# Patient Record
Sex: Female | Born: 1980 | Race: Black or African American | Hispanic: No | Marital: Single | Smoking: Former smoker
Health system: Southern US, Community
[De-identification: ages and names within clinical notes are randomized; demographics above are authoritative.]

## PROBLEM LIST (undated history)

## (undated) ENCOUNTER — Inpatient Hospital Stay (HOSPITAL_COMMUNITY): Payer: Self-pay

## (undated) ENCOUNTER — Ambulatory Visit (HOSPITAL_COMMUNITY): Admission: EM | Payer: Medicaid Other | Source: Home / Self Care

## (undated) DIAGNOSIS — A749 Chlamydial infection, unspecified: Secondary | ICD-10-CM

## (undated) DIAGNOSIS — Z8619 Personal history of other infectious and parasitic diseases: Secondary | ICD-10-CM

## (undated) DIAGNOSIS — N200 Calculus of kidney: Secondary | ICD-10-CM

## (undated) DIAGNOSIS — O139 Gestational [pregnancy-induced] hypertension without significant proteinuria, unspecified trimester: Secondary | ICD-10-CM

## (undated) DIAGNOSIS — N871 Moderate cervical dysplasia: Secondary | ICD-10-CM

## (undated) DIAGNOSIS — IMO0002 Reserved for concepts with insufficient information to code with codable children: Secondary | ICD-10-CM

## (undated) DIAGNOSIS — F419 Anxiety disorder, unspecified: Secondary | ICD-10-CM

## (undated) DIAGNOSIS — K219 Gastro-esophageal reflux disease without esophagitis: Secondary | ICD-10-CM

## (undated) DIAGNOSIS — A5901 Trichomonal vulvovaginitis: Secondary | ICD-10-CM

## (undated) DIAGNOSIS — D649 Anemia, unspecified: Secondary | ICD-10-CM

## (undated) DIAGNOSIS — A599 Trichomoniasis, unspecified: Secondary | ICD-10-CM

## (undated) DIAGNOSIS — N390 Urinary tract infection, site not specified: Secondary | ICD-10-CM

## (undated) HISTORY — PX: INDUCED ABORTION: SHX677

## (undated) HISTORY — PX: COLPOSCOPY: SHX161

## (undated) HISTORY — PX: WISDOM TOOTH EXTRACTION: SHX21

---
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
## 2001-02-06 ENCOUNTER — Emergency Department (HOSPITAL_COMMUNITY): Admission: EM | Admit: 2001-02-06 | Discharge: 2001-02-06 | Payer: Self-pay | Admitting: Emergency Medicine

## 2001-03-10 ENCOUNTER — Inpatient Hospital Stay (HOSPITAL_COMMUNITY): Admission: AD | Admit: 2001-03-10 | Discharge: 2001-03-10 | Payer: Self-pay | Admitting: *Deleted

## 2001-03-19 ENCOUNTER — Inpatient Hospital Stay (HOSPITAL_COMMUNITY): Admission: AD | Admit: 2001-03-19 | Discharge: 2001-03-19 | Payer: Self-pay | Admitting: *Deleted

## 2001-07-01 ENCOUNTER — Inpatient Hospital Stay (HOSPITAL_COMMUNITY): Admission: AD | Admit: 2001-07-01 | Discharge: 2001-07-01 | Payer: Self-pay | Admitting: Obstetrics and Gynecology

## 2001-07-16 ENCOUNTER — Inpatient Hospital Stay (HOSPITAL_COMMUNITY): Admission: AD | Admit: 2001-07-16 | Discharge: 2001-07-16 | Payer: Self-pay | Admitting: *Deleted

## 2001-10-14 ENCOUNTER — Inpatient Hospital Stay (HOSPITAL_COMMUNITY): Admission: AD | Admit: 2001-10-14 | Discharge: 2001-10-14 | Payer: Self-pay | Admitting: *Deleted

## 2002-01-22 ENCOUNTER — Inpatient Hospital Stay (HOSPITAL_COMMUNITY): Admission: AD | Admit: 2002-01-22 | Discharge: 2002-01-22 | Payer: Self-pay | Admitting: Obstetrics and Gynecology

## 2002-03-20 ENCOUNTER — Inpatient Hospital Stay (HOSPITAL_COMMUNITY): Admission: AD | Admit: 2002-03-20 | Discharge: 2002-03-20 | Payer: Self-pay | Admitting: Family Medicine

## 2002-10-24 ENCOUNTER — Inpatient Hospital Stay (HOSPITAL_COMMUNITY): Admission: AD | Admit: 2002-10-24 | Discharge: 2002-10-24 | Payer: Self-pay | Admitting: Family Medicine

## 2002-10-26 ENCOUNTER — Inpatient Hospital Stay (HOSPITAL_COMMUNITY): Admission: AD | Admit: 2002-10-26 | Discharge: 2002-10-26 | Payer: Self-pay | Admitting: *Deleted

## 2002-10-28 ENCOUNTER — Inpatient Hospital Stay (HOSPITAL_COMMUNITY): Admission: AD | Admit: 2002-10-28 | Discharge: 2002-10-28 | Payer: Self-pay | Admitting: *Deleted

## 2003-03-26 ENCOUNTER — Emergency Department (HOSPITAL_COMMUNITY): Admission: EM | Admit: 2003-03-26 | Discharge: 2003-03-26 | Payer: Self-pay | Admitting: Emergency Medicine

## 2003-08-03 ENCOUNTER — Inpatient Hospital Stay (HOSPITAL_COMMUNITY): Admission: AD | Admit: 2003-08-03 | Discharge: 2003-08-03 | Payer: Self-pay | Admitting: Obstetrics and Gynecology

## 2004-06-27 ENCOUNTER — Inpatient Hospital Stay (HOSPITAL_COMMUNITY): Admission: AD | Admit: 2004-06-27 | Discharge: 2004-06-28 | Payer: Self-pay | Admitting: Obstetrics and Gynecology

## 2004-06-30 ENCOUNTER — Ambulatory Visit: Payer: Self-pay | Admitting: Obstetrics & Gynecology

## 2004-07-04 ENCOUNTER — Ambulatory Visit: Payer: Self-pay | Admitting: Family Medicine

## 2004-07-04 ENCOUNTER — Inpatient Hospital Stay (HOSPITAL_COMMUNITY): Admission: AD | Admit: 2004-07-04 | Discharge: 2004-07-06 | Payer: Self-pay | Admitting: *Deleted

## 2005-10-27 ENCOUNTER — Ambulatory Visit: Payer: Self-pay | Admitting: Gynecology

## 2005-10-27 ENCOUNTER — Inpatient Hospital Stay (HOSPITAL_COMMUNITY): Admission: AD | Admit: 2005-10-27 | Discharge: 2005-10-28 | Payer: Self-pay | Admitting: Gynecology

## 2005-10-28 ENCOUNTER — Ambulatory Visit: Payer: Self-pay | Admitting: Gynecology

## 2005-10-28 ENCOUNTER — Inpatient Hospital Stay (HOSPITAL_COMMUNITY): Admission: AD | Admit: 2005-10-28 | Discharge: 2005-10-30 | Payer: Self-pay | Admitting: Gynecology

## 2010-08-22 ENCOUNTER — Inpatient Hospital Stay (HOSPITAL_COMMUNITY)
Admission: AD | Admit: 2010-08-22 | Discharge: 2010-08-22 | Disposition: A | Discharge status: Home Health | Payer: Self-pay | Source: Ambulatory Visit | Attending: Obstetrics & Gynecology | Admitting: Obstetrics & Gynecology

## 2010-08-22 ENCOUNTER — Encounter (HOSPITAL_COMMUNITY): Payer: Self-pay | Admitting: *Deleted

## 2010-08-22 ENCOUNTER — Inpatient Hospital Stay (HOSPITAL_COMMUNITY): Payer: Self-pay

## 2010-08-22 DIAGNOSIS — N200 Calculus of kidney: Secondary | ICD-10-CM

## 2010-08-22 DIAGNOSIS — R10A1 Flank pain, right side: Secondary | ICD-10-CM

## 2010-08-22 DIAGNOSIS — R1031 Right lower quadrant pain: Secondary | ICD-10-CM

## 2010-08-22 DIAGNOSIS — R109 Unspecified abdominal pain: Secondary | ICD-10-CM

## 2010-08-22 DIAGNOSIS — N923 Ovulation bleeding: Secondary | ICD-10-CM

## 2010-08-22 DIAGNOSIS — N949 Unspecified condition associated with female genital organs and menstrual cycle: Secondary | ICD-10-CM | POA: Insufficient documentation

## 2010-08-22 DIAGNOSIS — N920 Excessive and frequent menstruation with regular cycle: Secondary | ICD-10-CM

## 2010-08-22 DIAGNOSIS — N938 Other specified abnormal uterine and vaginal bleeding: Secondary | ICD-10-CM | POA: Insufficient documentation

## 2010-08-22 HISTORY — DX: Calculus of kidney: N20.0

## 2010-08-22 LAB — URINALYSIS, ROUTINE W REFLEX MICROSCOPIC
Glucose, UA: NEGATIVE mg/dL
Ketones, ur: NEGATIVE mg/dL
Leukocytes, UA: NEGATIVE
Nitrite: NEGATIVE
Protein, ur: NEGATIVE mg/dL
pH: 7 (ref 5.0–8.0)

## 2010-08-22 LAB — WET PREP, GENITAL: Trich, Wet Prep: NONE SEEN

## 2010-08-22 LAB — URINE MICROSCOPIC-ADD ON

## 2010-08-22 LAB — POCT PREGNANCY, URINE: Preg Test, Ur: NEGATIVE

## 2010-08-22 MED ORDER — OXYCODONE-ACETAMINOPHEN 5-325 MG PO TABS: 1.0000 | ORAL_TABLET | ORAL | Status: AC | PRN

## 2010-08-22 MED ORDER — IBUPROFEN 600 MG PO TABS: 600.0000 mg | ORAL_TABLET | Freq: Four times a day (QID) | ORAL | Status: AC | PRN

## 2010-08-22 MED ORDER — IBUPROFEN 800 MG PO TABS
800.0000 mg | ORAL_TABLET | Freq: Once | ORAL | Status: AC
  Administered 2010-08-22: 800 mg via ORAL
  Filled 2010-08-22: qty 1

## 2010-08-22 NOTE — Progress Notes (Deleted)
Been having really bad abd pain since around 5pm yesterday. Constant. Sharp. Thinks she is 11 wks preg.  [facility])  G1

## 2010-08-22 NOTE — ED Notes (Signed)
Neg CVA tenderness. 

## 2010-08-22 NOTE — Progress Notes (Signed)
Came off period, has been spotting.  Has low back pain and is peeing alot

## 2010-08-22 NOTE — Progress Notes (Signed)
Nauseous in the morning

## 2010-08-22 NOTE — ED Provider Notes (Signed)
History   The pt is a 30 year-old female who presents to [facility] reporting intermenstrual spotting for the second consecutive month and right flank pain, hematuria, frequency and low adb pain x a few days.  She rates the pain 6/10 at it's worst and is requesting pain medication. She has a Hx of Kidney stones and LEEP. Last Pap ~1 year ago normal per pt. She is sexually active, denies vaginal discharge other that the spotting and denies fever, chills.  CSN: [id number] Arrival date & time: 08/22/2010  3:59 PM  Chief Complaint  Patient presents with  . Vaginal Bleeding   HPI  Past Medical History  Diagnosis Date  . Kidney stone     Past Surgical History  Procedure Date  . No past surgeries     No family history on file.  History  Substance Use Topics  . Smoking status: Never Smoker   . Smokeless tobacco: Not on file  . Alcohol Use: Yes    OB History    Grav Para Term Preterm Abortions TAB SAB Ect Mult Living   5 3 0 3 2 1  1         Review of Systems  All other systems reviewed and are negative.    Physical Exam  BP 135/79  Pulse 89  Temp(Src) 97.9 F (36.6 C) (Oral)  Resp 20  Ht 5\' 7"  (1.702 m)  Wt 80.74 kg (178 lb)  BMI 27.88 kg/m2  Physical Exam  Constitutional: She is oriented to person, place, and time. She appears well-developed and well-nourished. No distress.  Cardiovascular: Normal rate.   Pulmonary/Chest: Effort normal.  Abdominal: Soft. Bowel sounds are normal. She exhibits no distension and no mass. There is tenderness (mild, generalized). There is no rebound, no guarding and no CVA tenderness.  Genitourinary: Uterus is not enlarged and not tender. Cervix exhibits no motion tenderness, no discharge and no friability. Right adnexum displays no mass, no tenderness and no fullness. Left adnexum displays no mass, no tenderness and no fullness. There is bleeding (scant pink) around the vagina. No erythema or tenderness around the vagina. No vaginal discharge  found.  Neurological: She is alert and oriented to person, place, and time.  Skin: Skin is warm and dry.  Psychiatric: She has a normal mood and affect.   Results for orders placed during the hospital encounter of 08/22/10 (from the past 48 hour(s))  URINALYSIS, ROUTINE W REFLEX MICROSCOPIC     Status: Abnormal   Collection Time   08/22/10  5:07 PM      Component Value Range Comment   Color, Urine YELLOW  YELLOW     Appearance HAZY (*) CLEAR     Specific Gravity, Urine 1.020  1.005 - 1.030     pH 7.0  5.0 - 8.0     Glucose, UA NEGATIVE  NEGATIVE (mg/dL)    Hgb urine dipstick LARGE (*) NEGATIVE     Bilirubin Urine NEGATIVE  NEGATIVE     Ketones, ur NEGATIVE  NEGATIVE (mg/dL)    Protein, ur NEGATIVE  NEGATIVE (mg/dL)    Urobilinogen, UA 1.0  0.0 - 1.0 (mg/dL)    Nitrite NEGATIVE  NEGATIVE     Leukocytes, UA NEGATIVE  NEGATIVE    URINE MICROSCOPIC-ADD ON     Status: Abnormal   Collection Time   08/22/10  5:07 PM      Component Value Range Comment   Squamous Epithelial / LPF FEW (*) RARE     WBC, UA  0-2  <3 (WBC/hpf)    Urine-Other AMORPHOUS URATES/PHOSPHATES     POCT PREGNANCY, URINE     Status: Normal   Collection Time   08/22/10  5:13 PM      Component Value Range Comment   Preg Test, Ur NEGATIVE     POCT PREGNANCY, URINE     Status: Normal   Collection Time   08/22/10  7:46 PM      Component Value Range Comment   Preg Test, Ur NEGATIVE     WET PREP, GENITAL     Status: Abnormal   Collection Time   08/22/10  8:05 PM      Component Value Range Comment   Yeast, Wet Prep NONE SEEN  NONE SEEN     Trich, Wet Prep NONE SEEN  NONE SEEN     Clue Cells, Wet Prep FEW (*) NONE SEEN     WBC, Wet Prep HPF POC RARE (*) NONE SEEN  FEW BACTERIA SEEN  Pelvic US: Normal Renal US: No stone or hydronephrosis. No right ureteral jet seen. Otherwise normal.  ED Course  Procedures Assessment: 1. Possible kidney stones vs UTI 2. Spotting of unk etiology  Plan: 1. Urine Culture 2. D/C home  per consult w/ Dr. Penne Lash 3. Strain Urine 4. Rx Ibuprofen 600 PO Q6 PRN and Percocet 5/325 1 PO Q4 PRN #20 5. F/u at [facility] for severe flank pain, fever, chills 6. Increase water intake 7. Initiate care w/ gyn provider to continue eval of spotting. List given.

## 2010-08-22 NOTE — Progress Notes (Signed)
C/o right flank pain that is constant, has a hx of kidney stones.

## 2010-08-23 LAB — URINE CULTURE: Colony Count: 50000

## 2010-08-23 LAB — GC/CHLAMYDIA PROBE AMP, GENITAL
Chlamydia, DNA Probe: NEGATIVE
GC Probe Amp, Genital: NEGATIVE

## 2010-08-23 NOTE — ED Provider Notes (Signed)
Agree with above note.  Carmen Johnston H. 08/23/2010 6:32 AM

## 2010-12-02 ENCOUNTER — Encounter (HOSPITAL_COMMUNITY): Payer: Self-pay | Admitting: *Deleted

## 2010-12-02 ENCOUNTER — Emergency Department (HOSPITAL_COMMUNITY)
Admission: EM | Admit: 2010-12-02 | Discharge: 2010-12-03 | Disposition: A | Discharge status: Home / Self Care | Payer: Self-pay | Attending: Emergency Medicine | Admitting: Emergency Medicine

## 2010-12-02 DIAGNOSIS — IMO0001 Reserved for inherently not codable concepts without codable children: Secondary | ICD-10-CM | POA: Insufficient documentation

## 2010-12-02 DIAGNOSIS — R509 Fever, unspecified: Secondary | ICD-10-CM | POA: Insufficient documentation

## 2010-12-02 DIAGNOSIS — R6889 Other general symptoms and signs: Secondary | ICD-10-CM | POA: Insufficient documentation

## 2010-12-02 DIAGNOSIS — R05 Cough: Secondary | ICD-10-CM | POA: Insufficient documentation

## 2010-12-02 DIAGNOSIS — R51 Headache: Secondary | ICD-10-CM | POA: Insufficient documentation

## 2010-12-02 DIAGNOSIS — R062 Wheezing: Secondary | ICD-10-CM | POA: Insufficient documentation

## 2010-12-02 DIAGNOSIS — R059 Cough, unspecified: Secondary | ICD-10-CM | POA: Insufficient documentation

## 2010-12-02 DIAGNOSIS — R070 Pain in throat: Secondary | ICD-10-CM | POA: Insufficient documentation

## 2010-12-02 DIAGNOSIS — R110 Nausea: Secondary | ICD-10-CM | POA: Insufficient documentation

## 2010-12-02 NOTE — ED Notes (Signed)
The pt has had chills elevated temp headache and body aches since yesterday

## 2010-12-03 MED ORDER — IBUPROFEN 800 MG PO TABS: 800.0000 mg | ORAL_TABLET | Freq: Once | ORAL | Status: DC

## 2010-12-03 NOTE — ED Provider Notes (Signed)
Medical screening examination/treatment/procedure(s) were performed by non-physician practitioner and as supervising physician I was immediately available for consultation/collaboration.  Doug Sou, MD 12/03/10 [phone]

## 2010-12-03 NOTE — ED Notes (Signed)
Pt states she is one month pregnant. She states she doesn't want to take ibuprofen because she just took nyquil in her room. ARNP notified.

## 2010-12-03 NOTE — ED Provider Notes (Signed)
History     CSN: [id number] Arrival date & time: 12/02/2010 11:41 PM   First MD Initiated Contact with Patient 12/03/10 0138      Chief Complaint  Patient presents with  . Fever    (Consider location/radiation/quality/duration/timing/severity/associated sxs/prior treatment) HPI Comments: Acute onset yesterday of myalgias, headache, sore throat, runny nose, low-grade fever.  Denies nausea or vomiting, diarrhea, dysuria  Patient is a 30 y.o. female presenting with fever. The history is provided by the patient.  Fever Primary symptoms of the febrile illness include fever, headaches, cough, wheezing, nausea and myalgias. Primary symptoms do not include shortness of breath, vomiting, diarrhea or dysuria. The current episode started yesterday. This is a new problem. The problem has not changed since onset.   Past Medical History  Diagnosis Date  . Kidney stone     Past Surgical History  Procedure Date  . No past surgeries     History reviewed. No pertinent family history.  History  Substance Use Topics  . Smoking status: Never Smoker   . Smokeless tobacco: Not on file  . Alcohol Use: Yes    OB History    Grav Para Term Preterm Abortions TAB SAB Ect Mult Living   5 3 0 3 2 1  1         Review of Systems  Constitutional: Positive for fever.  HENT: Negative for congestion and rhinorrhea.   Eyes: Negative.   Respiratory: Positive for cough and wheezing. Negative for shortness of breath.   Gastrointestinal: Positive for nausea. Negative for vomiting and diarrhea.  Genitourinary: Negative.  Negative for dysuria.  Musculoskeletal: Positive for myalgias.  Skin: Negative.   Neurological: Positive for headaches.  Hematological: Negative.   Psychiatric/Behavioral: Negative.     Allergies  Review of patient's allergies indicates no known allergies.  Home Medications   Current Outpatient Rx  Name Route Sig Dispense Refill  . PSEUDOEPH-DOXYLAMINE-DM-APAP [id number]  MG/30ML PO LIQD Oral Take 15 mLs by mouth every 6 (six) hours as needed. For cold symptoms       BP 133/85  Pulse 96  Temp(Src) 101.3 F (38.5 C) (Oral)  Resp 19  SpO2 98%  LMP 11/01/2010  Physical Exam  Constitutional: She is oriented to person, place, and time. She appears well-developed and well-nourished.  HENT:  Head: Normocephalic.  Eyes: EOM are normal.  Neck: Neck supple. No tracheal deviation present.  Cardiovascular: Tachycardia present.   Pulmonary/Chest: Effort normal.  Abdominal: Bowel sounds are normal.  Musculoskeletal: Normal range of motion.  Neurological: She is oriented to person, place, and time.  Skin: Skin is warm and dry.  Psychiatric: She has a normal mood and affect.    ED Course  Procedures (including critical care time)  Labs Reviewed - No data to display No results found.   1. Flu-like symptoms       MDM  Future acute onset of symptoms.  This is most likely a flulike illness at time of discharge.  Patient informs me she is [redacted] weeks pregnant.        Arman Filter, NP 12/03/10 [id number]  Arman Filter, NP 12/03/10 [phone]

## 2011-01-03 NOTE — L&D Delivery Note (Signed)
Delivery Note At 2:48 PM a viable and healthy female was delivered via  (Presentation: occiput anterior).  APGAR: pending ; weight  6lb 5oz.   Placenta status: intact.  Cord: 3 vessles with the following complications: loose nuchal.    Anesthesia:  epidural Episiotomy: none Lacerations: small left and right labial Suture Repair: none required Est. Blood Loss (mL): 250  Mom to postpartum.  Baby to nursery-stable.  Marikay Alar 07/20/2011, 3:11 PM   I was present for delivery and agree with note above. [facility]

## 2011-01-04 ENCOUNTER — Encounter (HOSPITAL_COMMUNITY): Payer: Self-pay | Admitting: *Deleted

## 2011-01-04 ENCOUNTER — Inpatient Hospital Stay (HOSPITAL_COMMUNITY)
Admission: AD | Admit: 2011-01-04 | Discharge: 2011-01-04 | Disposition: A | Discharge status: Home / Self Care | Payer: Medicaid Other | Source: Ambulatory Visit | Attending: Obstetrics and Gynecology | Admitting: Obstetrics and Gynecology

## 2011-01-04 DIAGNOSIS — O26899 Other specified pregnancy related conditions, unspecified trimester: Secondary | ICD-10-CM

## 2011-01-04 DIAGNOSIS — A499 Bacterial infection, unspecified: Secondary | ICD-10-CM | POA: Insufficient documentation

## 2011-01-04 DIAGNOSIS — B9689 Other specified bacterial agents as the cause of diseases classified elsewhere: Secondary | ICD-10-CM | POA: Insufficient documentation

## 2011-01-04 DIAGNOSIS — O99619 Diseases of the digestive system complicating pregnancy, unspecified trimester: Secondary | ICD-10-CM

## 2011-01-04 DIAGNOSIS — R109 Unspecified abdominal pain: Secondary | ICD-10-CM

## 2011-01-04 DIAGNOSIS — M549 Dorsalgia, unspecified: Secondary | ICD-10-CM | POA: Insufficient documentation

## 2011-01-04 DIAGNOSIS — N760 Acute vaginitis: Secondary | ICD-10-CM | POA: Insufficient documentation

## 2011-01-04 DIAGNOSIS — O2390 Unspecified genitourinary tract infection in pregnancy, unspecified trimester: Secondary | ICD-10-CM | POA: Insufficient documentation

## 2011-01-04 HISTORY — DX: Reserved for concepts with insufficient information to code with codable children: IMO0002

## 2011-01-04 HISTORY — DX: Trichomoniasis, unspecified: A59.9

## 2011-01-04 HISTORY — DX: Urinary tract infection, site not specified: N39.0

## 2011-01-04 HISTORY — DX: Gestational (pregnancy-induced) hypertension without significant proteinuria, unspecified trimester: O13.9

## 2011-01-04 HISTORY — DX: Chlamydial infection, unspecified: A74.9

## 2011-01-04 HISTORY — DX: Anemia, unspecified: D64.9

## 2011-01-04 LAB — CBC
HCT: 34.6 % — ABNORMAL LOW (ref 36.0–46.0)
Hemoglobin: 10.9 g/dL — ABNORMAL LOW (ref 12.0–15.0)
MCV: 83.4 fL (ref 78.0–100.0)
RBC: 4.15 MIL/uL (ref 3.87–5.11)
WBC: 7.4 10*3/uL (ref 4.0–10.5)

## 2011-01-04 LAB — WET PREP, GENITAL
Trich, Wet Prep: NONE SEEN
Yeast Wet Prep HPF POC: NONE SEEN

## 2011-01-04 LAB — URINALYSIS, ROUTINE W REFLEX MICROSCOPIC
Glucose, UA: NEGATIVE mg/dL
Leukocytes, UA: NEGATIVE
Protein, ur: NEGATIVE mg/dL
pH: 7 (ref 5.0–8.0)

## 2011-01-04 LAB — URINE MICROSCOPIC-ADD ON

## 2011-01-04 MED ORDER — METRONIDAZOLE 500 MG PO TABS: 500.0000 mg | ORAL_TABLET | Freq: Two times a day (BID) | ORAL | Status: AC

## 2011-01-04 MED ORDER — ACETAMINOPHEN 325 MG PO TABS
650.0000 mg | ORAL_TABLET | Freq: Once | ORAL | Status: AC
  Administered 2011-01-04: 650 mg via ORAL
  Filled 2011-01-04: qty 2

## 2011-01-04 NOTE — ED Provider Notes (Signed)
History     Chief Complaint  Patient presents with  . Abdominal Pain   HPI Carmen Johnston 31 y.o. 12w 0d gestation.  Comes to [facility]U with soreness in right side and low back pain.  [facility]  No vomiting, no vaginal bleeding.   OB History    Grav Para Term Preterm Abortions TAB SAB Ect Mult Living   7 3 3  0 3 1 1 1  2       Past Medical History  Diagnosis Date  . Kidney stone   . Pregnancy induced hypertension   . Anemia   . Urinary tract infection   . Abnormal Pap smear   . Chlamydia   . Trichomonas     Past Surgical History  Procedure Date  . Induced abortion   . Colposcopy     Family History  Problem Relation Age of Onset  . Anesthesia problems Neg Hx     History  Substance Use Topics  . Smoking status: Current Everyday Smoker -- 0.2 packs/day for 10 years    Types: Cigarettes  . Smokeless tobacco: Never Used  . Alcohol Use: Yes    Allergies: No Known Allergies  Prescriptions prior to admission  Medication Sig Dispense Refill  . acetaminophen (TYLENOL) 500 MG tablet Take 1,000 mg by mouth every 6 (six) hours as needed. Takes for pain       . Prenatal Vit-Fe Fumarate-FA (PRENATAL MULTIVITAMIN) TABS Take 1 tablet by mouth daily.          Review of Systems  Gastrointestinal: Positive for abdominal pain. Negative for vomiting and diarrhea.  Musculoskeletal: Positive for back pain.   Physical Exam   Blood pressure 117/84, pulse 88, temperature 99 F (37.2 C), temperature source Oral, resp. rate 20, height 5\' 9"  (1.753 m), weight 193 lb (87.544 kg), last menstrual period 10/12/2010.  Physical Exam  Nursing note and vitals reviewed. Constitutional: She is oriented to person, place, and time. She appears well-developed and well-nourished.  HENT:  Head: Normocephalic.  Eyes: EOM are normal.  Neck: Neck supple.  GI: Soft. There is no tenderness. There is no rebound and no guarding.       FHT 166    Genitourinary:       Speculum exam: Vagina - Small amount of creamy discharge, some odor Cervix - No contact bleeding Bimanual exam: Cervix closed Uterus non tender, 12 weeks size Adnexa non tender, no masses bilaterally GC/Chlam, wet prep done Chaperone present for exam.  Musculoskeletal: Normal range of motion.  Neurological: She is alert and oriented to person, place, and time.  Skin: Skin is warm and dry.  Psychiatric: She has a normal mood and affect.    MAU Course  Procedures  MDM Results for orders placed during the hospital encounter of 01/04/11 (from the past 24 hour(s))  URINALYSIS, ROUTINE W REFLEX MICROSCOPIC     Status: Abnormal   Collection Time   01/04/11 10:25 AM      Component Value Range   Color, Urine YELLOW  YELLOW    APPearance HAZY (*) CLEAR    Specific Gravity, Urine 1.020  1.005 - 1.030    pH 7.0  5.0 - 8.0    Glucose, UA NEGATIVE  NEGATIVE (mg/dL)   Hgb urine dipstick TRACE (*) NEGATIVE    Bilirubin Urine NEGATIVE  NEGATIVE    Ketones, ur NEGATIVE  NEGATIVE (mg/dL)   Protein, ur NEGATIVE  NEGATIVE (mg/dL)  Urobilinogen, UA 0.2  0.0 - 1.0 (mg/dL)   Nitrite NEGATIVE  NEGATIVE    Leukocytes, UA NEGATIVE  NEGATIVE   URINE MICROSCOPIC-ADD ON     Status: Abnormal   Collection Time   01/04/11 10:25 AM      Component Value Range   Squamous Epithelial / LPF MANY (*) RARE    WBC, UA 0-2  <3 (WBC/hpf)   RBC / HPF 0-2  <3 (RBC/hpf)   Bacteria, UA MANY (*) RARE   CBC     Status: Abnormal   Collection Time   01/04/11 12:00 PM      Component Value Range   WBC 7.4  4.0 - 10.5 (K/uL)   RBC 4.15  3.87 - 5.11 (MIL/uL)   Hemoglobin 10.9 (*) 12.0 - 15.0 (g/dL)   HCT 40.9 (*) 81.1 - 46.0 (%)   MCV 83.4  78.0 - 100.0 (fL)   MCH 26.3  26.0 - 34.0 (pg)   MCHC 31.5  30.0 - 36.0 (g/dL)   RDW 91.4  78.2 - 95.6 (%)   Platelets 230  150 - 400 (K/uL)  WET PREP, GENITAL     Status: Abnormal   Collection Time   01/04/11 12:28 PM      Component Value Range   Yeast, Wet  Prep NONE SEEN  NONE SEEN    Trich, Wet Prep NONE SEEN  NONE SEEN    Clue Cells, Wet Prep MODERATE (*) NONE SEEN    WBC, Wet Prep HPF POC FEW (*) NONE SEEN      Assessment and Plan  Pregnancy 12 weeks Bacterial vaginosis Back pain - likely muscle strain  Plan rx metronidazole 500 mg One po bid x 7 days Keep scheduled appointment to begin prenatal care Eat high fiber diet and increase PO fluids. Take Tylenol 325 mg 2 tablets by mouth every 4 hours if needed for pain.    Marga Gramajo 01/04/2011, 11:49 AM   Nolene Bernheim, NP 01/04/11 1321

## 2011-01-04 NOTE — Progress Notes (Addendum)
Sharp pain in RLQ and around to back.  Started a couple days ago.  Denies any urinary problems.  [facility]

## 2011-01-04 NOTE — ED Provider Notes (Signed)
Agree with above note.  Carmen Johnston 01/04/2011 3:52 PM

## 2011-01-05 LAB — GC/CHLAMYDIA PROBE AMP, GENITAL: Chlamydia, DNA Probe: NEGATIVE

## 2011-02-15 ENCOUNTER — Inpatient Hospital Stay (HOSPITAL_COMMUNITY)
Admission: AD | Admit: 2011-02-15 | Discharge: 2011-02-15 | Disposition: A | Discharge status: Home / Self Care | Payer: Medicaid Other | Source: Ambulatory Visit | Attending: Obstetrics and Gynecology | Admitting: Obstetrics and Gynecology

## 2011-02-15 ENCOUNTER — Encounter (HOSPITAL_COMMUNITY): Payer: Self-pay | Admitting: *Deleted

## 2011-02-15 DIAGNOSIS — O99891 Other specified diseases and conditions complicating pregnancy: Secondary | ICD-10-CM | POA: Insufficient documentation

## 2011-02-15 DIAGNOSIS — R109 Unspecified abdominal pain: Secondary | ICD-10-CM | POA: Insufficient documentation

## 2011-02-15 DIAGNOSIS — N949 Unspecified condition associated with female genital organs and menstrual cycle: Secondary | ICD-10-CM

## 2011-02-15 LAB — URINALYSIS, ROUTINE W REFLEX MICROSCOPIC
Bilirubin Urine: NEGATIVE
Glucose, UA: NEGATIVE mg/dL
Nitrite: NEGATIVE
Specific Gravity, Urine: 1.025 (ref 1.005–1.030)
pH: 6 (ref 5.0–8.0)

## 2011-02-15 LAB — WET PREP, GENITAL
Trich, Wet Prep: NONE SEEN
WBC, Wet Prep HPF POC: NONE SEEN

## 2011-02-15 LAB — URINE MICROSCOPIC-ADD ON

## 2011-02-15 NOTE — Progress Notes (Signed)
Patient states she is unsure of her last period. Has been having upper and lower right abdominal pain, nausea and vomiting with no vomiting today, and diarrhea for several days. States she had a positive pregnancy test at [facility]. No bleeding or discharge.

## 2011-02-15 NOTE — ED Provider Notes (Signed)
History   Pt presents today c/o lower abd pain and cramping that comes and goes. She states the pain is worse when she moves. At rest, the pain improves. She denies vag dc, bleeding, dysuria, fever, or any other sx. She originally states she had diarrhea, but further questioning demonstrated that she is have soft but form stools.  Chief Complaint  Patient presents with  . Abdominal Pain  . Diarrhea   HPI  OB History    Grav Para Term Preterm Abortions TAB SAB Ect Mult Living   7 3 3  0 3 1 1 1  2       Past Medical History  Diagnosis Date  . Kidney stone   . Pregnancy induced hypertension   . Anemia   . Urinary tract infection   . Abnormal Pap smear   . Chlamydia   . Trichomonas     Past Surgical History  Procedure Date  . Induced abortion   . Colposcopy     Family History  Problem Relation Age of Onset  . Anesthesia problems Neg Hx   . Mental illness Mother     History  Substance Use Topics  . Smoking status: Current Everyday Smoker -- 0.2 packs/day for 10 years    Types: Cigarettes  . Smokeless tobacco: Never Used  . Alcohol Use: Yes    Allergies: No Known Allergies  Prescriptions prior to admission  Medication Sig Dispense Refill  . aspirin 325 MG tablet Take 325 mg by mouth daily as needed. For headaches.      . Iron 66 MG TABS Take 1 tablet by mouth daily as needed.      . Prenatal Vit-Fe Fumarate-FA (PRENATAL MULTIVITAMIN) TABS Take 1 tablet by mouth daily.          Review of Systems  Constitutional: Negative for fever and chills.  Eyes: Negative for blurred vision and double vision.  Respiratory: Negative for cough, hemoptysis, sputum production, shortness of breath and wheezing.   Cardiovascular: Negative for chest pain and palpitations.  Gastrointestinal: Positive for abdominal pain. Negative for nausea, vomiting, diarrhea, constipation and blood in stool.  Genitourinary: Negative for dysuria, urgency, frequency, hematuria and flank pain.    Neurological: Negative for dizziness and headaches.  Psychiatric/Behavioral: Negative for depression and suicidal ideas.   Physical Exam   Blood pressure 117/74, pulse 77, temperature 98.4 F (36.9 C), temperature source Oral, resp. rate 16, height 5\' 7"  (1.702 m), weight 203 lb 3.2 oz (92.171 kg), last menstrual period 10/12/2010, SpO2 99.00%.  Physical Exam  Nursing note and vitals reviewed. Constitutional: She is oriented to person, place, and time. She appears well-developed and well-nourished. No distress.  HENT:  Head: Normocephalic and atraumatic.  Eyes: EOM are normal. Pupils are equal, round, and reactive to light.  GI: Soft. She exhibits no distension and no mass. There is no tenderness. There is no rebound and no guarding.  Genitourinary: No bleeding around the vagina. Vaginal discharge found.  Neurological: She is alert and oriented to person, place, and time.  Skin: Skin is warm and dry. She is not diaphoretic.  Psychiatric: She has a normal mood and affect. Her behavior is normal. Judgment and thought content normal.    MAU Course  Procedures  Wet prep done.  Results for orders placed during the hospital encounter of 02/15/11 (from the past 24 hour(s))  URINALYSIS, ROUTINE W REFLEX MICROSCOPIC     Status: Abnormal   Collection Time   02/15/11  6:35 PM  Component Value Range   Color, Urine YELLOW  YELLOW    APPearance CLEAR  CLEAR    Specific Gravity, Urine 1.025  1.005 - 1.030    pH 6.0  5.0 - 8.0    Glucose, UA NEGATIVE  NEGATIVE (mg/dL)   Hgb urine dipstick TRACE (*) NEGATIVE    Bilirubin Urine NEGATIVE  NEGATIVE    Ketones, ur NEGATIVE  NEGATIVE (mg/dL)   Protein, ur NEGATIVE  NEGATIVE (mg/dL)   Urobilinogen, UA 0.2  0.0 - 1.0 (mg/dL)   Nitrite NEGATIVE  NEGATIVE    Leukocytes, UA NEGATIVE  NEGATIVE   URINE MICROSCOPIC-ADD ON     Status: Abnormal   Collection Time   02/15/11  6:35 PM      Component Value Range   Squamous Epithelial / LPF FEW (*)  RARE    WBC, UA 3-6  <3 (WBC/hpf)   RBC / HPF 0-2  <3 (RBC/hpf)   Bacteria, UA RARE  RARE   WET PREP, GENITAL     Status: Abnormal   Collection Time   02/15/11  7:05 PM      Component Value Range   Yeast Wet Prep HPF POC NONE SEEN  NONE SEEN    Trich, Wet Prep NONE SEEN  NONE SEEN    Clue Cells Wet Prep HPF POC FEW (*) NONE SEEN    WBC, Wet Prep HPF POC NONE SEEN  NONE SEEN    Bedside US shows single IUP with cardiac activity. Assessment and Plan  Round ligament pain: discussed with pt at length. She is to begin prenatal care. Discussed diet, activity, risks, and precautions.  Clinton Gallant. Omnia Dollinger III, DrHSc, MPAS, PA-C  02/15/2011, 7:03 PM   Henrietta Hoover, PA 02/15/11 1956

## 2011-02-15 NOTE — Progress Notes (Signed)
Pt complains of constant pain in her lower abdomen that will not go away

## 2011-03-03 DIAGNOSIS — O479 False labor, unspecified: Secondary | ICD-10-CM

## 2011-03-15 ENCOUNTER — Other Ambulatory Visit (HOSPITAL_COMMUNITY): Payer: Self-pay | Admitting: Nurse Practitioner

## 2011-03-15 DIAGNOSIS — Z3689 Encounter for other specified antenatal screening: Secondary | ICD-10-CM

## 2011-03-15 LAB — OB RESULTS CONSOLE RUBELLA ANTIBODY, IGM: Rubella: NON-IMMUNE/NOT IMMUNE

## 2011-03-15 LAB — OB RESULTS CONSOLE ABO/RH: RH Type: POSITIVE

## 2011-03-15 LAB — OB RESULTS CONSOLE HIV ANTIBODY (ROUTINE TESTING): HIV: NONREACTIVE

## 2011-03-15 LAB — OB RESULTS CONSOLE HEPATITIS B SURFACE ANTIGEN: Hepatitis B Surface Ag: NEGATIVE

## 2011-03-21 ENCOUNTER — Ambulatory Visit (HOSPITAL_COMMUNITY)
Admission: RE | Admit: 2011-03-21 | Discharge: 2011-03-21 | Disposition: A | Discharge status: Home / Self Care | Payer: Medicaid Other | Source: Ambulatory Visit | Attending: Nurse Practitioner | Admitting: Nurse Practitioner

## 2011-03-21 DIAGNOSIS — O99330 Smoking (tobacco) complicating pregnancy, unspecified trimester: Secondary | ICD-10-CM | POA: Insufficient documentation

## 2011-03-21 DIAGNOSIS — Z363 Encounter for antenatal screening for malformations: Secondary | ICD-10-CM | POA: Insufficient documentation

## 2011-03-21 DIAGNOSIS — Z1389 Encounter for screening for other disorder: Secondary | ICD-10-CM | POA: Insufficient documentation

## 2011-03-21 DIAGNOSIS — O358XX0 Maternal care for other (suspected) fetal abnormality and damage, not applicable or unspecified: Secondary | ICD-10-CM | POA: Insufficient documentation

## 2011-03-21 DIAGNOSIS — Z3689 Encounter for other specified antenatal screening: Secondary | ICD-10-CM

## 2011-03-21 DIAGNOSIS — O0930 Supervision of pregnancy with insufficient antenatal care, unspecified trimester: Secondary | ICD-10-CM | POA: Insufficient documentation

## 2011-06-06 ENCOUNTER — Encounter (HOSPITAL_COMMUNITY): Payer: Self-pay | Admitting: *Deleted

## 2011-06-06 ENCOUNTER — Inpatient Hospital Stay (HOSPITAL_COMMUNITY)
Admission: AD | Admit: 2011-06-06 | Discharge: 2011-06-06 | Disposition: A | Discharge status: Home / Self Care | Payer: Medicaid Other | Attending: Obstetrics & Gynecology | Admitting: Obstetrics & Gynecology

## 2011-06-06 DIAGNOSIS — O479 False labor, unspecified: Secondary | ICD-10-CM

## 2011-06-06 DIAGNOSIS — O4700 False labor before 37 completed weeks of gestation, unspecified trimester: Secondary | ICD-10-CM | POA: Insufficient documentation

## 2011-06-06 LAB — URINALYSIS, ROUTINE W REFLEX MICROSCOPIC
Bilirubin Urine: NEGATIVE
Glucose, UA: NEGATIVE mg/dL
Protein, ur: NEGATIVE mg/dL
Specific Gravity, Urine: 1.02 (ref 1.005–1.030)

## 2011-06-06 LAB — URINE MICROSCOPIC-ADD ON

## 2011-06-06 MED ORDER — ACETAMINOPHEN 325 MG PO TABS: 650.0000 mg | ORAL_TABLET | Freq: Four times a day (QID) | ORAL | Status: DC | PRN

## 2011-06-06 NOTE — MAU Note (Signed)
Pt reports lower abd pain x 3 days, worsening today. Denies bleeding or gush of fluid. Reports frequency and dysuria.

## 2011-06-06 NOTE — MAU Provider Note (Signed)
  History    CSN: [id number] Arrival date and time: 06/06/11 2149  None  Chief Complaint  Patient presents with  . Contractions   HPI Comments: Pt presenting for labor eval.  Reports increased pressure in pelvis and B inguinal regions and back pain X 7 days.  Reports these feel different than prior contractions/braxton hicks.  No bleeding, no loss of fluids.  Baby is moving well.   Prenatal care at [facility] - late to care; [place] @ 22 weeks unremarkable. Rubella Non-immune but otherwise labs unremarkable.    OB History    Grav Para Term Preterm Abortions TAB SAB Ect Mult Living   7 3 3  0 3 1 1 1  3       Past Medical History  Diagnosis Date  . Kidney stone   . Pregnancy induced hypertension   . Anemia   . Urinary tract infection   . Abnormal Pap smear   . Chlamydia   . Trichomonas     Past Surgical History  Procedure Date  . Induced abortion   . Colposcopy     Family History  Problem Relation Age of Onset  . Anesthesia problems Neg Hx   . Mental illness Mother     History  Substance Use Topics  . Smoking status: Current Everyday Smoker -- 0.2 packs/day for 10 years    Types: Cigarettes  . Smokeless tobacco: Never Used  . Alcohol Use: No    Allergies: No Known Allergies  Prescriptions prior to admission  Medication Sig Dispense Refill  . aspirin 325 MG tablet Take 325 mg by mouth daily as needed. For headaches.      . Prenatal Vit-Fe Fumarate-FA (PRENATAL MULTIVITAMIN) TABS Take 1 tablet by mouth at bedtime.         Review of Systems  Constitutional: Negative for fever and chills.  Eyes: Negative for blurred vision and double vision.  Respiratory: Negative.  Negative for cough and wheezing.   Cardiovascular: Negative for chest pain.  Gastrointestinal: Negative for heartburn, nausea, vomiting and constipation.  Genitourinary: Negative for dysuria, urgency and frequency.  Musculoskeletal: Positive for back pain (associated with cramping pelvic pain).  Skin:  Negative.   Neurological: Negative.  Negative for headaches.  Endo/Heme/Allergies: Negative.   Psychiatric/Behavioral: Negative.    Physical Exam   Blood pressure 135/74, pulse 77, temperature 98.8 F (37.1 C), temperature source Oral, resp. rate 18, height 5\' 7"  (1.702 m), weight 101.152 kg (223 lb), last menstrual period 10/12/2010, SpO2 100.00%.  Physical Exam  Nursing note and vitals reviewed. Constitutional: She appears well-developed and well-nourished.  Cardiovascular: Normal rate and intact distal pulses.  Exam reveals no gallop and no friction rub.   No murmur heard. Respiratory: Effort normal. No respiratory distress.  GI: Soft. She exhibits distension and mass.  Skin: Skin is warm and dry. No rash noted. No erythema. No pallor.  Psychiatric: She has a normal mood and affect. Her behavior is normal. Judgment and thought content normal.  Dilation: Closed Effacement (%): Thick Cervical Position: Posterior Exam by:: Dr. Berline Chough MSK/OMT: R anterior Innominate.  R L5 rRsR  MAU Course  Procedures  MDM No contractions on monitor Category I tracing UA unremarkable Cervix Closed  Assessment and Plan  Braxton-Hicks Contractions OMT provided to lumbar and pelvis.  Andrena Mews, DO [facility] Resident - PGY-1 06/06/2011 11:18 PM

## 2011-06-06 NOTE — Discharge Instructions (Signed)
Please stop taking Aspirin and use Tylenol during pregnancy.  Please review the attached handout for more information regarding signs and symptoms of labor.    Abdominal Pain During Pregnancy Abdominal discomfort is common in pregnancy. Most of the time, it does not cause harm. There are many causes of abdominal pain. Some causes are more serious than others. Some of the causes of abdominal pain in pregnancy are easily diagnosed. Occasionally, the diagnosis takes time to understand. Other times, the cause is not determined. Abdominal pain can be a sign that something is very wrong with the pregnancy, or the pain may have nothing to do with the pregnancy at all. For this reason, always tell your caregiver if you have any abdominal discomfort. CAUSES Common and harmless causes of abdominal pain include:  Constipation.   Excess gas and bloating.   Round ligament pain. This is pain that is felt in the folds of the groin.   The position the baby or placenta is in.   Baby kicks.   Braxton-Hicks contractions. These are mild contractions that do not cause cervical dilation.  Serious causes of abdominal pain include:  Ectopic pregnancy. This happens when a fertilized egg implants outside of the uterus.   Miscarriage.   Preterm labor. This is when labor starts at less than 37 weeks of pregnancy.   Placental abruption. This is when the placenta partially or completely separates from the uterus.   Preeclampsia. This is often associated with high blood pressure and has been referred to as "toxemia in pregnancy."   Uterine or amniotic fluid infections.  Causes unrelated to pregnancy include:  Urinary tract infection.   Gallbladder stones or inflammation.   Hepatitis or other liver illness.   Intestinal problems, stomach flu, food poisoning, or ulcer.   Appendicitis.   Kidney (renal) stones.   Kidney infection (pylonephritis).  HOME CARE INSTRUCTIONS  For mild pain:  Do not have  sexual intercourse or put anything in your vagina until your symptoms go away completely.   Get plenty of rest until your pain improves. If your pain does not improve in 1 hour, call your caregiver.   Drink clear fluids if you feel nauseous. Avoid solid food as long as you are uncomfortable or nauseous.   Only take medicine as directed by your caregiver.   Keep all follow-up appointments with your caregiver.  SEEK IMMEDIATE MEDICAL CARE IF:  You are bleeding, leaking fluid, or passing tissue from the vagina.   You have increasing pain or cramping.   You have persistent vomiting.   You have painful or bloody urination.   You have a fever.   You notice a decrease in your baby's movements.   You have extreme weakness or feel faint.   You have shortness of breath, with or without abdominal pain.   You develop a severe headache with abdominal pain.   You have abnormal vaginal discharge with abdominal pain.   You have persistent diarrhea.   You have abdominal pain that continues even after rest, or gets worse.  MAKE SURE YOU:   Understand these instructions.   Will watch your condition.   Will get help right away if you are not doing well or get worse.  Document Released: 12/19/2004 Document Revised: 12/08/2010 Document Reviewed: 07/15/2010 [facility] Patient Information 2012 [place], [place].Abdominal Pain During Pregnancy Abdominal discomfort is common in pregnancy. Most of the time, it does not cause harm. There are many causes of abdominal pain. Some causes are more serious than  others. Some of the causes of abdominal pain in pregnancy are easily diagnosed. Occasionally, the diagnosis takes time to understand. Other times, the cause is not determined. Abdominal pain can be a sign that something is very wrong with the pregnancy, or the pain may have nothing to do with the pregnancy at all. For this reason, always tell your caregiver if you have any abdominal  discomfort. CAUSES Common and harmless causes of abdominal pain include:  Constipation.   Excess gas and bloating.   Round ligament pain. This is pain that is felt in the folds of the groin.   The position the baby or placenta is in.   Baby kicks.   Braxton-Hicks contractions. These are mild contractions that do not cause cervical dilation.  Serious causes of abdominal pain include:  Ectopic pregnancy. This happens when a fertilized egg implants outside of the uterus.   Miscarriage.   Preterm labor. This is when labor starts at less than 37 weeks of pregnancy.   Placental abruption. This is when the placenta partially or completely separates from the uterus.   Preeclampsia. This is often associated with high blood pressure and has been referred to as "toxemia in pregnancy."   Uterine or amniotic fluid infections.  Causes unrelated to pregnancy include:  Urinary tract infection.   Gallbladder stones or inflammation.   Hepatitis or other liver illness.   Intestinal problems, stomach flu, food poisoning, or ulcer.   Appendicitis.   Kidney (renal) stones.   Kidney infection (pylonephritis).  HOME CARE INSTRUCTIONS  For mild pain:  Do not have sexual intercourse or put anything in your vagina until your symptoms go away completely.   Get plenty of rest until your pain improves. If your pain does not improve in 1 hour, call your caregiver.   Drink clear fluids if you feel nauseous. Avoid solid food as long as you are uncomfortable or nauseous.   Only take medicine as directed by your caregiver.   Keep all follow-up appointments with your caregiver.  SEEK IMMEDIATE MEDICAL CARE IF:  You are bleeding, leaking fluid, or passing tissue from the vagina.   You have increasing pain or cramping.   You have persistent vomiting.   You have painful or bloody urination.   You have a fever.   You notice a decrease in your baby's movements.   You have extreme  weakness or feel faint.   You have shortness of breath, with or without abdominal pain.   You develop a severe headache with abdominal pain.   You have abnormal vaginal discharge with abdominal pain.   You have persistent diarrhea.   You have abdominal pain that continues even after rest, or gets worse.  MAKE SURE YOU:   Understand these instructions.   Will watch your condition.   Will get help right away if you are not doing well or get worse.  Document Released: 12/19/2004 Document Revised: 12/08/2010 Document Reviewed: 07/15/2010 [facility] Patient Information 2012 [place], [place].  Normal Labor and Delivery Your caregiver must first be sure you are in labor. Signs of labor include:  You may pass what is called "the mucus plug" before labor begins. This is a small amount of blood stained mucus.   Regular uterine contractions.   The time between contractions get closer together.   The discomfort and pain gradually gets more intense.   Pains are mostly located in the back.   Pains get worse when walking.   The cervix (the opening of the  uterus becomes thinner (begins to efface) and opens up (dilates).  Once you are in labor and admitted into the hospital or care center, your caregiver will do the following:  A complete physical examination.   Check your vital signs (blood pressure, pulse, temperature and the fetal heart rate).   Do a vaginal examination (using a sterile glove and lubricant) to determine:   The position (presentation) of the baby (head [vertex] or buttock first).   The level (station) of the baby's head in the birth canal.   The effacement and dilatation of the cervix.   You may have your pubic hair shaved and be given an enema depending on your caregiver and the circumstance.   An electronic monitor is usually placed on your abdomen. The monitor follows the length and intensity of the contractions, as well as the baby's heart rate.   Usually,  your caregiver will insert an IV in your arm with a bottle of sugar water. This is done as a precaution so that medications can be given to you quickly during labor or delivery.  NORMAL LABOR AND DELIVERY IS DIVIDED UP INTO 3 STAGES: First Stage This is when regular contractions begin and the cervix begins to efface and dilate. This stage can last from 3 to 15 hours. The end of the first stage is when the cervix is 100% effaced and 10 centimeters dilated. Pain medications may be given by   Injection (morphine, demerol, etc.)   Regional anesthesia (spinal, caudal or epidural, anesthetics given in different locations of the spine). Paracervical pain medication may be given, which is an injection of and anesthetic on each side of the cervix.  A pregnant woman may request to have "Natural Childbirth" which is not to have any medications or anesthesia during her labor and delivery. Second Stage This is when the baby comes down through the birth canal (vagina) and is born. This can take 1 to 4 hours. As the baby's head comes down through the birth canal, you may feel like you are going to have a bowel movement. You will get the urge to bear down and push until the baby is delivered. As the baby's head is being delivered, the caregiver will decide if an episiotomy (a cut in the perineum and vagina area) is needed to prevent tearing of the tissue in this area. The episiotomy is sewn up after the delivery of the baby and placenta. Sometimes a mask with nitrous oxide is given for the mother to breath during the delivery of the baby to help if there is too much pain. The end of Stage 2 is when the baby is fully delivered. Then when the umbilical cord stops pulsating it is clamped and cut. Third Stage The third stage begins after the baby is completely delivered and ends after the placenta (afterbirth) is delivered. This usually takes 5 to 30 minutes. After the placenta is delivered, a medication is given either by  intravenous or injection to help contract the uterus and prevent bleeding. The third stage is not painful and pain medication is usually not necessary. If an episiotomy was done, it is repaired at this time. After the delivery, the mother is watched and monitored closely for 1 to 2 hours to make sure there is no postpartum bleeding (hemorrhage). If there is a lot of bleeding, medication is given to contract the uterus and stop the bleeding. Document Released: 09/28/2007 Document Revised: 12/08/2010 Document Reviewed: 09/28/2007 [facility] Patient Information 2012 [place], [place].

## 2011-06-06 NOTE — MAU Note (Signed)
PT SAYS SHE HAS HAD LOWER ABD PAIN IN ABD X3 - STARTED Friday-   HAS BEEN MORE OFTEN TODAY.

## 2011-07-11 ENCOUNTER — Encounter (HOSPITAL_COMMUNITY): Payer: Self-pay | Admitting: *Deleted

## 2011-07-11 ENCOUNTER — Inpatient Hospital Stay (HOSPITAL_COMMUNITY)
Admission: AD | Admit: 2011-07-11 | Discharge: 2011-07-11 | Disposition: A | Discharge status: Home / Self Care | Payer: Medicaid Other | Source: Ambulatory Visit | Attending: Obstetrics & Gynecology | Admitting: Obstetrics & Gynecology

## 2011-07-11 DIAGNOSIS — O99891 Other specified diseases and conditions complicating pregnancy: Secondary | ICD-10-CM | POA: Insufficient documentation

## 2011-07-11 DIAGNOSIS — N949 Unspecified condition associated with female genital organs and menstrual cycle: Secondary | ICD-10-CM | POA: Insufficient documentation

## 2011-07-11 DIAGNOSIS — O26899 Other specified pregnancy related conditions, unspecified trimester: Secondary | ICD-10-CM

## 2011-07-11 DIAGNOSIS — N898 Other specified noninflammatory disorders of vagina: Secondary | ICD-10-CM

## 2011-07-11 LAB — AMNISURE RUPTURE OF MEMBRANE (ROM) NOT AT ARMC: Amnisure ROM: NEGATIVE

## 2011-07-11 NOTE — MAU Provider Note (Signed)
Chief Complaint:  Rupture of Membranes  First Provider Initiated Contact with Patient 07/11/11 2002     HPI  Carmen Johnston is a 31 y.o. [id number] at [redacted]w[redacted]d presenting with one episode of leaking clear fluid down legs after getting up from toilet this afternoon. No leaking since. Denies contractions, or vaginal bleeding. Good fetal movement.    Past Medical History: Past Medical History  Diagnosis Date  . Kidney stone   . Pregnancy induced hypertension   . Anemia   . Urinary tract infection   . Abnormal Pap smear   . Chlamydia   . Trichomonas     Past Surgical History: Past Surgical History  Procedure Date  . Induced abortion   . Colposcopy     Family History: Family History  Problem Relation Age of Onset  . Anesthesia problems Neg Hx   . Mental illness Mother     Social History: History  Substance Use Topics  . Smoking status: Current Everyday Smoker -- 0.2 packs/day for 10 years    Types: Cigarettes  . Smokeless tobacco: Never Used  . Alcohol Use: No    Allergies: No Known Allergies  Meds:  Prescriptions prior to admission  Medication Sig Dispense Refill  . acetaminophen (TYLENOL) 325 MG tablet Take 2 tablets (650 mg total) by mouth every 6 (six) hours as needed for pain.      . ferrous sulfate 325 (65 FE) MG tablet Take 325 mg by mouth daily with breakfast.      . Prenatal Vit-Fe Fumarate-FA (PRENATAL MULTIVITAMIN) TABS Take 1 tablet by mouth at bedtime.        Physical Exam  Blood pressure 127/83, pulse 92, temperature 98.3 F (36.8 C), temperature source Oral, resp. rate 18, height 5\' 7"  (1.702 m), weight 103.329 kg (227 lb 12.8 oz), last menstrual period 10/12/2010, SpO2 99.00%. GENERAL: Well-developed, well-nourished female in no acute distress.  HEENT: normocephalic, good dentition HEART: normal rate RESP: normal effort ABDOMEN: Soft, nontender, gravid appropriate for gestational age EXTREMITIES: Nontender, no edema NEURO: alert and  oriented  SPECULUM EXAM: Neg pooling. No discharge.   Dilation: Fingertip (1 CM ON OUTSIDE. CLOSED ON INSIDE) Effacement (%):  (LONG- SOFT) Cervical Position: Posterior Station: -3 Exam by:: Mansi Tokar , CNM  FHT:  Baseline 130 , moderate variability, accelerations present, one variable deceleration Contractions: rare, mild   Labs: Fern: neg  Imaging:  NA  Assessment: 1. Vaginal discharge in pregnancy    Plan: D/C home Labor precautions, FKCs Medication List  As of 07/13/2011 12:48 AM   CONTINUE taking these medications         acetaminophen 325 MG tablet   Commonly known as: TYLENOL   Take 2 tablets (650 mg total) by mouth every 6 (six) hours as needed for pain.      ferrous sulfate 325 (65 FE) MG tablet      prenatal multivitamin Tabs           Follow-up Information    Follow up with [facility] [place] in 1 week. (or MAU as needed if symptoms worsen)    Contact information:   [address] [facility] [address]         [place], [place] 7/9/20138:32 PM

## 2011-07-11 NOTE — MAU Note (Signed)
Patient states she had a gush of clear fluid at 1600 in the bathroom after her visit with [facility]. Has been having some irregular contractions. Reports good fetal movement.

## 2011-07-13 NOTE — MAU Provider Note (Signed)
Agree with above note.  Kiala Faraj 07/13/2011 10:21 AM   

## 2011-07-20 ENCOUNTER — Inpatient Hospital Stay (HOSPITAL_COMMUNITY)
Admission: AD | Admit: 2011-07-20 | Discharge: 2011-07-21 | DRG: 775 | Disposition: A | Discharge status: Home / Self Care | Payer: Medicaid Other | Source: Ambulatory Visit | Attending: Obstetrics & Gynecology | Admitting: Obstetrics & Gynecology

## 2011-07-20 ENCOUNTER — Encounter (HOSPITAL_COMMUNITY): Payer: Self-pay | Admitting: Anesthesiology

## 2011-07-20 ENCOUNTER — Inpatient Hospital Stay (HOSPITAL_COMMUNITY): Payer: Medicaid Other | Admitting: Anesthesiology

## 2011-07-20 ENCOUNTER — Encounter (HOSPITAL_COMMUNITY): Payer: Self-pay | Admitting: *Deleted

## 2011-07-20 DIAGNOSIS — O139 Gestational [pregnancy-induced] hypertension without significant proteinuria, unspecified trimester: Secondary | ICD-10-CM

## 2011-07-20 DIAGNOSIS — O9902 Anemia complicating childbirth: Secondary | ICD-10-CM

## 2011-07-20 DIAGNOSIS — D649 Anemia, unspecified: Secondary | ICD-10-CM

## 2011-07-20 DIAGNOSIS — O99334 Smoking (tobacco) complicating childbirth: Secondary | ICD-10-CM

## 2011-07-20 LAB — COMPREHENSIVE METABOLIC PANEL
ALT: 16 U/L (ref 0–35)
Alkaline Phosphatase: 86 U/L (ref 39–117)
CO2: 20 mEq/L (ref 19–32)
GFR calc Af Amer: >90 mL/min (ref >90)
GFR calc non Af Amer: >90 mL/min (ref >90)
Glucose, Bld: 86 mg/dL (ref 70–99)
Potassium: 4.4 mEq/L (ref 3.5–5.1)
Sodium: 135 mEq/L (ref 135–145)

## 2011-07-20 LAB — URINALYSIS, ROUTINE W REFLEX MICROSCOPIC
Ketones, ur: NEGATIVE mg/dL
Leukocytes, UA: NEGATIVE
Nitrite: NEGATIVE
Protein, ur: NEGATIVE mg/dL
pH: 6 (ref 5.0–8.0)

## 2011-07-20 LAB — PROTEIN / CREATININE RATIO, URINE: Protein Creatinine Ratio: 0.06 (ref 0.00–0.15)

## 2011-07-20 LAB — CBC
HCT: 35.1 % — ABNORMAL LOW (ref 36.0–46.0)
MCH: 27.6 pg (ref 26.0–34.0)
MCHC: 32.2 g/dL (ref 30.0–36.0)
MCHC: 32 g/dL (ref 30.0–36.0)
MCV: 86.2 fL (ref 78.0–100.0)
Platelets: 193 10*3/uL (ref 150–400)
RDW: 13.9 % (ref 11.5–15.5)

## 2011-07-20 MED ORDER — BENZOCAINE-MENTHOL 20-0.5 % EX AERO: 1.0000 "application " | INHALATION_SPRAY | CUTANEOUS | Status: DC | PRN

## 2011-07-20 MED ORDER — DIBUCAINE 1 % RE OINT: 1.0000 "application " | TOPICAL_OINTMENT | RECTAL | Status: DC | PRN

## 2011-07-20 MED ORDER — EPHEDRINE 5 MG/ML INJ: 10.0000 mg | INTRAVENOUS | Status: DC | PRN

## 2011-07-20 MED ORDER — LACTATED RINGERS IV SOLN
INTRAVENOUS | Status: DC
  Administered 2011-07-20 (×3): via INTRAVENOUS

## 2011-07-20 MED ORDER — LACTATED RINGERS IV SOLN
500.0000 mL | Freq: Once | INTRAVENOUS | Status: AC
  Administered 2011-07-20: 500 mL via INTRAVENOUS

## 2011-07-20 MED ORDER — BUTORPHANOL TARTRATE 1 MG/ML IJ SOLN
1.0000 mg | INTRAMUSCULAR | Status: DC | PRN
  Administered 2011-07-20 (×2): 1 mg via INTRAVENOUS
  Filled 2011-07-20 (×2): qty 1

## 2011-07-20 MED ORDER — ONDANSETRON HCL 4 MG/2ML IJ SOLN: 4.0000 mg | INTRAMUSCULAR | Status: DC | PRN

## 2011-07-20 MED ORDER — OXYCODONE-ACETAMINOPHEN 5-325 MG PO TABS
1.0000 | ORAL_TABLET | ORAL | Status: DC | PRN
  Administered 2011-07-20: 1 via ORAL
  Filled 2011-07-20: qty 1

## 2011-07-20 MED ORDER — PRENATAL MULTIVITAMIN CH
1.0000 | ORAL_TABLET | Freq: Every day | ORAL | Status: DC
  Administered 2011-07-21: 1 via ORAL
  Filled 2011-07-20: qty 1

## 2011-07-20 MED ORDER — TERBUTALINE SULFATE 1 MG/ML IJ SOLN: 0.2500 mg | Freq: Once | INTRAMUSCULAR | Status: DC | PRN

## 2011-07-20 MED ORDER — SIMETHICONE 80 MG PO CHEW: 80.0000 mg | CHEWABLE_TABLET | ORAL | Status: DC | PRN

## 2011-07-20 MED ORDER — IBUPROFEN 600 MG PO TABS
600.0000 mg | ORAL_TABLET | Freq: Four times a day (QID) | ORAL | Status: DC | PRN
  Administered 2011-07-20: 600 mg via ORAL
  Filled 2011-07-20: qty 1

## 2011-07-20 MED ORDER — DIPHENHYDRAMINE HCL 25 MG PO CAPS: 25.0000 mg | ORAL_CAPSULE | Freq: Four times a day (QID) | ORAL | Status: DC | PRN

## 2011-07-20 MED ORDER — OXYCODONE-ACETAMINOPHEN 5-325 MG PO TABS
1.0000 | ORAL_TABLET | ORAL | Status: DC | PRN
  Administered 2011-07-20 – 2011-07-21 (×3): 1 via ORAL
  Filled 2011-07-20 (×3): qty 1

## 2011-07-20 MED ORDER — LIDOCAINE HCL (PF) 1 % IJ SOLN
INTRAMUSCULAR | Status: DC | PRN
  Administered 2011-07-20 (×3): 4 mL

## 2011-07-20 MED ORDER — OXYTOCIN 40 UNITS IN LACTATED RINGERS INFUSION - SIMPLE MED
62.5000 mL/h | Freq: Once | INTRAVENOUS | Status: AC
  Administered 2011-07-20: 62.5 mL/h via INTRAVENOUS
  Filled 2011-07-20: qty 1000

## 2011-07-20 MED ORDER — DIPHENHYDRAMINE HCL 50 MG/ML IJ SOLN: 12.5000 mg | INTRAMUSCULAR | Status: DC | PRN

## 2011-07-20 MED ORDER — OXYTOCIN BOLUS FROM INFUSION
250.0000 mL | Freq: Once | INTRAVENOUS | Status: DC
  Filled 2011-07-20: qty 500

## 2011-07-20 MED ORDER — MISOPROSTOL 25 MCG QUARTER TABLET
25.0000 ug | ORAL_TABLET | ORAL | Status: AC | PRN
  Administered 2011-07-20 (×2): 25 ug via VAGINAL
  Filled 2011-07-20 (×2): qty 0.25

## 2011-07-20 MED ORDER — EPHEDRINE 5 MG/ML INJ
10.0000 mg | INTRAVENOUS | Status: DC | PRN
  Filled 2011-07-20: qty 4

## 2011-07-20 MED ORDER — PHENYLEPHRINE 40 MCG/ML (10ML) SYRINGE FOR IV PUSH (FOR BLOOD PRESSURE SUPPORT): 80.0000 ug | PREFILLED_SYRINGE | INTRAVENOUS | Status: DC | PRN

## 2011-07-20 MED ORDER — CITRIC ACID-SODIUM CITRATE 334-500 MG/5ML PO SOLN: 30.0000 mL | ORAL | Status: DC | PRN

## 2011-07-20 MED ORDER — IBUPROFEN 600 MG PO TABS
600.0000 mg | ORAL_TABLET | Freq: Four times a day (QID) | ORAL | Status: DC
  Administered 2011-07-21 (×4): 600 mg via ORAL
  Filled 2011-07-20 (×4): qty 1

## 2011-07-20 MED ORDER — ONDANSETRON HCL 4 MG PO TABS: 4.0000 mg | ORAL_TABLET | ORAL | Status: DC | PRN

## 2011-07-20 MED ORDER — ACETAMINOPHEN 325 MG PO TABS: 650.0000 mg | ORAL_TABLET | ORAL | Status: DC | PRN

## 2011-07-20 MED ORDER — LANOLIN HYDROUS EX OINT: TOPICAL_OINTMENT | CUTANEOUS | Status: DC | PRN

## 2011-07-20 MED ORDER — TETANUS-DIPHTH-ACELL PERTUSSIS 5-2.5-18.5 LF-MCG/0.5 IM SUSP
0.5000 mL | Freq: Once | INTRAMUSCULAR | Status: AC
Start: 2011-07-21 — End: 2011-07-21
  Administered 2011-07-21: 0.5 mL via INTRAMUSCULAR
  Filled 2011-07-20: qty 0.5

## 2011-07-20 MED ORDER — FLEET ENEMA 7-19 GM/118ML RE ENEM: 1.0000 | ENEMA | RECTAL | Status: DC | PRN

## 2011-07-20 MED ORDER — LACTATED RINGERS IV SOLN
500.0000 mL | INTRAVENOUS | Status: DC | PRN
  Administered 2011-07-20: 1000 mL via INTRAVENOUS

## 2011-07-20 MED ORDER — WITCH HAZEL-GLYCERIN EX PADS: 1.0000 "application " | MEDICATED_PAD | CUTANEOUS | Status: DC | PRN

## 2011-07-20 MED ORDER — FENTANYL 2.5 MCG/ML BUPIVACAINE 1/10 % EPIDURAL INFUSION (WH - ANES)
14.0000 mL/h | INTRAMUSCULAR | Status: DC
  Administered 2011-07-20: 14 mL/h via EPIDURAL
  Filled 2011-07-20: qty 60

## 2011-07-20 MED ORDER — FERROUS SULFATE 325 (65 FE) MG PO TABS
325.0000 mg | ORAL_TABLET | Freq: Every day | ORAL | Status: DC
  Administered 2011-07-20: 325 mg via ORAL
  Filled 2011-07-20 (×2): qty 1

## 2011-07-20 MED ORDER — LIDOCAINE HCL (PF) 1 % IJ SOLN
30.0000 mL | INTRAMUSCULAR | Status: DC | PRN
  Filled 2011-07-20: qty 30

## 2011-07-20 MED ORDER — ONDANSETRON HCL 4 MG/2ML IJ SOLN: 4.0000 mg | Freq: Four times a day (QID) | INTRAMUSCULAR | Status: DC | PRN

## 2011-07-20 MED ORDER — SENNOSIDES-DOCUSATE SODIUM 8.6-50 MG PO TABS
2.0000 | ORAL_TABLET | Freq: Every day | ORAL | Status: DC
  Administered 2011-07-20: 2 via ORAL

## 2011-07-20 MED ORDER — ZOLPIDEM TARTRATE 5 MG PO TABS: 5.0000 mg | ORAL_TABLET | Freq: Every evening | ORAL | Status: DC | PRN

## 2011-07-20 MED ORDER — PHENYLEPHRINE 40 MCG/ML (10ML) SYRINGE FOR IV PUSH (FOR BLOOD PRESSURE SUPPORT)
80.0000 ug | PREFILLED_SYRINGE | INTRAVENOUS | Status: DC | PRN
  Filled 2011-07-20: qty 5

## 2011-07-20 NOTE — Anesthesia Preprocedure Evaluation (Signed)
Anesthesia Evaluation  Patient identified by MRN, date of birth, ID band Patient awake    Reviewed: Allergy & Precautions, H&P , NPO status , Patient's Chart, lab work & pertinent test results, reviewed documented beta blocker date and time   History of Anesthesia Complications Negative for: history of anesthetic complications  Airway Mallampati: III TM Distance: >3 FB Neck ROM: full    Dental  (+) Teeth Intact   Pulmonary Current Smoker,  breath sounds clear to auscultation        Cardiovascular hypertension (PIH ), Rhythm:regular Rate:Normal     Neuro/Psych negative neurological ROS  negative psych ROS   GI/Hepatic negative GI ROS, Neg liver ROS,   Endo/Other  Morbid obesity  Renal/GU negative Renal ROS  negative genitourinary   Musculoskeletal   Abdominal   Peds  Hematology negative hematology ROS (+)   Anesthesia Other Findings   Reproductive/Obstetrics (+) Pregnancy                           Anesthesia Physical Anesthesia Plan  ASA: III  Anesthesia Plan: Epidural   Post-op Pain Management:    Induction:   Airway Management Planned:   Additional Equipment:   Intra-op Plan:   Post-operative Plan:   Informed Consent: I have reviewed the patients History and Physical, chart, labs and discussed the procedure including the risks, benefits and alternatives for the proposed anesthesia with the patient or authorized representative who has indicated his/her understanding and acceptance.     Plan Discussed with:   Anesthesia Plan Comments:         Anesthesia Quick Evaluation

## 2011-07-20 NOTE — Anesthesia Procedure Notes (Signed)
Epidural Patient location during procedure: OB Start time: 07/20/2011 2:11 PM Reason for block: procedure for pain  Staffing Performed by: anesthesiologist   Preanesthetic Checklist Completed: patient identified, site marked, surgical consent, pre-op evaluation, timeout performed, IV checked, risks and benefits discussed and monitors and equipment checked  Epidural Patient position: sitting Prep: site prepped and draped and DuraPrep Patient monitoring: continuous pulse ox and blood pressure Approach: midline Injection technique: LOR air  Needle:  Needle type: Tuohy  Needle gauge: 17 G Needle length: 9 cm Needle insertion depth: 6 cm Catheter type: closed end flexible Catheter size: 19 Gauge Catheter at skin depth: 11 cm Test dose: negative  Assessment Events: blood not aspirated, injection not painful, no injection resistance, negative IV test and no paresthesia  Additional Notes Discussed risk of headache, infection, bleeding, nerve injury and failed or incomplete block.  Patient voices understanding and wishes to proceed.

## 2011-07-20 NOTE — MAU Note (Signed)
Contractions q 20 minutes , denies bleeding or ROM

## 2011-07-20 NOTE — H&P (Signed)
Carmen Johnston is a [id number] at 40 weeks + 0 days gestation by LMP who presents for contractions. She started having mild contractions every 10-20 minutes about 3 hours ago. Her last labor was only 4 hours, so she decided to come in to get checked. She reports good fetal movement and denies vaginal bleeding and LOF.  She does complain of some increased edema over the last 24 hours (new for her). She otherwise feels well.   This pregnancy has been complicated by anemia and smoking only.  She had pre-eclampsia with her first pregnancy/delivery, but not since.  She was supposed to have a [facility] today, but missed it.  She was seen in the MAU on 7/9 and her cervix was closed.  Maternal Medical History:  Reason for admission: Reason for admission: contractions and nausea.  Contractions: Onset was 3-5 hours ago.   Frequency: irregular.   Duration is approximately 1 minute.   Perceived severity is moderate.    Fetal activity: Perceived fetal activity is normal.   Last perceived fetal movement was within the past hour.    Prenatal complications: No bleeding, hypertension, IUGR, oligohydramnios, pre-eclampsia or preterm labor.   Prenatal Complications - Diabetes: none.    OB History    Grav Para Term Preterm Abortions TAB SAB Ect Mult Living   7 3 3  0 3 1 1 1  3    3  vaginal deliveries  Past Medical History  Diagnosis Date  . Kidney stone   . Pregnancy induced hypertension   . Anemia   . Urinary tract infection   . Abnormal Pap smear   . Chlamydia   . Trichomonas    Past Surgical History  Procedure Date  . Induced abortion   . Colposcopy    Family History: family history includes Mental illness in her mother.  There is no history of Anesthesia problems. Social History:  reports that she has been smoking Cigarettes.  She has a 2.5 pack-year smoking history. She has never used smokeless tobacco. She reports that she does not drink alcohol or use illicit  drugs.   Prenatal Transfer Tool  Maternal Diabetes: No Genetic Screening: Declined Maternal Ultrasounds/Referrals: Normal Fetal Ultrasounds or other Referrals:  None Maternal Substance Abuse:  Yes:  Type: Smoker Significant Maternal Medications:  None Significant Maternal Lab Results:  Lab values include: Other: see prenatal record - Anemia  Review of Systems  Constitutional: Negative for fever and chills.  Eyes: Negative for blurred vision.  Respiratory: Negative for cough and shortness of breath.   Cardiovascular: Negative for chest pain.  Gastrointestinal: Positive for nausea. Negative for vomiting, abdominal pain and diarrhea.  Genitourinary: Negative for dysuria and hematuria.  Skin: Negative for itching and rash.  Neurological: Negative for dizziness, tingling and headaches.   Initial cervical exam: Dilation: 1 Effacement (%): Thick Station: High  Exam by:: Dr. Maisie Fus @ 0210  Initial cervical exam: Dilation: 1 Effacement (%): Thick Station: High Exam by:: Dr. Maisie Fus @ [phone]  Blood pressure 142/97, pulse 73, temperature 99 F (37.2 C), temperature source Oral, resp. rate 20, height 5\' 7"  (1.702 m), weight 104.781 kg (231 lb), last menstrual period 10/12/2010, SpO2 100.00%.  Temp:  [99 F (37.2 C)] 99 F (37.2 C) (07/18 0151) Pulse Rate:  [73-75] 73  (07/18 0407) Resp:  [20] 20  (07/18 0151) BP: (142-150)/(90-97) 142/97 mmHg (07/18 0407) SpO2:  [100 %] 100 % (07/18 0151) Weight:  [104.781 kg (231 lb)] 104.781 kg (231  lb) (07/18 0151)  Maternal Exam:  Uterine Assessment: Contraction strength is mild.  Contraction duration is 75 seconds. Contraction frequency is irregular.   Abdomen: Patient reports no abdominal tenderness. Fetal presentation: vertex Will verify vertex position with ultrasound given large body habitus  Introitus: Normal vulva. Normal vagina.  Ferning test: not done.  Nitrazine test: not done.  Cervix: Cervix evaluated by digital exam.      Fetal Exam Fetal Monitor Review: Mode: ultrasound.   Baseline rate: 135.  Variability: moderate (6-25 bpm).   Pattern: accelerations present, variable decelerations and early decelerations.   One prolonged late deceleration with moderate variability throughout and a return to baseline. Scattered variable decelerations.  Fetal State Assessment: Category II - tracings are indeterminate.     Physical Exam  Constitutional: She is oriented to person, place, and time. She appears well-developed and well-nourished. No distress.  HENT:  Head: Normocephalic and atraumatic.  Eyes: Conjunctivae are normal.  Neck: Normal range of motion. Neck supple.  Cardiovascular: Normal rate, regular rhythm and normal heart sounds.   Respiratory: Effort normal and breath sounds normal.  GI: Soft. Bowel sounds are normal.  Neurological: She is alert and oriented to person, place, and time. She displays normal reflexes.  Skin: Skin is warm and dry. She is not diaphoretic.  Psychiatric: She has a normal mood and affect. Her behavior is normal.    Prenatal labs: ABO, Rh:  B POS Antibody:  NEG Rubella:  NON-IMMUNE RPR:   NEG HBsAg:   NEG HIV:   NEG 03/15/11 GBS:   Negative 7/10  Assessment/Plan: 1. Z6X0960 at 40.[redacted] weeks gestation with elevated blood pressures and contractions but no active labor or sign/symptom of pre-eclampsia:  All BPs obtained in the MAU have been > or = 140/90.  She had one episode of a prolonged late deceleration but the tracing after further monitoring is now Category I.  She is contracting every few minutes but has not made any cervical change.  She is a smoker with a history of SGA babies (2/3).  Given this constellation of symptoms and the fact that she is at term, we have elected to admit her for gestational hypertension and induce her labor with Cytotec first and then Pitocin as needed.  We have discussed the risks and benefits of this, and she agrees to the plan.  Case  discussed with Philipp Deputy, CNM   Junious Silk S 07/20/2011, 4:18 AM

## 2011-07-20 NOTE — Anesthesia Postprocedure Evaluation (Signed)
  Anesthesia Post-op Note  Patient: Carmen Johnston  Procedure(s) Performed: * No procedures listed *  Patient Location: Mother/Baby  Anesthesia Type: Epidural  Level of Consciousness: awake  Airway and Oxygen Therapy: Patient Spontanous Breathing  Post-op Pain: mild  Post-op Assessment: Patient's Cardiovascular Status Stable and Respiratory Function Stable  Post-op Vital Signs: stable  Complications: No apparent anesthesia complications

## 2011-07-20 NOTE — Progress Notes (Signed)
Pt /EFM tracing .deceleration down to 100 over noted.

## 2011-07-20 NOTE — MAU Note (Addendum)
Pt states she started having pain at about 2300. Pt states contractions are about Q10-20

## 2011-07-20 NOTE — Progress Notes (Signed)
  Subjective: Patient with increasing discomfort with contractions. Asking for more pain meds and epidural.  Objective: BP 129/79  Pulse 65  Temp 98 F (36.7 C) (Oral)  Resp 18  Ht 5\' 7"  (1.702 m)  Wt 104.781 kg (231 lb)  BMI 36.18 kg/m2  SpO2 100%  LMP 10/12/2010      FHT:  FHR: 125 bpm, variability: moderate,  accelerations:  Abscent,  decelerations:  Absent UC:   Not tracing well SVE:   3/80/-3  Labs: Lab Results  Component Value Date   WBC 9.4 07/20/2011   HGB 11.3* 07/20/2011   HCT 35.1* 07/20/2011   MCV 86.2 07/20/2011   PLT 206 07/20/2011    Assessment / Plan: Labor progressing well.  Epidural at patients request. Stadol for pain management.  Marikay Alar 07/20/2011, 1:25 PM

## 2011-07-20 NOTE — Progress Notes (Signed)
Informed of pt's deceleration.

## 2011-07-20 NOTE — Progress Notes (Signed)
   Subjective: Pt reports some relief from the cramping with stadol.  Objective: BP 129/79  Pulse 65  Temp 98 F (36.7 C) (Oral)  Resp 18  Ht 5\' 7"  (1.702 m)  Wt 104.781 kg (231 lb)  BMI 36.18 kg/m2  SpO2 100%  LMP 10/12/2010      FHT:  FHR: 130's bpm, variability: moderate,  accelerations:  Present,  decelerations:  Absent; 10x10 acels UC:   irregular, every 3-6 minutes SVE:   Dilation: 1 Effacement (%): Thick Station: -3 Exam by:: LDawayne Cirri  Labs: Lab Results  Component Value Date   WBC 9.4 07/20/2011   HGB 11.3* 07/20/2011   HCT 35.1* 07/20/2011   MCV 86.2 07/20/2011   PLT 206 07/20/2011    Assessment / Plan: Induction of Labor for Gest Htn  Labor: Progressing normally Preeclampsia:  labs stable Fetal Wellbeing:  Category I Pain Control:  Stadol I/D:  n/a Anticipated MOD:  NSVD  [facility] 07/20/2011, 11:18 AM

## 2011-07-20 NOTE — Progress Notes (Signed)
Delivery of live viable female by Dr Birdie Sons OB-Resident assisted by Ilean Skill, CNM. APGARs 9,9

## 2011-07-21 MED ORDER — BENZOCAINE 20 % MT GEL
Freq: Three times a day (TID) | OROMUCOSAL | Status: DC | PRN
  Administered 2011-07-21: 11:00:00 via OROMUCOSAL
  Filled 2011-07-21: qty 30

## 2011-07-21 MED ORDER — BENZOCAINE 10 % MT GEL: Freq: Three times a day (TID) | OROMUCOSAL | Status: DC | PRN

## 2011-07-21 MED ORDER — IBUPROFEN 600 MG PO TABS: 600.0000 mg | ORAL_TABLET | Freq: Four times a day (QID) | ORAL | Status: AC

## 2011-07-21 MED ORDER — MEASLES, MUMPS & RUBELLA VAC ~~LOC~~ INJ
0.5000 mL | INJECTION | Freq: Once | SUBCUTANEOUS | Status: AC
  Administered 2011-07-21: 0.5 mL via SUBCUTANEOUS
  Filled 2011-07-21 (×2): qty 0.5

## 2011-07-21 MED ORDER — SENNOSIDES-DOCUSATE SODIUM 8.6-50 MG PO TABS: 2.0000 | ORAL_TABLET | Freq: Every day | ORAL | Status: DC

## 2011-07-21 NOTE — Progress Notes (Signed)
UR Chart review completed.  

## 2011-07-21 NOTE — Discharge Summary (Signed)
Obstetric Discharge Summary Reason for Admission: onset of labor Prenatal Procedures: ultrasound Intrapartum Procedures: spontaneous vaginal delivery Postpartum Procedures: none Complications-Operative and Postpartum: none Hemoglobin  Date Value Range Status  07/20/2011 11.6* 12.0 - 15.0 g/dL Final     HCT  Date Value Range Status  07/20/2011 36.3  36.0 - 46.0 % Final    Physical Exam:  General: alert and cooperative Lochia: appropriate Uterine Fundus: firm Incision: n/a DVT Evaluation: No evidence of DVT seen on physical exam. Negative Homan's sign. No cords or calf tenderness. No significant calf/ankle edema.  Discharge Diagnoses: Term Pregnancy-delivered  Discharge Information: Date: 07/21/2011 Activity: unrestricted Diet: routine Medications: PNV and Ibuprofen Condition: stable Instructions: refer to practice specific booklet Discharge to: home   Newborn Data: Live born female  Birth Weight: 6 lb 5.8 oz (2886 g) APGAR: 9, 9  Home with mother.  Marikay Alar 07/21/2011, 7:40 AM  Patient seen and examined.  Agree with above note.  Levie Heritage, DO 07/21/2011 9:28 AM

## 2011-09-25 ENCOUNTER — Encounter (HOSPITAL_COMMUNITY): Payer: Self-pay | Admitting: *Deleted

## 2011-09-25 ENCOUNTER — Inpatient Hospital Stay (HOSPITAL_COMMUNITY)
Admission: AD | Admit: 2011-09-25 | Discharge: 2011-09-25 | Disposition: A | Discharge status: Home / Self Care | Payer: Medicaid Other | Source: Ambulatory Visit | Attending: Obstetrics & Gynecology | Admitting: Obstetrics & Gynecology

## 2011-09-25 DIAGNOSIS — R109 Unspecified abdominal pain: Secondary | ICD-10-CM | POA: Insufficient documentation

## 2011-09-25 DIAGNOSIS — O2390 Unspecified genitourinary tract infection in pregnancy, unspecified trimester: Secondary | ICD-10-CM | POA: Insufficient documentation

## 2011-09-25 DIAGNOSIS — N390 Urinary tract infection, site not specified: Secondary | ICD-10-CM | POA: Insufficient documentation

## 2011-09-25 LAB — POCT PREGNANCY, URINE: Preg Test, Ur: NEGATIVE

## 2011-09-25 LAB — URINALYSIS, ROUTINE W REFLEX MICROSCOPIC
Bilirubin Urine: NEGATIVE
Glucose, UA: NEGATIVE mg/dL
Hgb urine dipstick: NEGATIVE
Protein, ur: NEGATIVE mg/dL
Urobilinogen, UA: 2 mg/dL — ABNORMAL HIGH (ref 0.0–1.0)

## 2011-09-25 LAB — URINE MICROSCOPIC-ADD ON

## 2011-09-25 MED ORDER — CEPHALEXIN 500 MG PO CAPS: 500.0000 mg | ORAL_CAPSULE | Freq: Four times a day (QID) | ORAL | Status: AC

## 2011-09-25 NOTE — MAU Provider Note (Signed)
Chief Complaint: Abdominal Pain   First Provider Initiated Contact with Patient 09/25/11 1231     SUBJECTIVE HPI: Carmen Johnston is a 31 y.o. [id number] who presents to MAU reporting 2-3 d hx of  left suprapubic pain with feeling of incomplete emptying when voiding and urgency, frequency of urination. Feels similar to when she has had UTI in past. Denies dysuria, back pain, hematuria, N/V or fever. No irritative vaginal discharge.  Intends to go to [facility] for her 8wk PP check. Breastfeeding.   Past Medical History  Diagnosis Date  . Kidney stone   . Pregnancy induced hypertension   . Anemia   . Urinary tract infection   . Abnormal Pap smear   . Chlamydia   . Trichomonas    OB History    Grav Para Term Preterm Abortions TAB SAB Ect Mult Living   7 4 4  0 3 1 1 1  4      # Outc Date GA Lbr Len/2nd Wgt Sex Del Anes PTL Lv   1 TRM 2/00    M SVD   Yes   Comments: pre-eclampsia   2 TRM 7/06    M SVD  No Yes   3 TRM 10/07    F SVD  No No   4 ECT 2010           Comments: MTX   5 TRM 7/13 [redacted]w[redacted]d 15:36 / 00:12 6lb5.8oz(2.886kg) M SVD EPI  Yes   6 TAB            7 SAB              Past Surgical History  Procedure Date  . Induced abortion   . Colposcopy    History   Social History  . Marital Status: Single    Spouse Name: N/A    Number of Children: N/A  . Years of Education: N/A   Occupational History  . Not on file.   Social History Main Topics  . Smoking status: Current Every Day Smoker -- 0.2 packs/day for 10 years    Types: Cigarettes  . Smokeless tobacco: Never Used  . Alcohol Use: No  . Drug Use: No  . Sexually Active: Yes   Other Topics Concern  . Not on file   Social History Narrative  . No narrative on file   No current facility-administered medications on file prior to encounter.   No current outpatient prescriptions on file prior to encounter.   No Known Allergies  ROS: Pertinent items in HPI  OBJECTIVE Blood pressure 131/84, pulse 77, temperature 98.2 F  (36.8 C), temperature source Oral, resp. rate 18, height 5\' 7"  (1.702 m), weight 83.008 kg (183 lb), currently breastfeeding. GENERAL: Well-developed, well-nourished female in no acute distress.  HEENT: Normocephalic HEART: normal rate RESP: normal effort ABDOMEN: Soft, non-tender throughtout BACK: Neg CVAT bilaterally EXTREMITIES: Nontender, no edema NEURO: Alert and oriented  LAB RESULTS Results for orders placed during the hospital encounter of 09/25/11 (from the past 24 hour(s))  POCT PREGNANCY, URINE     Status: Normal   Collection Time   09/25/11 11:31 AM      Component Value Range   Preg Test, Ur NEGATIVE  NEGATIVE  URINALYSIS, ROUTINE W REFLEX MICROSCOPIC     Status: Abnormal   Collection Time   09/25/11 11:34 AM      Component Value Range   Color, Urine YELLOW  YELLOW   APPearance CLOUDY (*) CLEAR   Specific Gravity, Urine 1.020  1.005 - 1.030   pH 8.0  5.0 - 8.0   Glucose, UA NEGATIVE  NEGATIVE mg/dL   Hgb urine dipstick NEGATIVE  NEGATIVE   Bilirubin Urine NEGATIVE  NEGATIVE   Ketones, ur NEGATIVE  NEGATIVE mg/dL   Protein, ur NEGATIVE  NEGATIVE mg/dL   Urobilinogen, UA 2.0 (*) 0.0 - 1.0 mg/dL   Nitrite NEGATIVE  NEGATIVE   Leukocytes, UA MODERATE (*) NEGATIVE  URINE MICROSCOPIC-ADD ON     Status: Normal   Collection Time   09/25/11 11:34 AM      Component Value Range   Squamous Epithelial / LPF RARE  RARE   WBC, UA 7-10  <3 WBC/hpf   RBC / HPF 0-2  <3 RBC/hpf   Bacteria, UA RARE  RARE    ASSESSMENT 1. UTI (lower urinary tract infection)   8w ks postpartum  PLAN Discharge home with AVS on UTI   Medication List     As of 09/25/2011 12:42 PM    TAKE these medications         cephALEXin 500 MG capsule   Commonly known as: KEFLEX   Take 1 capsule (500 mg total) by mouth 4 (four) times daily.       Follow up [facility] next week.    Danae Orleans, CNM 09/25/2011  12:32 PM

## 2011-09-25 NOTE — MAU Provider Note (Signed)
Medical Screening exam and patient care preformed by advanced practice provider.  Agree with the above management.  

## 2011-09-25 NOTE — MAU Note (Signed)
Pain on left side, when uses restroom- feels like is not going all the way. No pain with urination, pressure like not empty after going.  Started a couple days ago.

## 2012-03-06 ENCOUNTER — Emergency Department (HOSPITAL_COMMUNITY)
Admission: EM | Admit: 2012-03-06 | Discharge: 2012-03-06 | Disposition: A | Discharge status: Home / Self Care | Payer: Self-pay | Source: Home / Self Care

## 2012-05-23 ENCOUNTER — Encounter (HOSPITAL_COMMUNITY): Payer: Self-pay

## 2012-05-23 ENCOUNTER — Inpatient Hospital Stay (HOSPITAL_COMMUNITY)
Admission: AD | Admit: 2012-05-23 | Discharge: 2012-05-23 | Disposition: A | Discharge status: Home / Self Care | Payer: Self-pay | Source: Ambulatory Visit | Attending: Obstetrics & Gynecology | Admitting: Obstetrics & Gynecology

## 2012-05-23 DIAGNOSIS — N390 Urinary tract infection, site not specified: Secondary | ICD-10-CM | POA: Insufficient documentation

## 2012-05-23 DIAGNOSIS — R350 Frequency of micturition: Secondary | ICD-10-CM | POA: Insufficient documentation

## 2012-05-23 LAB — URINALYSIS, ROUTINE W REFLEX MICROSCOPIC
Bilirubin Urine: NEGATIVE
Ketones, ur: NEGATIVE mg/dL
Nitrite: NEGATIVE
Specific Gravity, Urine: 1.025 (ref 1.005–1.030)
Urobilinogen, UA: 0.2 mg/dL (ref 0.0–1.0)

## 2012-05-23 LAB — URINE MICROSCOPIC-ADD ON

## 2012-05-23 MED ORDER — CIPROFLOXACIN HCL 500 MG PO TABS: 500.0000 mg | ORAL_TABLET | Freq: Two times a day (BID) | ORAL | Status: DC

## 2012-05-23 MED ORDER — CIPROFLOXACIN HCL 500 MG PO TABS
500.0000 mg | ORAL_TABLET | Freq: Once | ORAL | Status: AC
  Administered 2012-05-23: 500 mg via ORAL
  Filled 2012-05-23: qty 1

## 2012-05-23 NOTE — MAU Note (Signed)
Pt states she has had a UTI before and states she has the same symptoms

## 2012-05-23 NOTE — MAU Note (Signed)
Patient states that she have symptoms of uti, burning and irritation and pressure when voiding. She does not feel like it could be any vaginal infections. She states that she will decline having pelvic exam done. She states that she have a 34 months old infant.

## 2012-05-23 NOTE — MAU Provider Note (Signed)
History     CSN: [id number]  Arrival date and time: 05/23/12 1948   None     Chief Complaint  Patient presents with  . Urinary Frequency   HPI  Carmen Johnston is a 32 y.o. [id number] who presents today with dysuria. She denies any vaginal discharge or pelvic complaints. She refuses a pelvic exam today.  Past Medical History  Diagnosis Date  . Kidney stone   . Pregnancy induced hypertension   . Anemia   . Urinary tract infection   . Abnormal Pap smear   . Chlamydia   . Trichomonas     Past Surgical History  Procedure Laterality Date  . Induced abortion    . Colposcopy      Family History  Problem Relation Age of Onset  . Anesthesia problems Neg Hx   . Mental illness Mother     History  Substance Use Topics  . Smoking status: Current Every Day Smoker -- 0.25 packs/day for 10 years    Types: Cigarettes  . Smokeless tobacco: Never Used  . Alcohol Use: No    Allergies: No Known Allergies  Prescriptions prior to admission  Medication Sig Dispense Refill  . aspirin 325 MG tablet Take 325 mg by mouth daily.      . Pseudoeph-Doxylamine-DM-APAP (NYQUIL PO) Take by mouth.        Review of Systems  Constitutional: Negative for fever.  Respiratory: Negative for shortness of breath.   Cardiovascular: Negative for chest pain.  Gastrointestinal: Negative for nausea, vomiting and abdominal pain.  Genitourinary: Positive for dysuria, urgency and frequency.  Musculoskeletal: Negative for myalgias.  Neurological: Negative for dizziness and headaches.   Physical Exam   Blood pressure 127/87, pulse 77, temperature 98.3 F (36.8 C), temperature source Oral, resp. rate 16, height 5\' 8"  (1.727 m), weight 82.101 kg (181 lb), last menstrual period 04/23/2012, SpO2 100.00%.  Physical Exam  Nursing note and vitals reviewed. Constitutional: She is oriented to person, place, and time. She appears well-developed and well-nourished. No distress.  Cardiovascular: Normal rate.    Respiratory: Effort normal.  GI: Soft. There is no tenderness.  Genitourinary:   No CMT  Neurological: She is alert and oriented to person, place, and time.  Skin: Skin is warm and dry.  Psychiatric: She has a normal mood and affect.    MAU Course  Procedures  Results for orders placed during the hospital encounter of 05/23/12 (from the past 24 hour(s))  URINALYSIS, ROUTINE W REFLEX MICROSCOPIC     Status: Abnormal   Collection Time    05/23/12  8:10 PM      Result Value Range   Color, Urine YELLOW  YELLOW   APPearance CLEAR  CLEAR   Specific Gravity, Urine 1.025  1.005 - 1.030   pH 6.0  5.0 - 8.0   Glucose, UA NEGATIVE  NEGATIVE mg/dL   Hgb urine dipstick TRACE (*) NEGATIVE   Bilirubin Urine NEGATIVE  NEGATIVE   Ketones, ur NEGATIVE  NEGATIVE mg/dL   Protein, ur NEGATIVE  NEGATIVE mg/dL   Urobilinogen, UA 0.2  0.0 - 1.0 mg/dL   Nitrite NEGATIVE  NEGATIVE   Leukocytes, UA TRACE (*) NEGATIVE  URINE MICROSCOPIC-ADD ON     Status: None   Collection Time    05/23/12  8:10 PM      Result Value Range   Squamous Epithelial / LPF RARE  RARE   WBC, UA 3-6  <3 WBC/hpf   RBC / HPF 3-6  <  3 RBC/hpf   Bacteria, UA RARE  RARE   Urine-Other AMORPHOUS URATES/PHOSPHATES    POCT PREGNANCY, URINE     Status: None   Collection Time    05/23/12  8:25 PM      Result Value Range   Preg Test, Ur NEGATIVE  NEGATIVE    Assessment and Plan  Presumed UTI Will treat with Cipro BID X 5 days FU as needed  Tawnya Crook 05/23/2012, 9:11 PM

## 2012-05-24 NOTE — MAU Provider Note (Signed)
Attestation of Attending Supervision of Advanced Practitioner (PA/CNM/NP): Evaluation and management procedures were performed by the Advanced Practitioner under my supervision and collaboration.  I have reviewed the Advanced Practitioner's note and chart, and I agree with the management and plan.  Milon Dethloff, MD, FACOG Attending Obstetrician & Gynecologist Faculty Practice, [facility]  

## 2013-09-11 ENCOUNTER — Encounter (HOSPITAL_COMMUNITY): Payer: Self-pay | Admitting: Emergency Medicine

## 2013-09-11 ENCOUNTER — Emergency Department (HOSPITAL_COMMUNITY)
Admission: EM | Admit: 2013-09-11 | Discharge: 2013-09-11 | Disposition: A | Discharge status: Home / Self Care | Payer: Self-pay | Attending: Emergency Medicine | Admitting: Emergency Medicine

## 2013-09-11 DIAGNOSIS — R21 Rash and other nonspecific skin eruption: Secondary | ICD-10-CM | POA: Insufficient documentation

## 2013-09-11 DIAGNOSIS — Z862 Personal history of diseases of the blood and blood-forming organs and certain disorders involving the immune mechanism: Secondary | ICD-10-CM | POA: Insufficient documentation

## 2013-09-11 DIAGNOSIS — Z79899 Other long term (current) drug therapy: Secondary | ICD-10-CM | POA: Insufficient documentation

## 2013-09-11 DIAGNOSIS — N390 Urinary tract infection, site not specified: Secondary | ICD-10-CM | POA: Insufficient documentation

## 2013-09-11 DIAGNOSIS — Z8679 Personal history of other diseases of the circulatory system: Secondary | ICD-10-CM | POA: Insufficient documentation

## 2013-09-11 DIAGNOSIS — B86 Scabies: Secondary | ICD-10-CM | POA: Insufficient documentation

## 2013-09-11 DIAGNOSIS — Z87448 Personal history of other diseases of urinary system: Secondary | ICD-10-CM | POA: Insufficient documentation

## 2013-09-11 DIAGNOSIS — Z8619 Personal history of other infectious and parasitic diseases: Secondary | ICD-10-CM | POA: Insufficient documentation

## 2013-09-11 DIAGNOSIS — Z792 Long term (current) use of antibiotics: Secondary | ICD-10-CM | POA: Insufficient documentation

## 2013-09-11 DIAGNOSIS — Z7982 Long term (current) use of aspirin: Secondary | ICD-10-CM | POA: Insufficient documentation

## 2013-09-11 DIAGNOSIS — F17200 Nicotine dependence, unspecified, uncomplicated: Secondary | ICD-10-CM | POA: Insufficient documentation

## 2013-09-11 MED ORDER — PERMETHRIN 5 % EX CREA: TOPICAL_CREAM | CUTANEOUS | Status: DC

## 2013-09-11 MED ORDER — DIPHENHYDRAMINE HCL 25 MG PO TABS: 25.0000 mg | ORAL_TABLET | Freq: Four times a day (QID) | ORAL | Status: DC

## 2013-09-11 NOTE — ED Notes (Signed)
Pt comes in with c/o rash x 1 month.  Pt says that rash is on both arms, chest, ankles, and the back of legs.  Pt says that rash is itchy.  No medications PTA.

## 2013-09-11 NOTE — Discharge Instructions (Signed)
Apply cream over your entire body sparing your face, leave on for 8-12 hours and rinse off.  Rash A rash is a change in the color or texture of your skin. There are many different types of rashes. You may have other problems that accompany your rash. CAUSES   Infections.  Allergic reactions. This can include allergies to pets or foods.  Certain medicines.  Exposure to certain chemicals, soaps, or cosmetics.  Heat.  Exposure to poisonous plants.  Tumors, both cancerous and noncancerous. SYMPTOMS   Redness.  Scaly skin.  Itchy skin.  Dry or cracked skin.  Bumps.  Blisters.  Pain. DIAGNOSIS  Your caregiver may do a physical exam to determine what type of rash you have. A skin sample (biopsy) may be taken and examined under a microscope. TREATMENT  Treatment depends on the type of rash you have. Your caregiver may prescribe certain medicines. For serious conditions, you may need to see a skin doctor (dermatologist). HOME CARE INSTRUCTIONS   Avoid the substance that caused your rash.  Do not scratch your rash. This can cause infection.  You may take cool baths to help stop itching.  Only take over-the-counter or prescription medicines as directed by your caregiver.  Keep all follow-up appointments as directed by your caregiver. SEEK IMMEDIATE MEDICAL CARE IF:  You have increasing pain, swelling, or redness.  You have a fever.  You have new or severe symptoms.  You have body aches, diarrhea, or vomiting.  Your rash is not better after 3 days. MAKE SURE YOU:  Understand these instructions.  Will watch your condition.  Will get help right away if you are not doing well or get worse. Document Released: 12/09/2001 Document Revised: 03/13/2011 Document Reviewed: 10/03/2010 [facility] Patient Information 2015 [place], [place]. This information is not intended to replace advice given to you by your health care provider. Make sure you discuss any questions you have  with your health care provider.  Scabies Scabies are small bugs (mites) that burrow under the skin and cause red bumps and severe itching. These bugs can only be seen with a microscope. Scabies are highly contagious. They can spread easily from person to person by direct contact. They are also spread through sharing clothing or linens that have the scabies mites living in them. It is not unusual for an entire family to become infected through shared towels, clothing, or bedding.  HOME CARE INSTRUCTIONS   Your caregiver may prescribe a cream or lotion to kill the mites. If cream is prescribed, massage the cream into the entire body from the neck to the bottom of both feet. Also massage the cream into the scalp and face if your child is less than 65 year old. Avoid the eyes and mouth. Do not wash your hands after application.  Leave the cream on for 8 to 12 hours. Your child should bathe or shower after the 8 to 12 hour application period. Sometimes it is helpful to apply the cream to your child right before bedtime.  One treatment is usually effective and will eliminate approximately 95% of infestations. For severe cases, your caregiver may decide to repeat the treatment in 1 week. Everyone in your household should be treated with one application of the cream.  New rashes or burrows should not appear within 24 to 48 hours after successful treatment. However, the itching and rash may last for 2 to 4 weeks after successful treatment. Your caregiver may prescribe a medicine to help with the itching or  to help the rash go away more quickly.  Scabies can live on clothing or linens for up to 3 days. All of your child's recently used clothing, towels, stuffed toys, and bed linens should be washed in hot water and then dried in a dryer for at least 20 minutes on high heat. Items that cannot be washed should be enclosed in a plastic bag for at least 3 days.  To help relieve itching, bathe your child in a cool  bath or apply cool washcloths to the affected areas.  Your child may return to school after treatment with the prescribed cream. SEEK MEDICAL CARE IF:   The itching persists longer than 4 weeks after treatment.  The rash spreads or becomes infected. Signs of infection include red blisters or yellow-tan crust. Document Released: 12/19/2004 Document Revised: 03/13/2011 Document Reviewed: 04/29/2008 [facility] Patient Information 2015 [place], [place]. This information is not intended to replace advice given to you by your health care provider. Make sure you discuss any questions you have with your health care provider.

## 2013-09-11 NOTE — ED Provider Notes (Signed)
CSN: [id number]     Arrival date & time 09/11/13  1858 History   First MD Initiated Contact with Patient 09/11/13 1905     Chief Complaint  Patient presents with  . Rash     (Consider location/radiation/quality/duration/timing/severity/associated sxs/prior Treatment) HPI Comments: Patient is a 33 year old female who presents to the emergency department complaining of an itchy rash times one to 2 months. Patient reports she noticed spots on both of her arms, legs, chest, ankles in between her toes. She was exposed to somebody with bed bugs 2 months ago, however states she moved to a different home and has not seen any bugs. Denies any new soaps, detergents, lotions. No known allergies. Her daughter has similar symptoms. Denies difficulty breathing or swallowing. No fevers.  Patient is a 33 y.o. female presenting with rash. The history is provided by the patient.  Rash   Past Medical History  Diagnosis Date  . Kidney stone   . Pregnancy induced hypertension   . Anemia   . Urinary tract infection   . Abnormal Pap smear   . Chlamydia   . Trichomonas    Past Surgical History  Procedure Laterality Date  . Induced abortion    . Colposcopy     Family History  Problem Relation Age of Onset  . Anesthesia problems Neg Hx   . Mental illness Mother    History  Substance Use Topics  . Smoking status: Current Every Day Smoker -- 0.25 packs/day for 10 years    Types: Cigarettes  . Smokeless tobacco: Never Used  . Alcohol Use: No   OB History   Grav Para Term Preterm Abortions TAB SAB Ect Mult Living   0 Review of Systems  Skin: Positive for rash.  All other systems reviewed and are negative.     Allergies  Review of patient's allergies indicates no known allergies.  Home Medications   Prior to Admission medications   Medication Sig Start Date End Date Taking? Authorizing Provider  aspirin 325 MG tablet Take 325 mg by mouth daily.    Historical  Provider, MD  ciprofloxacin (CIPRO) 500 MG tablet Take 1 tablet (500 mg total) by mouth 2 (two) times daily. 05/23/12   Heather Alger Memos, CNM  diphenhydrAMINE (BENADRYL) 25 MG tablet Take 1 tablet (25 mg total) by mouth every 6 (six) hours. 09/11/13   Trevor Mace, PA-C  permethrin (ELIMITE) 5 % cream Apply to affected area once 09/11/13   Trevor Mace, PA-C  Pseudoeph-Doxylamine-DM-APAP (NYQUIL PO) Take by mouth.    Historical Provider, MD   BP 130/92  Pulse 82  Temp(Src) 98.6 F (37 C) (Oral)  Resp 16  Wt 196 lb 11.2 oz (89.223 kg)  SpO2 100%  LMP 09/04/2013 Physical Exam  Nursing note and vitals reviewed. Constitutional: She is oriented to person, place, and time. She appears well-developed and well-nourished. No distress.  HENT:  Head: Normocephalic and atraumatic.  Mouth/Throat: Oropharynx is clear and moist.  Eyes: Conjunctivae are normal.  Neck: Normal range of motion. Neck supple.  Cardiovascular: Normal rate, regular rhythm and normal heart sounds.   Pulmonary/Chest: Effort normal and breath sounds normal.  Musculoskeletal: Normal range of motion. She exhibits no edema.  Neurological: She is alert and oriented to person, place, and time.  Skin: Skin is warm and dry. She is not diaphoretic.  Scattered tiny, raised macular lesions on bilateral arms, chest, lower legs,  between web spaces of fingers and toes. Spares palms of hands and soles of feet.  Psychiatric: She has a normal mood and affect. Her behavior is normal.    ED Course  Procedures (including critical care time) Labs Review Labs Reviewed - No data to display  Imaging Review No results found.   EKG Interpretation None      MDM   Final diagnoses:  Scabies  Rash   Rash consistent with scabies. Daughter present with similar symptoms. No respiratory or airway compromise. Treat with permethrin. Infection care and precautions discussed. Stable for discharge. Return precautions given. Patient states  understanding of treatment care plan and is agreeable.  Trevor Mace, PA-C 09/11/13 [phone]

## 2013-09-12 NOTE — ED Provider Notes (Signed)
Medical screening examination/treatment/procedure(s) were performed by non-physician practitioner and as supervising physician I was immediately available for consultation/collaboration.   EKG Interpretation None        Ulyssa Walthour, DO 09/12/13 0009 

## 2013-10-21 ENCOUNTER — Encounter (HOSPITAL_COMMUNITY): Payer: Self-pay | Admitting: *Deleted

## 2013-10-21 ENCOUNTER — Inpatient Hospital Stay (HOSPITAL_COMMUNITY)
Admission: AD | Admit: 2013-10-21 | Discharge: 2013-10-21 | Disposition: A | Discharge status: Home / Self Care | Payer: Self-pay | Source: Ambulatory Visit | Attending: Obstetrics & Gynecology | Admitting: Obstetrics & Gynecology

## 2013-10-21 DIAGNOSIS — B9689 Other specified bacterial agents as the cause of diseases classified elsewhere: Secondary | ICD-10-CM | POA: Insufficient documentation

## 2013-10-21 DIAGNOSIS — N3000 Acute cystitis without hematuria: Secondary | ICD-10-CM

## 2013-10-21 DIAGNOSIS — N390 Urinary tract infection, site not specified: Secondary | ICD-10-CM | POA: Insufficient documentation

## 2013-10-21 DIAGNOSIS — R030 Elevated blood-pressure reading, without diagnosis of hypertension: Secondary | ICD-10-CM | POA: Insufficient documentation

## 2013-10-21 LAB — POCT PREGNANCY, URINE: PREG TEST UR: NEGATIVE

## 2013-10-21 LAB — URINE MICROSCOPIC-ADD ON

## 2013-10-21 LAB — URINALYSIS, ROUTINE W REFLEX MICROSCOPIC
BILIRUBIN URINE: NEGATIVE
Glucose, UA: NEGATIVE mg/dL
KETONES UR: NEGATIVE mg/dL
Nitrite: NEGATIVE
PROTEIN: NEGATIVE mg/dL
Specific Gravity, Urine: 1.025 (ref 1.005–1.030)
UROBILINOGEN UA: 0.2 mg/dL (ref 0.0–1.0)
pH: 5.5 (ref 5.0–8.0)

## 2013-10-21 MED ORDER — PHENAZOPYRIDINE HCL 200 MG PO TABS: 200.0000 mg | ORAL_TABLET | Freq: Three times a day (TID) | ORAL | Status: DC | PRN

## 2013-10-21 MED ORDER — NITROFURANTOIN MONOHYD MACRO 100 MG PO CAPS: 100.0000 mg | ORAL_CAPSULE | Freq: Two times a day (BID) | ORAL | Status: DC

## 2013-10-21 NOTE — MAU Provider Note (Signed)
History     CSN: [id number]  Arrival date and time: 10/21/13 2042   None     Chief Complaint  Patient presents with  . Urinary Tract Infection  . Abdominal Pain   Urinary Tract Infection  Associated symptoms include frequency and urgency. Pertinent negatives include no chills, hematuria, nausea or vomiting.  Abdominal Pain Associated symptoms include dysuria and frequency. Pertinent negatives include no diarrhea, fever, hematuria, nausea or vomiting.    UTI: increased frequency, incomplete urination since last night, burning when urinates.  Denies hematuria.  Some back pain associated with symptoms.  Feels like previous UTI.  Also notes lower abdominal cramping with onset of symptoms. - no fevers/sweats/chills  Past Medical History  Diagnosis Date  . Kidney stone   . Pregnancy induced hypertension   . Anemia   . Urinary tract infection   . Abnormal Pap smear   . Chlamydia   . Trichomonas     Past Surgical History  Procedure Laterality Date  . Induced abortion    . Colposcopy      Family History  Problem Relation Age of Onset  . Anesthesia problems Neg Hx   . Mental illness Mother     History  Substance Use Topics  . Smoking status: Current Every Day Smoker -- 0.25 packs/day for 10 years    Types: Cigarettes  . Smokeless tobacco: Never Used  . Alcohol Use: No    Allergies: No Known Allergies  Prescriptions prior to admission  Medication Sig Dispense Refill  . aspirin 325 MG tablet Take 325 mg by mouth every 6 (six) hours as needed for moderate pain.         Review of Systems  Constitutional: Negative for fever and chills.  Respiratory: Negative for cough and shortness of breath.   Cardiovascular: Negative for chest pain and leg swelling.  Gastrointestinal: Positive for abdominal pain. Negative for heartburn, nausea, vomiting and diarrhea.  Genitourinary: Positive for dysuria, urgency and frequency. Negative for hematuria.  Neurological:       No  headache   Physical Exam   Blood pressure 132/97, pulse 92, temperature 99.6 F (37.6 C), temperature source Oral, resp. rate 18, height 5\' 7"  (1.702 m), weight 203 lb 4 oz (92.194 kg), last menstrual period 09/30/2013.  Physical Exam  MAU Course  Procedures  MDM  Labs: Results for orders placed during the hospital encounter of 10/21/13 (from the past 24 hour(s))  URINALYSIS, ROUTINE W REFLEX MICROSCOPIC   Collection Time    10/21/13  9:00 PM      Result Value Ref Range   Color, Urine YELLOW  YELLOW   APPearance CLEAR  CLEAR   Specific Gravity, Urine 1.025  1.005 - 1.030   pH 5.5  5.0 - 8.0   Glucose, UA NEGATIVE  NEGATIVE mg/dL   Hgb urine dipstick TRACE (*) NEGATIVE   Bilirubin Urine NEGATIVE  NEGATIVE   Ketones, ur NEGATIVE  NEGATIVE mg/dL   Protein, ur NEGATIVE  NEGATIVE mg/dL   Urobilinogen, UA 0.2  0.0 - 1.0 mg/dL   Nitrite NEGATIVE  NEGATIVE   Leukocytes, UA MODERATE (*) NEGATIVE  URINE MICROSCOPIC-ADD ON   Collection Time    10/21/13  9:00 PM      Result Value Ref Range   Squamous Epithelial / LPF MANY (*) RARE   WBC, UA 7-10  <3 WBC/hpf   RBC / HPF 0-2  <3 RBC/hpf   Bacteria, UA FEW (*) RARE  POCT PREGNANCY, URINE   Collection  Time    10/21/13  9:13 PM      Result Value Ref Range   Preg Test, Ur NEGATIVE  NEGATIVE    Imaging Studies:  No results found.    Assessment and Plan  33 yo female here with frequency/urgency/dysuria with UTI - rx macrobid, no allergies - rx pyridium - urine culture - elevated blood pressure: advised diet/exercise/salt restriction and to establish care with PCP for further evaluation/treatment  Carmen Johnston 10/21/2013, 10:41 PM

## 2013-10-21 NOTE — MAU Note (Signed)
PT  SAYS SHE THINKS  SHE HAS UTI-  HAS HX-  HAD UTI 1  -2 YEARS AGO.       [facility]      [facility].    UTI-   VOIDS OFTEN-  DOES  NOT FEEL LIKE EMPTY-   ABD CRAMPING.       NO BIRTH CONTROL.  LAST SEX-  10-6

## 2013-10-21 NOTE — MAU Note (Signed)
Patient stated UTI/abdomal pain started 2 weeks ago and got worse.

## 2013-10-21 NOTE — Discharge Instructions (Signed)

## 2013-10-22 NOTE — MAU Provider Note (Signed)
Attestation of Attending Supervision of Obstetric Fellow: Evaluation and management procedures were performed by the Obstetric Fellow under my supervision and collaboration.  I have reviewed the Obstetric Fellow's note and chart, and I agree with the management and plan.  Annick Dimaio, MD, FACOG Attending Obstetrician & Gynecologist Faculty Practice, [facility]   

## 2013-11-03 ENCOUNTER — Encounter (HOSPITAL_COMMUNITY): Payer: Self-pay | Admitting: *Deleted

## 2014-08-13 ENCOUNTER — Emergency Department (HOSPITAL_COMMUNITY)
Admission: EM | Admit: 2014-08-13 | Discharge: 2014-08-13 | Disposition: A | Discharge status: Home / Self Care | Payer: Self-pay | Attending: Emergency Medicine | Admitting: Emergency Medicine

## 2014-08-13 ENCOUNTER — Encounter (HOSPITAL_COMMUNITY): Payer: Self-pay | Admitting: Emergency Medicine

## 2014-08-13 DIAGNOSIS — Z8619 Personal history of other infectious and parasitic diseases: Secondary | ICD-10-CM | POA: Insufficient documentation

## 2014-08-13 DIAGNOSIS — Z862 Personal history of diseases of the blood and blood-forming organs and certain disorders involving the immune mechanism: Secondary | ICD-10-CM | POA: Insufficient documentation

## 2014-08-13 DIAGNOSIS — Z87440 Personal history of urinary (tract) infections: Secondary | ICD-10-CM | POA: Insufficient documentation

## 2014-08-13 DIAGNOSIS — R202 Paresthesia of skin: Secondary | ICD-10-CM | POA: Insufficient documentation

## 2014-08-13 DIAGNOSIS — Z720 Tobacco use: Secondary | ICD-10-CM | POA: Insufficient documentation

## 2014-08-13 DIAGNOSIS — Z79899 Other long term (current) drug therapy: Secondary | ICD-10-CM | POA: Insufficient documentation

## 2014-08-13 DIAGNOSIS — Z7982 Long term (current) use of aspirin: Secondary | ICD-10-CM | POA: Insufficient documentation

## 2014-08-13 DIAGNOSIS — Z87442 Personal history of urinary calculi: Secondary | ICD-10-CM | POA: Insufficient documentation

## 2014-08-13 MED ORDER — GABAPENTIN 100 MG PO CAPS: 300.0000 mg | ORAL_CAPSULE | Freq: Three times a day (TID) | ORAL | Status: DC

## 2014-08-13 MED ORDER — GABAPENTIN 600 MG PO TABS
300.0000 mg | ORAL_TABLET | Freq: Once | ORAL | Status: DC
  Filled 2014-08-13: qty 0.5

## 2014-08-13 MED ORDER — GABAPENTIN 300 MG PO CAPS
300.0000 mg | ORAL_CAPSULE | Freq: Once | ORAL | Status: AC
  Administered 2014-08-13: 300 mg via ORAL
  Filled 2014-08-13: qty 1

## 2014-08-13 NOTE — Discharge Instructions (Signed)
Paresthesia Ms. Zhan, take gabapentin as directed for your numbness. See neurologist within 3 days for close follow-up. If symptoms worsen come back to emergency department immediately. Thank you. Paresthesia is a burning or prickling feeling. This feeling can happen in any part of the body. It often happens in the hands, arms, legs, or feet. HOME CARE  Avoid drinking alcohol.  Try massage or needle therapy (acupuncture) to help with your problems.  Keep all doctor visits as told. GET HELP RIGHT AWAY IF:   You feel weak.  You have trouble walking or moving.  You have problems speaking or seeing.  You feel confused.  You cannot control when you poop (bowel movement) or pee (urinate).  You lose feeling (numbness) after an injury.  You pass out (faint).  Your burning or prickling feeling gets worse when you walk.  You have pain, cramps, or feel dizzy.  You have a rash. MAKE SURE YOU:   Understand these instructions.  Will watch your condition.  Will get help right away if you are not doing well or get worse. Document Released: 12/02/2007 Document Revised: 03/13/2011 Document Reviewed: 09/09/2010 [facility] Patient Information 2015 [place], [place]. This information is not intended to replace advice given to you by your health care provider. Make sure you discuss any questions you have with your health care provider.

## 2014-08-13 NOTE — ED Notes (Signed)
Pt. reports left leg numbness and left thigh pain onset last Friday , denies injury / ambulatory .

## 2014-08-13 NOTE — ED Provider Notes (Signed)
CSN: [id number]     Arrival date & time 08/13/14  1478 History  This chart was scribed for Tomasita Crumble, MD by Freida Busman, ED Scribe. This patient was seen in room D31C/D31C and the patient's care was started 2:45 AM.    Chief Complaint  Patient presents with  . Numbness    The history is provided by the patient. No language interpreter was used.     HPI Comments:  Carmen Johnston is a 34 y.o. female who presents to the Emergency Department complaining of mild-moderate numbness down her LLE  for ~6 days. She reports associated pain in her left thigh. She denies LUE numbness, bowel/bladder incontinence. No alleviating factors noted.She denies injruy, h/o CA, DM and IVDA. Patient denies any trauma.   Past Medical History  Diagnosis Date  . Kidney stone   . Pregnancy induced hypertension   . Anemia   . Urinary tract infection   . Abnormal Pap smear   . Chlamydia   . Trichomonas    Past Surgical History  Procedure Laterality Date  . Induced abortion    . Colposcopy     Family History  Problem Relation Age of Onset  . Anesthesia problems Neg Hx   . Mental illness Mother    Social History  Substance Use Topics  . Smoking status: Current Every Day Smoker -- 0.25 packs/day for 0 years    Types: Cigarettes  . Smokeless tobacco: Never Used  . Alcohol Use: No   OB History    Gravida Para Term Preterm AB TAB SAB Ectopic Multiple Living   0 Review of Systems  A complete 10 system review of systems was obtained and all systems are negative except as noted in the HPI and PMH.    Allergies  Review of patient's allergies indicates no known allergies.  Home Medications   Prior to Admission medications   Medication Sig Start Date End Date Taking? Authorizing Provider  aspirin 325 MG tablet Take 325 mg by mouth every 6 (six) hours as needed for moderate pain.     Historical Provider, MD  nitrofurantoin, macrocrystal-monohydrate, (MACROBID) 100 MG capsule  Take 1 capsule (100 mg total) by mouth 2 (two) times daily. 10/21/13   Fredirick Lathe, MD  phenazopyridine (PYRIDIUM) 200 MG tablet Take 1 tablet (200 mg total) by mouth 3 (three) times daily as needed for pain. 10/21/13   Fredirick Lathe, MD   BP 126/84 mmHg  Pulse 82  Temp(Src) 98.3 F (36.8 C) (Oral)  Resp 18  SpO2 98%  LMP 07/23/2014 Physical Exam  Constitutional: She is oriented to person, place, and time. She appears well-developed and well-nourished. No distress.  HENT:  Head: Normocephalic and atraumatic.  Nose: Nose normal.  Mouth/Throat: Oropharynx is clear and moist. No oropharyngeal exudate.  Eyes: Conjunctivae and EOM are normal. Pupils are equal, round, and reactive to light. No scleral icterus.  Neck: Normal range of motion. Neck supple. No JVD present. No tracheal deviation present. No thyromegaly present.  Cardiovascular: Normal rate, regular rhythm and normal heart sounds.  Exam reveals no gallop and no friction rub.   No murmur heard. Pulmonary/Chest: Effort normal and breath sounds normal. No respiratory distress. She has no wheezes. She exhibits no tenderness.  Abdominal: Soft. Bowel sounds are normal. She exhibits no distension and no mass. There is no tenderness. There is no rebound and no guarding.  Musculoskeletal: Normal range of motion.  She exhibits no edema or tenderness.  Lymphadenopathy:    She has no cervical adenopathy.  Neurological: She is alert and oriented to person, place, and time. No cranial nerve deficit. She exhibits normal muscle tone.  Skin: Skin is warm and dry. No rash noted. No erythema. No pallor.  Nursing note and vitals reviewed.   ED Course  Procedures   DIAGNOSTIC STUDIES:  Oxygen Saturation is 98% on RA, normal by my interpretation.    COORDINATION OF CARE:  2:49 AM Discussed treatment plan with pt at bedside and pt agreed to plan.  Labs Review Labs Reviewed - No data to display  Imaging Review No results found.   EKG  Interpretation None      MDM   Final diagnoses:  None    Patient presents emergency department for numbness of her left leg. This been going on for one week. She denies any other neurological symptoms. She denies any new back pain.  She has no risk factors of malignancy or IV drug use. I do not believe an acute emergent medical condition currently exists. Will place patient on a trial of gabapentin and give neurology follow-up. She otherwise appears well and in no acute distress. Vital signs remain within her normal limits and she is safe for discharge.  I personally performed the services described in this documentation, which was scribed in my presence. The recorded information has been reviewed and is accurate.    Tomasita Crumble, MD 08/13/14 [phone]

## 2014-09-28 ENCOUNTER — Ambulatory Visit: Payer: Self-pay | Admitting: Neurology

## 2014-11-26 ENCOUNTER — Emergency Department (HOSPITAL_COMMUNITY)
Admission: EM | Admit: 2014-11-26 | Discharge: 2014-11-26 | Disposition: A | Discharge status: Home / Self Care | Payer: Self-pay | Attending: Emergency Medicine | Admitting: Emergency Medicine

## 2014-11-26 ENCOUNTER — Encounter (HOSPITAL_COMMUNITY): Payer: Self-pay | Admitting: Emergency Medicine

## 2014-11-26 DIAGNOSIS — Z87442 Personal history of urinary calculi: Secondary | ICD-10-CM | POA: Insufficient documentation

## 2014-11-26 DIAGNOSIS — Z7982 Long term (current) use of aspirin: Secondary | ICD-10-CM | POA: Insufficient documentation

## 2014-11-26 DIAGNOSIS — Z3202 Encounter for pregnancy test, result negative: Secondary | ICD-10-CM | POA: Insufficient documentation

## 2014-11-26 DIAGNOSIS — Z862 Personal history of diseases of the blood and blood-forming organs and certain disorders involving the immune mechanism: Secondary | ICD-10-CM | POA: Insufficient documentation

## 2014-11-26 DIAGNOSIS — Z8619 Personal history of other infectious and parasitic diseases: Secondary | ICD-10-CM | POA: Insufficient documentation

## 2014-11-26 DIAGNOSIS — F17210 Nicotine dependence, cigarettes, uncomplicated: Secondary | ICD-10-CM | POA: Insufficient documentation

## 2014-11-26 DIAGNOSIS — N390 Urinary tract infection, site not specified: Secondary | ICD-10-CM | POA: Insufficient documentation

## 2014-11-26 DIAGNOSIS — Z79899 Other long term (current) drug therapy: Secondary | ICD-10-CM | POA: Insufficient documentation

## 2014-11-26 LAB — URINALYSIS, ROUTINE W REFLEX MICROSCOPIC
GLUCOSE, UA: NEGATIVE mg/dL
KETONES UR: 15 mg/dL — AB
NITRITE: POSITIVE — AB
PH: 5 (ref 5.0–8.0)
Protein, ur: 30 mg/dL — AB
Specific Gravity, Urine: 1.018 (ref 1.005–1.030)

## 2014-11-26 LAB — URINE MICROSCOPIC-ADD ON: Squamous Epithelial / LPF: NONE SEEN

## 2014-11-26 LAB — POC URINE PREG, ED: Preg Test, Ur: NEGATIVE

## 2014-11-26 MED ORDER — PROMETHAZINE HCL 25 MG PO TABS: 25.0000 mg | ORAL_TABLET | Freq: Four times a day (QID) | ORAL | Status: DC | PRN

## 2014-11-26 MED ORDER — NITROFURANTOIN MONOHYD MACRO 100 MG PO CAPS: 100.0000 mg | ORAL_CAPSULE | Freq: Two times a day (BID) | ORAL | Status: DC

## 2014-11-26 NOTE — ED Notes (Signed)
Pt reports urinary urgency x 3 days. sts she feels like she has a UTI. Denies vaginal discharge/bleeding.

## 2014-11-26 NOTE — ED Provider Notes (Signed)
CSN: [id number]     Arrival date & time 11/26/14  0236 History  By signing my name below, I, Freida Busman, attest that this documentation has been prepared under the direction and in the presence of Derwood Kaplan, MD . Electronically Signed: Freida Busman, Scribe. 11/26/2014. 4:01 AM.     Chief Complaint  Patient presents with  . Urinary Urgency    The history is provided by the patient. No language interpreter was used.     HPI Comments:  Carmen Johnston is a 34 y.o. female who presents to the Emergency Department complaining of urinary urgency for 3 days. She reports associated dysuria, describes as pulling sensation, frequent urination, mild lower back and lower abdominal pain. She denies fever, chills, and vaginal discharge. Pt has no concern for STD as she is in a monogamous relationship. She is currenlty on her period. No alleviating factors noted.  Past Medical History  Diagnosis Date  . Kidney stone   . Pregnancy induced hypertension   . Anemia   . Urinary tract infection   . Abnormal Pap smear   . Chlamydia   . Trichomonas    Past Surgical History  Procedure Laterality Date  . Induced abortion    . Colposcopy     Family History  Problem Relation Age of Onset  . Anesthesia problems Neg Hx   . Mental illness Mother    Social History  Substance Use Topics  . Smoking status: Current Every Day Smoker -- 0.25 packs/day for 0 years    Types: Cigarettes  . Smokeless tobacco: Never Used  . Alcohol Use: No   OB History    Gravida Para Term Preterm AB TAB SAB Ectopic Multiple Living   0 Review of Systems  10 systems reviewed and all are negative for acute change except as noted in the HPI.   Allergies  Review of patient's allergies indicates no known allergies.  Home Medications   Prior to Admission medications   Medication Sig Start Date End Date Taking? Authorizing Provider  aspirin 325 MG tablet Take 325 mg by mouth every 6 (six)  hours as needed for moderate pain.     Historical Provider, MD  gabapentin (NEURONTIN) 100 MG capsule Take 3 capsules (300 mg total) by mouth 3 (three) times daily. 08/13/14   Tomasita Crumble, MD  nitrofurantoin, macrocrystal-monohydrate, (MACROBID) 100 MG capsule Take 1 capsule (100 mg total) by mouth 2 (two) times daily. 11/26/14   Derwood Kaplan, MD  promethazine (PHENERGAN) 25 MG tablet Take 1 tablet (25 mg total) by mouth every 6 (six) hours as needed for nausea. 11/26/14   Ermine Stebbins, MD   BP 130/96 mmHg  Pulse 68  Temp(Src) 98 F (36.7 C) (Oral)  Resp 18  SpO2 100%  LMP 11/25/2014 Physical Exam  Constitutional: She is oriented to person, place, and time. She appears well-developed and well-nourished. No distress.  HENT:  Head: Normocephalic and atraumatic.  Eyes: Conjunctivae are normal.  Cardiovascular: Normal rate, regular rhythm and normal heart sounds.   Pulmonary/Chest: Effort normal.  Abdominal: Soft. She exhibits no distension. There is no tenderness.  Neurological: She is alert and oriented to person, place, and time.  Skin: Skin is warm and dry.  Psychiatric: She has a normal mood and affect.  Nursing note and vitals reviewed.   ED Course  Procedures   DIAGNOSTIC STUDIES:  Oxygen Saturation is 99% on RA, normal by  my interpretation.    COORDINATION OF CARE:  3:48 AM Will discharge with abx and nausea med. Discussed treatment plan with pt at bedside and pt agreed to plan.  Labs Review Labs Reviewed  URINALYSIS, ROUTINE W REFLEX MICROSCOPIC (NOT AT [facility]) - Abnormal; Notable for the following:    Color, Urine RED (*)    APPearance CLOUDY (*)    Hgb urine dipstick LARGE (*)    Bilirubin Urine SMALL (*)    Ketones, ur 15 (*)    Protein, ur 30 (*)    Nitrite POSITIVE (*)    Leukocytes, UA LARGE (*)    All other components within normal limits  URINE MICROSCOPIC-ADD ON - Abnormal; Notable for the following:    Bacteria, UA FEW (*)    All other components  within normal limits  POC URINE PREG, ED    Imaging Review No results found. I have personally reviewed and evaluated these lab results as part of my medical decision-making.   EKG Interpretation None      MDM   Final diagnoses:  UTI (lower urinary tract infection)   I personally performed the services described in this documentation, which was scribed in my presence. The recorded information has been reviewed and is accurate.  Pt with urinary frequency, urgency, pain and hematuria. UA is + Will tx with macrobid as uncomplicated cystitis.   Derwood KaplanAnkit Ecko Beasley, MD 11/26/14 [phone]

## 2014-11-26 NOTE — Discharge Instructions (Signed)

## 2015-11-09 ENCOUNTER — Encounter (HOSPITAL_COMMUNITY): Payer: Self-pay | Admitting: Emergency Medicine

## 2015-11-09 ENCOUNTER — Emergency Department (HOSPITAL_COMMUNITY): Payer: Medicaid Other

## 2015-11-09 ENCOUNTER — Emergency Department (HOSPITAL_COMMUNITY)
Admission: EM | Admit: 2015-11-09 | Discharge: 2015-11-09 | Disposition: A | Discharge status: Home / Self Care | Payer: Medicaid Other | Attending: Emergency Medicine | Admitting: Emergency Medicine

## 2015-11-09 DIAGNOSIS — F17210 Nicotine dependence, cigarettes, uncomplicated: Secondary | ICD-10-CM | POA: Diagnosis not present

## 2015-11-09 DIAGNOSIS — Z79899 Other long term (current) drug therapy: Secondary | ICD-10-CM | POA: Insufficient documentation

## 2015-11-09 DIAGNOSIS — R05 Cough: Secondary | ICD-10-CM | POA: Diagnosis present

## 2015-11-09 DIAGNOSIS — J40 Bronchitis, not specified as acute or chronic: Secondary | ICD-10-CM

## 2015-11-09 DIAGNOSIS — J029 Acute pharyngitis, unspecified: Secondary | ICD-10-CM | POA: Insufficient documentation

## 2015-11-09 DIAGNOSIS — Z7982 Long term (current) use of aspirin: Secondary | ICD-10-CM | POA: Insufficient documentation

## 2015-11-09 LAB — RAPID STREP SCREEN (MED CTR MEBANE ONLY): Streptococcus, Group A Screen (Direct): NEGATIVE

## 2015-11-09 MED ORDER — ALBUTEROL SULFATE HFA 108 (90 BASE) MCG/ACT IN AERS
2.0000 | INHALATION_SPRAY | RESPIRATORY_TRACT | Status: DC | PRN
  Administered 2015-11-09: 2 via RESPIRATORY_TRACT
  Filled 2015-11-09: qty 6.7

## 2015-11-09 MED ORDER — AZITHROMYCIN 250 MG PO TABS: 250.0000 mg | ORAL_TABLET | Freq: Every day | ORAL | 0 refills | Status: DC

## 2015-11-09 MED ORDER — PREDNISONE 10 MG PO TABS: 20.0000 mg | ORAL_TABLET | Freq: Two times a day (BID) | ORAL | 0 refills | Status: DC

## 2015-11-09 NOTE — ED Provider Notes (Signed)
[facility]-EMERGENCY DEPT Provider Note   CSN: [id number] Arrival date & time: 11/09/15  1811  By signing my name below, I, Carmen Johnston, attest that this documentation has been prepared under the direction and in the presence of [facility], [place].  Electronically Signed: Rosario Johnston, ED Scribe. 11/09/15. 7:06 PM.  History   Chief Complaint Chief Complaint  Patient presents with  . Cough  . Nasal Congestion   The history is provided by the patient. No language interpreter was used.  Cough  This is a new problem. The current episode started 2 days ago. The problem has been gradually worsening. The cough is productive of sputum. Maximum temperature: subjective. The fever has been present for 1 to 2 days. Associated symptoms include rhinorrhea and sore throat. Pertinent negatives include no ear pain. Treatments tried: Ibuprofen. The treatment provided mild relief. Her past medical history does not include bronchitis, pneumonia, bronchiectasis, COPD, emphysema or asthma.    HPI Comments: Carmen Johnston is a 35 y.o. female who presents to the Emergency Department complaining of gradually worsening, persistent cough w/ yellow sputum onset approximately 2 days ago. She reports associated subjective fever, rhinorrhea, nasal congestion, increased tearing, lower back pain, frequency, and sore throat secondary to her cough. Pt has been taking 800mg  Ibuprofen with mild relief of her current symptoms, but note that her symptoms have since worsened. She is approximately one week s/p therapeutic abortion procedure, and was recently on a short course of antibiotic which she was compliant with and has finished. She notes that she still is spotting and has had mild, cramping abdominal pain since this procedure was performed, however, denies any recent or abnormal vaginal discharge otherwise. Her children have been sick with similar symptoms. Denies nausea, vomiting, ear pain, or any other associated  symptoms.   Past Medical History:  Diagnosis Date  . Abnormal Pap smear   . Anemia   . Chlamydia   . Kidney stone   . Pregnancy induced hypertension   . Trichomonas   . Urinary tract infection    There are no active problems to display for this patient.  Past Surgical History:  Procedure Laterality Date  . COLPOSCOPY    . INDUCED ABORTION     OB History    Gravida Para Term Preterm AB Living   7 4 4  0 3 4   SAB TAB Ectopic Multiple Live Births   1 1 1   4      Home Medications    Prior to Admission medications   Medication Sig Start Date End Date Taking? Authorizing Provider  aspirin 325 MG tablet Take 325 mg by mouth every 6 (six) hours as needed for moderate pain.     Historical Provider, MD  azithromycin (ZITHROMAX) 250 MG tablet Take 1 tablet (250 mg total) by mouth daily. Take first 2 tablets together, then 1 every day until finished. 11/09/15   Sylvana Bonk Orlene Och, NP  gabapentin (NEURONTIN) 100 MG capsule Take 3 capsules (300 mg total) by mouth 3 (three) times daily. 08/13/14   Tomasita Crumble, MD  nitrofurantoin, macrocrystal-monohydrate, (MACROBID) 100 MG capsule Take 1 capsule (100 mg total) by mouth 2 (two) times daily. 11/26/14   Derwood Kaplan, MD  predniSONE (DELTASONE) 10 MG tablet Take 2 tablets (20 mg total) by mouth 2 (two) times daily with a meal. 11/09/15   Kamali Sakata Orlene Och, NP  promethazine (PHENERGAN) 25 MG tablet Take 1 tablet (25 mg total) by mouth every 6 (six) hours as  needed for nausea. 11/26/14   Derwood KaplanAnkit Nanavati, MD   Family History Family History  Problem Relation Age of Onset  . Anesthesia problems Neg Hx   . Mental illness Mother    Social History Social History  Substance Use Topics  . Smoking status: Current Every Day Smoker    Packs/day: 0.25    Years: 0.00    Types: Cigarettes  . Smokeless tobacco: Never Used  . Alcohol use No   Allergies   Patient has no known allergies.  Review of Systems Review of Systems  Constitutional: Positive for fever.   HENT: Positive for congestion, rhinorrhea, sinus pressure and sore throat. Negative for ear pain.   Respiratory: Positive for cough.   Gastrointestinal: Positive for abdominal pain (cramping, mild). Negative for nausea and vomiting.  Genitourinary: Positive for vaginal bleeding (spotting). Negative for vaginal discharge.  All other systems reviewed and are negative.  Physical Exam Updated Vital Signs BP (!) 155/102 (BP Location: Right Arm)   Pulse 98   Temp 97.9 F (36.6 C) (Oral)   Resp 18   Ht 5\' 7"  (1.702 m)   Wt 95.3 kg   SpO2 100%   BMI 32.89 kg/m   Physical Exam  Constitutional: She appears well-developed and well-nourished. No distress.  HENT:  Head: Normocephalic.  Right Ear: External ear normal.  Left Ear: External ear normal.  Mouth/Throat: Uvula is midline and mucous membranes are normal. No trismus in the jaw. Posterior oropharyngeal erythema (mild) present. No posterior oropharyngeal edema. No tonsillar exudate.  Eyes: Conjunctivae and EOM are normal. Pupils are equal, round, and reactive to light. Right eye exhibits no discharge. Left eye exhibits no discharge. No scleral icterus.  Neck: Normal range of motion. Neck supple.  No meningeal signs.   Cardiovascular: Normal rate and regular rhythm.   Pulmonary/Chest: Effort normal. No respiratory distress. She has decreased breath sounds. Wheezes: occasional.  Abdominal: Soft. There is no tenderness.  Musculoskeletal: Normal range of motion.  Lymphadenopathy:    She has no cervical adenopathy.  Neurological: She is alert.  Skin: Skin is warm and dry.  Psychiatric: She has a normal mood and affect. Her behavior is normal.  Nursing note and vitals reviewed.  ED Treatments / Results  DIAGNOSTIC STUDIES: Oxygen Saturation is 100% on RA, normal by my interpretation.   COORDINATION OF CARE: 6:57 PM-Discussed next steps with pt. Pt verbalized understanding and is agreeable with the plan.   Labs (all labs ordered  are listed, but only abnormal results are displayed) Labs Reviewed  RAPID STREP SCREEN (NOT AT [facility])    Radiology Dg Chest 2 View  Result Date: 11/09/2015 CLINICAL DATA:  Right-sided chest pain and productive cough for 2 days EXAM: CHEST  2 VIEW COMPARISON:  03/26/2003 FINDINGS: The heart size and mediastinal contours are within normal limits. Both lungs are clear. The visualized skeletal structures are unremarkable. IMPRESSION: No active cardiopulmonary disease. Electronically Signed   By: Alcide CleverMark  Lukens M.D.   On: 11/09/2015 19:50    Procedures Procedures  Albuterol inhaler with instructions given to patient prior to d/c.  Medications Ordered in ED Medications  albuterol (PROVENTIL HFA;VENTOLIN HFA) 108 (90 Base) MCG/ACT inhaler 2 puff (not administered)    Initial Impression / Assessment and Plan / ED Course  I have reviewed the triage vital signs and the nursing notes.  Pertinent labs & imaging results that were available during my care of the patient were reviewed by me and considered in my medical decision making (see  chart for details).  Clinical Course    This is a 35yo female who is one week s/p TAB procedure with symptoms that are likely URI-related with sick contacts including her children. Pt CXR negative for acute infiltrate. Pt will be discharged with symptomatic treatment.  Verbalizes understanding and is agreeable with plan. Pt is hemodynamically stable & in NAD prior to dc.Pt is comfortable with above plan and is stable for discharge at this time. All questions were answered prior to disposition. Strict return precautions for f/u into the ED were discussed.   Final Clinical Impressions(s) / ED Diagnoses   Final diagnoses:  Bronchitis  Sore throat   New Prescriptions New Prescriptions   AZITHROMYCIN (ZITHROMAX) 250 MG TABLET    Take 1 tablet (250 mg total) by mouth daily. Take first 2 tablets together, then 1 every day until finished.   PREDNISONE (DELTASONE) 10 MG  TABLET    Take 2 tablets (20 mg total) by mouth 2 (two) times daily with a meal.   I personally performed the services described in this documentation, which was scribed in my presence. The recorded information has been reviewed and is accurate.      [place]oe[place] M Ferdinand Revoir, NP 11/09/15 2049    Rolland PorterMark James, MD 11/30/15 [phone]

## 2015-11-09 NOTE — ED Notes (Signed)
Patient transported to X-ray 

## 2015-11-09 NOTE — Discharge Instructions (Signed)
You can take Robitussin in addition to the medications we give you.  Use the inhaler as needed for wheezing or feeling short of breath. Follow up with your doctor or return here as needed.

## 2015-11-09 NOTE — ED Triage Notes (Signed)
Pt here for cough and congestion x 2 days with sore throat and fever

## 2015-11-12 LAB — CULTURE, GROUP A STREP (THRC)

## 2016-03-15 ENCOUNTER — Inpatient Hospital Stay (HOSPITAL_COMMUNITY)
Admission: AD | Admit: 2016-03-15 | Discharge: 2016-03-15 | Disposition: A | Discharge status: Home / Self Care | Payer: Medicaid Other | Source: Ambulatory Visit | Attending: Obstetrics & Gynecology | Admitting: Obstetrics & Gynecology

## 2016-03-15 ENCOUNTER — Inpatient Hospital Stay (HOSPITAL_COMMUNITY): Payer: Medicaid Other

## 2016-03-15 ENCOUNTER — Encounter (HOSPITAL_COMMUNITY): Payer: Self-pay | Admitting: *Deleted

## 2016-03-15 DIAGNOSIS — O209 Hemorrhage in early pregnancy, unspecified: Secondary | ICD-10-CM | POA: Diagnosis not present

## 2016-03-15 DIAGNOSIS — B9689 Other specified bacterial agents as the cause of diseases classified elsewhere: Secondary | ICD-10-CM

## 2016-03-15 DIAGNOSIS — F17210 Nicotine dependence, cigarettes, uncomplicated: Secondary | ICD-10-CM | POA: Diagnosis not present

## 2016-03-15 DIAGNOSIS — O23591 Infection of other part of genital tract in pregnancy, first trimester: Secondary | ICD-10-CM | POA: Diagnosis not present

## 2016-03-15 DIAGNOSIS — O99331 Smoking (tobacco) complicating pregnancy, first trimester: Secondary | ICD-10-CM | POA: Insufficient documentation

## 2016-03-15 DIAGNOSIS — R109 Unspecified abdominal pain: Secondary | ICD-10-CM | POA: Diagnosis present

## 2016-03-15 DIAGNOSIS — Z3A01 Less than 8 weeks gestation of pregnancy: Secondary | ICD-10-CM | POA: Diagnosis not present

## 2016-03-15 DIAGNOSIS — N760 Acute vaginitis: Secondary | ICD-10-CM

## 2016-03-15 LAB — WET PREP, GENITAL
Sperm: NONE SEEN
Trich, Wet Prep: NONE SEEN
YEAST WET PREP: NONE SEEN

## 2016-03-15 LAB — URINALYSIS, ROUTINE W REFLEX MICROSCOPIC
Bilirubin Urine: NEGATIVE
GLUCOSE, UA: NEGATIVE mg/dL
Ketones, ur: NEGATIVE mg/dL
Leukocytes, UA: NEGATIVE
Nitrite: NEGATIVE
Protein, ur: NEGATIVE mg/dL
Specific Gravity, Urine: 1.02 (ref 1.005–1.030)
pH: 6 (ref 5.0–8.0)

## 2016-03-15 LAB — POCT PREGNANCY, URINE: PREG TEST UR: POSITIVE — AB

## 2016-03-15 LAB — URINALYSIS, MICROSCOPIC (REFLEX)

## 2016-03-15 LAB — HCG, QUANTITATIVE, PREGNANCY: hCG, Beta Chain, Quant, S: 709 m[IU]/mL — ABNORMAL HIGH (ref <5)

## 2016-03-15 MED ORDER — METRONIDAZOLE 500 MG PO TABS: 500.0000 mg | ORAL_TABLET | Freq: Two times a day (BID) | ORAL | 0 refills | Status: DC

## 2016-03-15 NOTE — MAU Note (Signed)
Pt C/O spotting since yesterday, was bright red, now pink/brown.  Started cramping last night.  Pos HPT 2 weeks ago.

## 2016-03-15 NOTE — MAU Provider Note (Signed)
[facility] MAU Attending Note  History     CSN: [id number]  Arrival date & time 03/15/16  1659   First Provider Initiated Contact with Patient 03/15/16 2012      Chief Complaint  Patient presents with  . Vaginal Bleeding  . Abdominal Pain    Carmen Johnston is a 36 y.o. [id number] at [redacted]w[redacted]d who presents to MAU today for evaluation of abdominal cramping and spotting since yesterday. Had positive UPT at home 2 weeks ago. Denies any abnormal vaginal discharge, fevers, chills, sweats, dysuria, nausea, vomiting, other GI or GU symptoms or other general symptoms.   Obstetric History   G8   P4   T4   P0   A3   L4    SAB1   TAB1   Ectopic1   Multiple0   Live Births4     # Outcome Date GA Lbr Len/2nd Weight Sex Delivery Anes PTL Lv  8 Current           7 Term 07/20/11 [redacted]w[redacted]d 15:36 / 00:12 6 lb 5.8 oz (2.886 kg) M Vag-Spont EPI  LIV     Name: JODYE, SCALI     Apgar1:  9                Apgar5: 9  6 Ectopic 2010          5 Term 10/28/05    F Vag-Spont  N DEC  4 Term 07/04/04    M Vag-Spont  N LIV  3 Term 02/19/98    M Vag-Spont   LIV  2 SAB           1 TAB               Past Medical History:  Diagnosis Date  . Abnormal Pap smear   . Anemia   . Chlamydia   . Kidney stone   . Pregnancy induced hypertension   . Trichomonas   . Urinary tract infection     Past Surgical History:  Procedure Laterality Date  . COLPOSCOPY    . INDUCED ABORTION      Family History  Problem Relation Age of Onset  . Mental illness Mother   . Anesthesia problems Neg Hx     Social History  Substance Use Topics  . Smoking status: Current Every Day Smoker    Packs/day: 0.25    Years: 0.00    Types: Cigarettes  . Smokeless tobacco: Never Used  . Alcohol use No    No Known Allergies  Prescriptions Prior to Admission  Medication Sig Dispense Refill Last Dose  . ibuprofen (ADVIL,MOTRIN) 200 MG tablet Take 200 mg by mouth every 6 (six) hours as needed for moderate pain.   03/14/2016  at Unknown time  . azithromycin (ZITHROMAX) 250 MG tablet Take 1 tablet (250 mg total) by mouth daily. Take first 2 tablets together, then 1 every day until finished. (Patient not taking: Reported on 03/15/2016) 6 tablet 0 Not Taking at Unknown time  . gabapentin (NEURONTIN) 100 MG capsule Take 3 capsules (300 mg total) by mouth 3 (three) times daily. (Patient not taking: Reported on 03/15/2016) 60 capsule 0 Not Taking at Unknown time  . predniSONE (DELTASONE) 10 MG tablet Take 2 tablets (20 mg total) by mouth 2 (two) times daily with a meal. (Patient not taking: Reported on 03/15/2016) 16 tablet 0 Not Taking at Unknown time     Physical Exam  BP 130/89   Pulse 73  Temp 98.6 F (37 C) (Oral)   Resp 18   Ht 5\' 7"  (1.702 m)   Wt 218 lb (98.9 kg)   LMP 02/01/2016 (Approximate)   BMI 34.14 kg/m  GENERAL: Well-developed, well-nourished female in no acute distress  SKIN: Warm, dry and without erythema PSYCH: Normal mood and affect HEENT: Normocephalic, atraumatic.   LUNGS: Normal respiratory effort, normal breath sounds HEART: Regular rate noted ABDOMEN: Soft, nondistended, nontender  PELVIC: NEFG. Scant bloody discharge noted in vault, samples obtained for testing. Closed and long cervix.  EXTREMITIES: No edema, no cyanosis, normal range of movement  MAU Course/MDM  2020  Pelvic cultures obtained and sent. Ultrasound ordered.  2120 Received call from Radiologist, he is concerned about possible molar gestation. Discussed this with patient, will check stat HCG.  Labs and Imaging   Results for orders placed or performed during the hospital encounter of 03/15/16 (from the past 24 hour(s))  Urinalysis, Routine w reflex microscopic     Status: Abnormal   Collection Time: 03/15/16  5:41 PM  Result Value Ref Range   Color, Urine YELLOW YELLOW   APPearance CLEAR CLEAR   Specific Gravity, Urine 1.020 1.005 - 1.030   pH 6.0 5.0 - 8.0   Glucose, UA NEGATIVE NEGATIVE mg/dL   Hgb urine dipstick  LARGE (A) NEGATIVE   Bilirubin Urine NEGATIVE NEGATIVE   Ketones, ur NEGATIVE NEGATIVE mg/dL   Protein, ur NEGATIVE NEGATIVE mg/dL   Nitrite NEGATIVE NEGATIVE   Leukocytes, UA NEGATIVE NEGATIVE  Urinalysis, Microscopic (reflex)     Status: Abnormal   Collection Time: 03/15/16  5:41 PM  Result Value Ref Range   RBC / HPF 6-30 0 - 5 RBC/hpf   WBC, UA 0-5 0 - 5 WBC/hpf   Bacteria, UA FEW (A) NONE SEEN   Squamous Epithelial / LPF 0-5 (A) NONE SEEN  Pregnancy, urine POC     Status: Abnormal   Collection Time: 03/15/16  6:46 PM  Result Value Ref Range   Preg Test, Ur POSITIVE (A) NEGATIVE  Wet prep, genital     Status: Abnormal   Collection Time: 03/15/16  8:18 PM  Result Value Ref Range   Yeast Wet Prep HPF POC NONE SEEN NONE SEEN   Trich, Wet Prep NONE SEEN NONE SEEN   Clue Cells Wet Prep HPF POC PRESENT (A) NONE SEEN   WBC, Wet Prep HPF POC MODERATE (A) NONE SEEN   Sperm NONE SEEN   hCG, quantitative, pregnancy     Status: Abnormal   Collection Time: 03/15/16  9:16 PM  Result Value Ref Range   hCG, Beta Chain, Quant, S 709 (H) <5 mIU/mL   US Ob Comp Less 14 Wks  Result Date: 03/15/2016 CLINICAL DATA:  Spotting for 1 day.  Cramps EXAM: OBSTETRIC <14 WK [place] AND TRANSVAGINAL OB US TECHNIQUE: Both transabdominal and transvaginal ultrasound examinations were performed for complete evaluation of the gestation as well as the maternal uterus, adnexal regions, and pelvic cul-de-sac. Transvaginal technique was performed to assess early pregnancy. COMPARISON:  None. FINDINGS: Intrauterine gestational sac: None visualized Yolk sac:  Not visualized Embryo:  Not visualized Cardiac Activity: Heart Rate:   bpm MSD:   mm    w     d CRL:    mm    w    d                  [place] EDC: Subchorionic hemorrhage:  None visualized. Maternal uterus/adnexae: Endometrium thickened at 25  mm. Multiple cystic areas noted within the endometrium. IMPRESSION: No normal intrauterine gestational sac noted. Endometrium is  thickened with multiple small cystic areas. Cannot exclude molar pregnancy. These results were called by telephone at the time of interpretation on 03/15/2016 at 8:57 pm to Dr. Jaynie CollinsUGONNA Baylon Santelli , who verbally acknowledged these results. Electronically Signed   By: Charlett NoseKevin  Dover M.D.   On: 03/15/2016 20:58   [place] Ob Transvaginal  Result Date: 03/15/2016 CLINICAL DATA:  Spotting for 1 day.  Cramps EXAM: OBSTETRIC <14 WK US AND TRANSVAGINAL OB US TECHNIQUE: Both transabdominal and transvaginal ultrasound examinations were performed for complete evaluation of the gestation as well as the maternal uterus, adnexal regions, and pelvic cul-de-sac. Transvaginal technique was performed to assess early pregnancy. COMPARISON:  None. FINDINGS: Intrauterine gestational sac: None visualized Yolk sac:  Not visualized Embryo:  Not visualized Cardiac Activity: Heart Rate:   bpm MSD:   mm    w     d CRL:    mm    w    d                  US EDC: Subchorionic hemorrhage:  None visualized. Maternal uterus/adnexae: Endometrium thickened at 25 mm. Multiple cystic areas noted within the endometrium. IMPRESSION: No normal intrauterine gestational sac noted. Endometrium is thickened with multiple small cystic areas. Cannot exclude molar pregnancy. These results were called by telephone at the time of interpretation on 03/15/2016 at 8:57 pm to Dr. Jaynie CollinsUGONNA Bless Belshe , who verbally acknowledged these results. Electronically Signed   By: Charlett NoseKevin  Dover M.D.   On: 03/15/2016 20:58    Assessment and Plan   1. Bleeding in early pregnancy   2. BV (bacterial vaginosis)    HCG is 709. Could be early pregnancy vs ectopic vs molar gestation.  Will recheck in 48 hours and decide about any further intervention at that point.  Metronidazole prescribed for BV Patient was told to return to MAU for any pain, heavy bleeding or other concerns, or if her condition were to change or worsen. Discharged to home in stable condition   Follow-up Information     [facility](s).   Why:  Repeat HCG measurement Contact information: [address] [place] [id number] [phone]         Allergies as of 03/15/2016   No Known Allergies     Medication List    STOP taking these medications   azithromycin 250 MG tablet Commonly known as:  ZITHROMAX   gabapentin 100 MG capsule Commonly known as:  NEURONTIN   ibuprofen 200 MG tablet Commonly known as:  ADVIL,MOTRIN   predniSONE 10 MG tablet Commonly known as:  DELTASONE     TAKE these medications   metroNIDAZOLE 500 MG tablet Commonly known as:  FLAGYL Take 1 tablet (500 mg total) by mouth 2 (two) times daily.        Jaynie CollinsUGONNA  Timoteo Carreiro, MD, FACOG Attending Obstetrician & Gynecologist, [facility], [facility]

## 2016-03-15 NOTE — Discharge Instructions (Signed)
Vaginal Bleeding During Pregnancy, First Trimester A small amount of bleeding (spotting) from the vagina is common in early pregnancy. Sometimes the bleeding is normal and is not a problem, and sometimes it is a sign of something serious. Be sure to tell your doctor about any bleeding from your vagina right away. Follow these instructions at home:  Watch your condition for any changes.  Follow your doctor's instructions about how active you can be.  If you are on bed rest:  You may need to stay in bed and only get up to use the bathroom.  You may be allowed to do some activities.  If you need help, make plans for someone to help you.  Write down:  The number of pads you use each day.  How often you change pads.  How soaked (saturated) your pads are.  Do not use tampons.  Do not douche.  Do not have sex or orgasms until your doctor says it is okay.  If you pass any tissue from your vagina, save the tissue so you can show it to your doctor.  Only take medicines as told by your doctor.  Do not take aspirin because it can make you bleed.  Keep all follow-up visits as told by your doctor. Contact a doctor if:  You bleed from your vagina.  You have cramps.  You have labor pains.  You have a fever that does not go away after you take medicine. Get help right away if:  You have very bad cramps in your back or belly (abdomen).  You pass large clots or tissue from your vagina.  You bleed more.  You feel light-headed or weak.  You pass out (faint).  You have chills.  You are leaking fluid or have a gush of fluid from your vagina.  You pass out while pooping (having a bowel movement). This information is not intended to replace advice given to you by your health care provider. Make sure you discuss any questions you have with your health care provider. Document Released: 05/05/2013 Document Revised: 05/27/2015 Document Reviewed: 08/26/2012 Elsevier Interactive  Patient Education  2017 Elsevier Inc.   Molar Pregnancy A molar pregnancy (hydatidiform mole) is a mass of tissue that grows in the uterus after conception. The mass is created by an egg that was not fertilized correctly and abnormally grows. It is an abnormal pregnancy and does not develop into a fetus. If a molar pregnancy is suspected by your health care provider, treatment is required. What are the causes? Molar pregnancy is caused by an egg that is fertilized incorrectly so that it has abnormal genetic material (chromosomes). This can result in one of 2 types of molar pregnancy:  Complete molar pregnancy--All of the chromosomes in the fertilized egg come from the father; none come from the mother.  Partial molar pregnancy--The fertilized egg has chromosomes from the father and mother, but it has too many chromosomes. What increases the risk? Certain risk factors make a molar pregnancy more likely. They include:  Being over age 63 or under age 13.  History of a molar pregnancy in the past (extremely small chance of recurrence). Other possible risk factors include:  Smoking more than 15 cigarettes per day.  History of infertility.  Having a certain blood type (A, B, AB).  Having a vitamin A deficiency.  Using oral contraceptives. What are the signs or symptoms?  Vaginal bleeding.  Missed menstrual period.  Uterus grows quicker than normal.  Severe nausea and  vomiting.  Severe pressure or pain in the uterus.  Abnormal ovarian cysts (theca lutein cysts).  Discharge from the vagina that looks like grapes.  High blood pressure (early onset of preeclampsia).  Overactive thyroid (hyperthyroidism).  Anemia. How is this diagnosed? If your health care provider thinks there is a chance of a molar pregnancy, testing will be recommended. Possible tests include:  An ultrasound test.  Blood tests. How is this treated? Most molar pregnancies end on their own by  miscarriage. However, a health care provider needs to make sure that all the abnormal tissue is out of the womb. This can be done with dilation and curettage (D&C) or suction curettage. In this procedure, any remaining molar tissue is removed through the vagina. After diagnosis of a molar pregnancy, the pregnancy hormone levels must be followed until the level is zero. If the pregnancy hormone level does not drop appropriately, chemotherapy may be necessary. Also, you will be given a medicine called Rho (D) immune globulin if you are Rh negative and your sex partner is Rh positive. This helps prevent Rh problems in future pregnancies. Follow these instructions at home:  Avoid getting pregnant for 6-12 months or as directed by your health care provider. Use a reliable form of birth control or do not have sex.  Only take over-the-counter or prescription medicine as directed by your health care provider.  Keep all follow-up appointments and get all suggested lab tests and ultrasound tests.  Gradually return to normal activities.  Think about joining a support group. Ask for help if you are struggling with grief. This information is not intended to replace advice given to you by your health care provider. Make sure you discuss any questions you have with your health care provider. Document Released: 09/06/2010 Document Revised: 05/27/2015 Document Reviewed: 07/18/2012 Elsevier Interactive Patient Education  2017 [employer]

## 2016-03-16 LAB — GC/CHLAMYDIA PROBE AMP (~~LOC~~) NOT AT ARMC
Chlamydia: NEGATIVE
Neisseria Gonorrhea: NEGATIVE

## 2016-03-17 ENCOUNTER — Telehealth: Payer: Self-pay

## 2016-03-17 ENCOUNTER — Other Ambulatory Visit: Payer: Medicaid Other

## 2016-03-17 NOTE — Telephone Encounter (Signed)
Patient miss appointment today for stat bhcg. Called patient she stated she forgot and will come on Monday for her appointment. I advised patient to come early because she will need to wait for her results. Patient voice understanding.

## 2016-11-10 ENCOUNTER — Encounter (HOSPITAL_COMMUNITY): Payer: Self-pay | Admitting: Emergency Medicine

## 2016-11-10 ENCOUNTER — Other Ambulatory Visit: Payer: Self-pay

## 2016-11-10 ENCOUNTER — Ambulatory Visit (HOSPITAL_COMMUNITY)
Admission: EM | Admit: 2016-11-10 | Discharge: 2016-11-10 | Disposition: A | Discharge status: Home / Self Care | Payer: Medicaid Other | Attending: Internal Medicine | Admitting: Internal Medicine

## 2016-11-10 DIAGNOSIS — K047 Periapical abscess without sinus: Secondary | ICD-10-CM | POA: Diagnosis not present

## 2016-11-10 MED ORDER — PENICILLIN V POTASSIUM 500 MG PO TABS: 500.0000 mg | ORAL_TABLET | Freq: Four times a day (QID) | ORAL | 0 refills | Status: DC

## 2016-11-10 MED ORDER — NAPROXEN 500 MG PO TABS: 500.0000 mg | ORAL_TABLET | Freq: Two times a day (BID) | ORAL | 0 refills | Status: AC

## 2016-11-10 MED ORDER — TRAMADOL HCL 50 MG PO TABS: 50.0000 mg | ORAL_TABLET | Freq: Four times a day (QID) | ORAL | 0 refills | Status: DC | PRN

## 2016-11-10 MED ORDER — BENZOCAINE 10 % MT GEL: 1.0000 "application " | OROMUCOSAL | 0 refills | Status: DC | PRN

## 2016-11-10 NOTE — Discharge Instructions (Signed)
Start Penicillin as directed for dental abscess. Stop ibuprofen. Naproxen as directed and oragel for pain. Tramadol as needed for breakthrough pain. Please do not double up on medications. Follow up with dentist for further treatment and evaluation. If experiencing swelling of the throat, trouble breathing, trouble swallowing, follow up for reevaluation.

## 2016-11-10 NOTE — ED Provider Notes (Signed)
[facility]    CSN: [id number] Arrival date & time: 11/10/16  [id number]     History   Chief Complaint Chief Complaint  Patient presents with  . Dental Pain    HPI Carmen Johnston is a 36 y.o. female.   36 year old female comes in for 1 day history of left lower dental pain. Patient states she has known cracked tooth at that location, missed dental appointment due to work and have not rescheduled appointment. She denies fever, chills, night sweats. Has some lower jaw facial swelling. Denies trouble breathing, trouble swallowing, swelling of the throat. Patient took ibuprofen 1600mg  this morning for the pain with improvement.       Past Medical History:  Diagnosis Date  . Abnormal Pap smear   . Anemia   . Chlamydia   . Kidney stone   . Pregnancy induced hypertension   . Trichomonas   . Urinary tract infection     There are no active problems to display for this patient.   Past Surgical History:  Procedure Laterality Date  . COLPOSCOPY    . INDUCED ABORTION      OB History    Gravida Para Term Preterm AB Living   8 4 4  0 3 4   SAB TAB Ectopic Multiple Live Births   1 1 1   4        Home Medications    Prior to Admission medications   Medication Sig Start Date End Date Taking? Authorizing Provider  benzocaine (ORAJEL) 10 % mucosal gel Use as directed 1 application as needed in the mouth or throat for mouth pain. 11/10/16   Cathie HoopsYu, Zelpha Messing V, PA-C  naproxen (NAPROSYN) 500 MG tablet Take 1 tablet (500 mg total) 2 (two) times daily for 10 days by mouth. 11/10/16 11/20/16  Cathie HoopsYu, Analeigh Aries V, PA-C  penicillin v potassium (VEETID) 500 MG tablet Take 1 tablet (500 mg total) 4 (four) times daily for 7 days by mouth. 11/10/16 11/17/16  Belinda FisherYu, Toney Lizaola V, PA-C  traMADol (ULTRAM) 50 MG tablet Take 1 tablet (50 mg total) every 6 (six) hours as needed by mouth. 11/10/16   Belinda FisherYu, Felice Deem V, PA-C    Family History Family History  Problem Relation Age of Onset  . Mental illness Mother   . Anesthesia  problems Neg Hx     Social History Social History   Tobacco Use  . Smoking status: Current Every Day Smoker    Packs/day: 0.25    Years: 0.00    Pack years: 0.00    Types: Cigarettes  . Smokeless tobacco: Never Used  Substance Use Topics  . Alcohol use: No  . Drug use: No     Allergies   Patient has no known allergies.   Review of Systems Review of Systems  Reason unable to perform ROS: See HPI as above.     Physical Exam Triage Vital Signs ED Triage Vitals  Enc Vitals Group     BP 11/10/16 1008 (!) 151/98     Pulse Rate 11/10/16 1008 74     Resp 11/10/16 1008 18     Temp 11/10/16 1008 98.4 F (36.9 C)     Temp src --      SpO2 11/10/16 1008 100 %     Weight --      Height --      Head Circumference --      Peak Flow --      Pain Score 11/10/16 1009 10  Pain Loc --      Pain Edu? --      Excl. in GC? --    No data found.  Updated Vital Signs BP (!) 151/98   Pulse 74   Temp 98.4 F (36.9 C)   Resp 18   LMP 10/14/2016   SpO2 100%   Breastfeeding? Unknown   Physical Exam  Constitutional: She is oriented to person, place, and time. She appears well-developed and well-nourished. No distress.  HENT:  Head: Normocephalic and atraumatic.  Mouth/Throat: Uvula is midline, oropharynx is clear and moist and mucous membranes are normal. Dental abscesses present.    Cracked tooth with swelling around the gums with dental abscess.   Floor of mouth soft. Mild facial swelling at left lower jaw.   Eyes: Conjunctivae are normal. Pupils are equal, round, and reactive to light.  Neurological: She is alert and oriented to person, place, and time.     UC Treatments / Results  Labs (all labs ordered are listed, but only abnormal results are displayed) Labs Reviewed - No data to display  EKG  EKG Interpretation None       Radiology No results found.  Procedures Procedures (including critical care time)  Medications Ordered in UC Medications -  No data to display   Initial Impression / Assessment and Plan / UC Course  I have reviewed the triage vital signs and the nursing notes.  Pertinent labs & imaging results that were available during my care of the patient were reviewed by me and considered in my medical decision making (see chart for details).    Start antibiotics for dental abscess. Symptomatic treatment as needed. Discussed appropriate NSAID use/dosage, discussed risks of overusing NSAIDs. Tramadol for break through pain. Discussed with patient symptoms can return if dental problem is not addressed. Follow up with dentist for further evaluation and treatment of dental pain. Return precautions given.   Final Clinical Impressions(s) / UC Diagnoses   Final diagnoses:  Dental abscess    ED Discharge Orders        Ordered    penicillin v potassium (VEETID) 500 MG tablet  4 times daily     11/10/16 1030    naproxen (NAPROSYN) 500 MG tablet  2 times daily     11/10/16 1030    benzocaine (ORAJEL) 10 % mucosal gel  As needed     11/10/16 1030    traMADol (ULTRAM) 50 MG tablet  Every 6 hours PRN     11/10/16 1030       Controlled Substance Prescriptions [place] Controlled Substance Registry consulted? Yes, I have consulted the [place] Controlled Substances Registry for this patient, and feel the risk/benefit ratio today is favorable for proceeding with this prescription for a controlled substance.   Belinda FisherYu, Shahara Hartsfield V, PA-C 11/10/16 1037

## 2016-11-10 NOTE — ED Notes (Signed)
Pt c/o L Lower tooth pain since this morning, took ibuprofen with some relief.

## 2016-11-13 ENCOUNTER — Ambulatory Visit (HOSPITAL_COMMUNITY)
Admission: EM | Admit: 2016-11-13 | Discharge: 2016-11-13 | Disposition: A | Discharge status: Home / Self Care | Payer: Medicaid Other | Attending: Emergency Medicine | Admitting: Emergency Medicine

## 2016-11-13 ENCOUNTER — Other Ambulatory Visit: Payer: Self-pay

## 2016-11-13 ENCOUNTER — Encounter (HOSPITAL_COMMUNITY): Payer: Self-pay | Admitting: Emergency Medicine

## 2016-11-13 DIAGNOSIS — K047 Periapical abscess without sinus: Secondary | ICD-10-CM

## 2016-11-13 MED ORDER — AMOXICILLIN-POT CLAVULANATE 875-125 MG PO TABS: 1.0000 | ORAL_TABLET | Freq: Two times a day (BID) | ORAL | 0 refills | Status: DC

## 2016-11-13 MED ORDER — BUPIVACAINE-EPINEPHRINE (PF) 0.5% -1:200000 IJ SOLN
INTRAMUSCULAR | Status: AC
  Filled 2016-11-13: qty 1.8

## 2016-11-13 NOTE — ED Triage Notes (Signed)
Seen 11/9 for the same, no improvement in dental pain.  Symptoms are no worse, but not getting any better.

## 2016-11-13 NOTE — ED Provider Notes (Signed)
[facility]    CSN: [id number] Arrival date & time: 11/13/16  1654     History   Chief Complaint Chief Complaint  Patient presents with  . Dental Pain    HPI Carmen Johnston is a 36 y.o. female.   36 year old female comes back in for continued dental pain after being seen 3 days ago. She has been taking penicillin and tramadol without relief. She has not been taking naproxen, stating it does not help. States now has trouble opening her mouth due to pain. She denies fever, chills, night sweats. Denies swelling of the throat, trouble breathing, trouble swallowing. She states she was unable to get dentist appointment.       Past Medical History:  Diagnosis Date  . Abnormal Pap smear   . Anemia   . Chlamydia   . Kidney stone   . Pregnancy induced hypertension   . Trichomonas   . Urinary tract infection     There are no active problems to display for this patient.   Past Surgical History:  Procedure Laterality Date  . COLPOSCOPY    . INDUCED ABORTION      OB History    Gravida Para Term Preterm AB Living   8 4 4  0 3 4   SAB TAB Ectopic Multiple Live Births   1 1 1   4        Home Medications    Prior to Admission medications   Medication Sig Start Date End Date Taking? Authorizing Provider  amoxicillin-clavulanate (AUGMENTIN) 875-125 MG tablet Take 1 tablet every 12 (twelve) hours by mouth. 11/13/16   Yu, Amy V, PA-C  benzocaine (ORAJEL) 10 % mucosal gel Use as directed 1 application as needed in the mouth or throat for mouth pain. 11/10/16   Cathie HoopsYu, Amy V, PA-C  naproxen (NAPROSYN) 500 MG tablet Take 1 tablet (500 mg total) 2 (two) times daily for 10 days by mouth. 11/10/16 11/20/16  Belinda FisherYu, Amy V, PA-C  traMADol (ULTRAM) 50 MG tablet Take 1 tablet (50 mg total) every 6 (six) hours as needed by mouth. 11/10/16   Belinda FisherYu, Amy V, PA-C    Family History Family History  Problem Relation Age of Onset  . Mental illness Mother   . Anesthesia problems Neg Hx      Social History Social History   Tobacco Use  . Smoking status: Current Every Day Smoker    Packs/day: 0.25    Years: 0.00    Pack years: 0.00    Types: Cigarettes  . Smokeless tobacco: Never Used  Substance Use Topics  . Alcohol use: No  . Drug use: No     Allergies   Patient has no known allergies.   Review of Systems Review of Systems  Reason unable to perform ROS: See HPI as above.     Physical Exam Triage Vital Signs ED Triage Vitals  Enc Vitals Group     BP 11/13/16 1720 (!) 139/94     Pulse Rate 11/13/16 1720 96     Resp 11/13/16 1720 (!) 24     Temp 11/13/16 1720 98.7 F (37.1 C)     Temp Source 11/13/16 1720 Oral     SpO2 11/13/16 1720 100 %     Weight --      Height --      Head Circumference --      Peak Flow --      Pain Score 11/13/16 1718 10  Pain Loc --      Pain Edu? --      Excl. in GC? --    No data found.  Updated Vital Signs BP (!) 139/94 (BP Location: Right Arm)   Pulse 96   Temp 98.7 F (37.1 C) (Oral)   Resp (!) 24   LMP 10/14/2016   SpO2 100%   Physical Exam  Constitutional: She is oriented to person, place, and time. She appears well-developed and well-nourished. No distress.  HENT:  Head: Normocephalic and atraumatic.  Mouth/Throat:    Swelling around gum area that is unchanged from visit on 11/10/2016.   Floor to mouth soft to palpation. Left lower jaw facial swelling noted that is unchanged from last visit.   Patient unwilling to open jaw wider due to pain.   Eyes: Conjunctivae are normal. Pupils are equal, round, and reactive to light.  Neck: Normal range of motion. Neck supple. No neck rigidity.  Neurological: She is alert and oriented to person, place, and time.     UC Treatments / Results  Labs (all labs ordered are listed, but only abnormal results are displayed) Labs Reviewed - No data to display  EKG  EKG Interpretation None       Radiology No results found.  Procedures Procedures  (including critical care time)  Medications Ordered in UC Medications - No data to display   Initial Impression / Assessment and Plan / UC Course  I have reviewed the triage vital signs and the nursing notes.  Pertinent labs & imaging results that were available during my care of the patient were reviewed by me and considered in my medical decision making (see chart for details).    Discussed case with Dr Chaney MallingMortenson, who also examined patient. Dr Chaney MallingMortenson offered dental block, which patient has agreed to. Will switch penicillin to Augmentin. Dental resources provided. Return precautions given.   Final Clinical Impressions(s) / UC Diagnoses   Final diagnoses:  Dental abscess    ED Discharge Orders        Ordered    amoxicillin-clavulanate (AUGMENTIN) 875-125 MG tablet  Every 12 hours     11/13/16 1835         Belinda FisherYu, Amy V, PA-C 11/13/16 1914

## 2016-11-13 NOTE — ED Provider Notes (Signed)
Evaluated patient with Linward HeadlandAmy Yu, PA.  Patient has gingival swelling, tenderness dental caries left lower tooth.  She does have some trismus.  She has tenderness along the jaw but no swelling inferior to the jaw.  No swelling underneath the tongue.  No cervical lymphadenopathy.  Procedure note: Performed  dental block with local infiltration using bupivacaine 0.5%.  Trismus improved after this.  She tolerated procedure well.  No evidence of Ludwig's angina or deep space infection at this time.  Switching from penicillin to Augmentin, continue pain medicines as needed, advised Listerine and salt water rinses, giving patient dental referral resources.  Gave her strict ER return precautions.  She agrees with plan.   Domenick GongMortenson, Javon Snee, MD 11/13/16 2131

## 2016-11-13 NOTE — Discharge Instructions (Signed)
Stop penicillin, and switch to augmentin as directed.  Follow up with dentist for further treatment and evaluation. If experiencing swelling of the throat, trouble breathing, trouble swallowing, fever > 100.4, trouble moving neck, go to the emergency department for further evaluation.

## 2016-11-15 ENCOUNTER — Other Ambulatory Visit: Payer: Self-pay

## 2016-11-15 ENCOUNTER — Emergency Department (HOSPITAL_COMMUNITY)
Admission: EM | Admit: 2016-11-15 | Discharge: 2016-11-16 | Disposition: A | Discharge status: Home / Self Care | Payer: Medicaid Other | Attending: Emergency Medicine | Admitting: Emergency Medicine

## 2016-11-15 ENCOUNTER — Encounter (HOSPITAL_COMMUNITY): Payer: Self-pay | Admitting: *Deleted

## 2016-11-15 ENCOUNTER — Emergency Department (HOSPITAL_COMMUNITY): Payer: Medicaid Other

## 2016-11-15 DIAGNOSIS — F17210 Nicotine dependence, cigarettes, uncomplicated: Secondary | ICD-10-CM | POA: Insufficient documentation

## 2016-11-15 DIAGNOSIS — K047 Periapical abscess without sinus: Secondary | ICD-10-CM | POA: Diagnosis not present

## 2016-11-15 DIAGNOSIS — K0889 Other specified disorders of teeth and supporting structures: Secondary | ICD-10-CM

## 2016-11-15 LAB — POC URINE PREG, ED: Preg Test, Ur: NEGATIVE

## 2016-11-15 LAB — CBC WITH DIFFERENTIAL/PLATELET
BASOS ABS: 0.1 10*3/uL (ref 0.0–0.1)
Basophils Relative: 1 %
EOS ABS: 0.4 10*3/uL (ref 0.0–0.7)
EOS PCT: 6 %
HEMATOCRIT: 34.7 % — AB (ref 36.0–46.0)
HEMOGLOBIN: 10.8 g/dL — AB (ref 12.0–15.0)
LYMPHS ABS: 2.2 10*3/uL (ref 0.7–4.0)
LYMPHS PCT: 31 %
MCH: 23.8 pg — AB (ref 26.0–34.0)
MCHC: 31.1 g/dL (ref 30.0–36.0)
MCV: 76.4 fL — AB (ref 78.0–100.0)
MONOS PCT: 9 %
Monocytes Absolute: 0.6 10*3/uL (ref 0.1–1.0)
Neutro Abs: 3.7 10*3/uL (ref 1.7–7.7)
Neutrophils Relative %: 53 %
PLATELETS: 372 10*3/uL (ref 150–400)
RBC: 4.54 MIL/uL (ref 3.87–5.11)
RDW: 16.4 % — ABNORMAL HIGH (ref 11.5–15.5)
WBC: 6.9 10*3/uL (ref 4.0–10.5)

## 2016-11-15 LAB — I-STAT CHEM 8, ED
BUN: 8 mg/dL (ref 6–20)
CALCIUM ION: 1.16 mmol/L (ref 1.15–1.40)
CHLORIDE: 104 mmol/L (ref 101–111)
CREATININE: 0.8 mg/dL (ref 0.44–1.00)
GLUCOSE: 82 mg/dL (ref 65–99)
HCT: 35 % — ABNORMAL LOW (ref 36.0–46.0)
Hemoglobin: 11.9 g/dL — ABNORMAL LOW (ref 12.0–15.0)
POTASSIUM: 3.6 mmol/L (ref 3.5–5.1)
Sodium: 138 mmol/L (ref 135–145)
TCO2: 22 mmol/L (ref 22–32)

## 2016-11-15 MED ORDER — BUPIVACAINE HCL 0.5 % IJ SOLN
50.0000 mL | Freq: Once | INTRAMUSCULAR | Status: AC
  Administered 2016-11-15: 50 mL
  Filled 2016-11-15: qty 50

## 2016-11-15 MED ORDER — HYDROCODONE-ACETAMINOPHEN 5-325 MG PO TABS
2.0000 | ORAL_TABLET | Freq: Once | ORAL | Status: AC
  Administered 2016-11-15: 2 via ORAL
  Filled 2016-11-15: qty 2

## 2016-11-15 MED ORDER — IOPAMIDOL (ISOVUE-300) INJECTION 61%
INTRAVENOUS | Status: AC
  Administered 2016-11-15: 75 mL
  Filled 2016-11-15: qty 75

## 2016-11-15 MED ORDER — ACETAMINOPHEN 325 MG PO TABS: 650.0000 mg | ORAL_TABLET | Freq: Once | ORAL | Status: DC

## 2016-11-15 MED ORDER — CLINDAMYCIN PHOSPHATE 600 MG/50ML IV SOLN
600.0000 mg | Freq: Once | INTRAVENOUS | Status: AC
  Administered 2016-11-15: 600 mg via INTRAVENOUS
  Filled 2016-11-15: qty 50

## 2016-11-15 MED ORDER — KETOROLAC TROMETHAMINE 30 MG/ML IJ SOLN
30.0000 mg | Freq: Once | INTRAMUSCULAR | Status: AC
  Administered 2016-11-15: 30 mg via INTRAVENOUS
  Filled 2016-11-15: qty 1

## 2016-11-15 NOTE — ED Triage Notes (Signed)
PT states has been to UC  For L lower dental pain and has been given 2 different antibiotics with no improvement.  States unable to eat anything for 5 days.

## 2016-11-15 NOTE — ED Provider Notes (Signed)
[facility] EMERGENCY DEPARTMENT Provider Note   CSN: [id number] Arrival date & time: 11/15/16  1549     History   Chief Complaint Chief Complaint  Patient presents with  . Dental Pain    HPI ANDERA CRANMER is a 36 y.o. female who presents with 5 days of left lower dental pain.  Patient reports that symptoms initially began 5 days ago and progressively worsened.  She reports onset of symptoms, patient went to urgent care and was evaluated.  At that time she was started on penicillin and discharged home.  Patient returned to urgent care 2 days later with no improvement in symptoms.  At that time, patient did have some trismus noted but no neck or facial swelling.  Patient's antibiotics were switched from penicillin to Augmentin.  Patient was given dental resources but has not followed up.  Today because of continued and persistent pain.  Patient reports she is still having slight difficulty opening her mouth.  He pain.  She reports that she is able to tolerate her secretions and tolerate liquids.  Patient reports difficulty eating solid food secondary to pain with opening and closing mouth.  She states that she been taking intermittent doses of Tylenol for pain relief with minimal improvements.  Patient denies any fevers, difficulty swallowing, difficulty breathing, neck swelling, redness or swelling of the face, fevers.  The history is provided by the patient.    Past Medical History:  Diagnosis Date  . Abnormal Pap smear   . Anemia   . Chlamydia   . Kidney stone   . Pregnancy induced hypertension   . Trichomonas   . Urinary tract infection     There are no active problems to display for this patient.   Past Surgical History:  Procedure Laterality Date  . COLPOSCOPY    . INDUCED ABORTION      OB History    Gravida Para Term Preterm AB Living   8 4 4  0 3 4   SAB TAB Ectopic Multiple Live Births   1 1 1   4        Home Medications    Prior to  Admission medications   Medication Sig Start Date End Date Taking? Authorizing Provider  amoxicillin-clavulanate (AUGMENTIN) 875-125 MG tablet Take 1 tablet every 12 (twelve) hours by mouth. 11/13/16   Yu, Amy V, PA-C  benzocaine (ORAJEL) 10 % mucosal gel Use as directed 1 application as needed in the mouth or throat for mouth pain. 11/10/16   Belinda Fisher, PA-C  clindamycin (CLEOCIN) 150 MG capsule Take 3 capsules (450 mg total) 3 (three) times daily for 7 days by mouth. 11/16/16 11/23/16  Maxwell Caul, PA-C  HYDROcodone-acetaminophen (NORCO/VICODIN) 5-325 MG tablet Take 1-2 tablets every 6 (six) hours as needed by mouth. 11/16/16   Maxwell Caul, PA-C  naproxen (NAPROSYN) 500 MG tablet Take 1 tablet (500 mg total) 2 (two) times daily for 10 days by mouth. 11/10/16 11/20/16  Belinda Fisher, PA-C  traMADol (ULTRAM) 50 MG tablet Take 1 tablet (50 mg total) every 6 (six) hours as needed by mouth. 11/10/16   Belinda Fisher, PA-C    Family History Family History  Problem Relation Age of Onset  . Mental illness Mother   . Anesthesia problems Neg Hx     Social History Social History   Tobacco Use  . Smoking status: Current Every Day Smoker    Packs/day: 0.25    Years: 0.00  Pack years: 0.00    Types: Cigarettes  . Smokeless tobacco: Never Used  Substance Use Topics  . Alcohol use: Yes    Comment: occ  . Drug use: No     Allergies   Patient has no known allergies.   Review of Systems Review of Systems  Constitutional: Negative for fever.  HENT: Positive for dental problem. Negative for drooling, sore throat and trouble swallowing.   Respiratory: Negative for shortness of breath.      Physical Exam Updated Vital Signs BP (!) 148/122 (BP Location: Left Arm)   Pulse 94   Temp 99 F (37.2 C) (Oral)   Resp 14   Ht 5\' 7"  (1.702 m)   Wt 97.5 kg (215 lb)   LMP 11/15/2016   SpO2 100%   BMI 33.67 kg/m   Physical Exam  Constitutional: She is oriented to person, place, and time.  She appears well-developed and well-nourished.  Appears uncomfortable but no acute distress   HENT:  Head: Normocephalic and atraumatic.    Mouth/Throat: Oropharynx is clear and moist and mucous membranes are normal. There is trismus in the jaw. Dental abscesses and dental caries present.  Patient does have some difficulty opening and closing the mouth.  Visualization of posterior oropharynx is limited secondary to patient's inability to completely open mouth.  Limited visualization shows that uvula appears midline.  No evidence of deviation.  No evidence of peritonsillar abscess.  Patient does have some gingival swelling noted to tooth #19.  There does appear to be a small amount of fluctuance surrounding that tooth.  Diffuse dental caries throughout.  Neck swelling noted.  Eyes: Conjunctivae, EOM and lids are normal. Pupils are equal, round, and reactive to light.  Neck: Full passive range of motion without pain.  Cardiovascular: Normal rate, regular rhythm and normal pulses.  Pulmonary/Chest: Effort normal and breath sounds normal.  No evidence of respiratory distress. Able to speak in full sentences without difficulty.  Musculoskeletal: Normal range of motion.  Neurological: She is alert and oriented to person, place, and time.  Skin: Skin is warm and dry. Capillary refill takes less than 2 seconds.  Psychiatric: She has a normal mood and affect. Her speech is normal.  Nursing note and vitals reviewed.    ED Treatments / Results  Labs (all labs ordered are listed, but only abnormal results are displayed) Labs Reviewed  CBC WITH DIFFERENTIAL/PLATELET - Abnormal; Notable for the following components:      Result Value   Hemoglobin 10.8 (*)    HCT 34.7 (*)    MCV 76.4 (*)    MCH 23.8 (*)    RDW 16.4 (*)    All other components within normal limits  I-STAT CHEM 8, ED - Abnormal; Notable for the following components:   Hemoglobin 11.9 (*)    HCT 35.0 (*)    All other components  within normal limits  POC URINE PREG, ED    EKG  EKG Interpretation None       Radiology Ct Maxillofacial W Contrast  Result Date: 11/15/2016 CLINICAL DATA:  Initial evaluation for left-sided jaw mass/ pain for 5 days. EXAM: CT MAXILLOFACIAL WITH CONTRAST TECHNIQUE: Multidetector CT imaging of the maxillofacial structures was performed with intravenous contrast. Multiplanar CT image reconstructions were also generated. CONTRAST:  75mL ISOVUE-300 IOPAMIDOL (ISOVUE-300) INJECTION 61% COMPARISON:  None available. FINDINGS: Osseous: No acute osseous abnormality identified within the face. Orbits: Globes and orbital soft tissues within normal limits. Sinuses: Mild scattered mucosal thickening  within the maxillary sinuses bilaterally. Paranasal sinuses otherwise clear. Visualize mastoids and middle ear cavities are clear. Soft tissues: Asymmetric soft tissue swelling with inflammatory stranding involving the left masticator and submandibular spaces, concerning for acute cellulitis. Multiple dental caries present common most notable at the third mandibular molars bilaterally, suggesting an odontogenic origin. Subtle 8 mm rim enhancing collection along the left mandibular body consistent with a small subperiosteal abscess (series 7, image 43). No other discrete or drainable fluid collection identified. Prominent left level 1 B nodes measure up to 1 cm, likely reactive. Left retro antral and parapharyngeal fat preserved without significant extension of inflammatory changes into the deeper spaces of the visualize face. Limited intracranial: Unremarkable. IMPRESSION: Asymmetric soft tissue swelling with inflammatory stranding involving the soft tissues of the left masticator and submandibular spaces, consistent with acute cellulitis. Poor dentition with multiple prominent dental caries suggest an odontogenic origin. Superimposed 8 mm subperiosteal abscess positioned along the left mandibular body as above.  Electronically Signed   By: Rise Mu M.D.   On: 11/15/2016 22:48    Procedures Dental Block Date/Time: 11/15/2016 9:00 PM Performed by: Maxwell Caul, PA-C Authorized by: Maxwell Caul, PA-C   Consent:    Consent obtained:  Verbal   Consent given by:  Patient   Risks discussed:  Infection, swelling and unsuccessful block Indications:    Indications: dental abscess and dental pain   Location:    Block type:  Supraperiosteal   Supraperiosteal location:  Lower teeth Procedure details (see MAR for exact dosages):    Needle gauge:  25 G   Anesthetic injected:  Bupivacaine 0.5% w/o epi   Injection procedure:  Anatomic landmarks identified, introduced needle, incremental injection and negative aspiration for blood Post-procedure details:    Outcome:  Pain improved   Patient tolerance of procedure:  Tolerated well, no immediate complications .Marland KitchenIncision and Drainage Date/Time: 11/15/2016 9:00 PM Performed by: Maxwell Caul, PA-C Authorized by: Maxwell Caul, PA-C   Consent:    Consent obtained:  Verbal   Consent given by:  Patient   Risks discussed:  Bleeding, incomplete drainage and pain Location:    Type:  Abscess   Location:  Mouth Anesthesia (see MAR for exact dosages):    Anesthesia method:  Nerve block   Block needle gauge:  25 G   Block anesthetic:  Bupivacaine 0.5% w/o epi   Block injection procedure:  Anatomic landmarks identified, introduced needle, incremental injection and negative aspiration for blood   Block outcome:  Anesthesia achieved Procedure details:    Incision types:  Stab incision   Scalpel blade:  11   Wound management:  Irrigated with saline   Drainage:  Purulent   Drainage amount:  Scant   Wound treatment:  Wound left open Post-procedure details:    Patient tolerance of procedure:  Tolerated well, no immediate complications Comments:     Once the patient was anesthetized, the area was evaluated for fluctuance.  There  was an area of gingival erythema and swelling noted to the posterior aspect of tooth #19 with some overlying fluctuance.  A small incision was made there was some scant purulent drainage.  The area was irrigated and probed for further purulent drainage but I was not able to express any more.  At this time, the procedure was terminated.   (including critical care time)  Medications Ordered in ED Medications  bupivacaine (MARCAINE) 0.5 % (with pres) injection 50 mL (50 mLs Infiltration Given 11/15/16 2014)  HYDROcodone-acetaminophen (NORCO/VICODIN) 5-325 MG per tablet 2 tablet (2 tablets Oral Given 11/15/16 2014)  ketorolac (TORADOL) 30 MG/ML injection 30 mg (30 mg Intravenous Given 11/15/16 2145)  iopamidol (ISOVUE-300) 61 % injection (75 mLs  Contrast Given 11/15/16 2215)  clindamycin (CLEOCIN) IVPB 600 mg (0 mg Intravenous Stopped 11/16/16 0000)  ketorolac (TORADOL) 15 MG/ML injection 15 mg (15 mg Intravenous Given 11/16/16 0055)     Initial Impression / Assessment and Plan / ED Course  I have reviewed the triage vital signs and the nursing notes.  Pertinent labs & imaging results that were available during my care of the patient were reviewed by me and considered in my medical decision making (see chart for details).      36 year old female who presents with 5 days of dental pain.  Seen by urgent care twice and started on penicillin.  With with no improvement in symptoms, she was changed to Augmentin.  He comes in today after completing 2 days of penicillin and 3 days of Augmentin with persistent left lower dental pain.  Patient also reports very minimal left lower facial swelling and difficulty opening mouth.  No fevers. Patient is afebrile, non-toxic appearing, sitting comfortably on examination table. Vital signs reviewed and stable.   Patient able to tolerate her secretions and tolerate liquids.  Patient reports that she has had difficulty eating solid foods and has not eaten anything  solid today secondary to pain with opening closing mouth. Patient is afebrile, non-toxic appearing, sitting comfortably on examination table. Vital signs reviewed and stable.  Physical exam does show some very mild trismus although during exam, patient is able to open up the mouth for me to evaluate.  She is also able to talk without any difficulty.  Initially when I entered the room, patient was talking on the phone without any difficulty.  When when I tell patient to open her mouth to evaluate for trismus, this is when she has some difficulty with elevation/depression of the mandible secondary to her pain.  Concern if this is true trismus or is secondary to patient's pain with movement.  History/physical exam are not concerning for Ludwig angina.  There is concern for dental abscess into the left lower area.  I discussed with patient regarding the risk first benefits of dental block and I&D at this time patient does wish to proceed at this time.  Dental block and I&D performed as documented above.  There was some small amount of purulent drainage present in the I&D.  Patient reports that she had improvement in pain with a dental block but is still having some left-sided facial pain.  Patient still with some difficulty opening and closing mouth. Discussed patient with Dr. Ranae PalmsYelverton.  Given concerns regarding trismus, will plan to obtain basic labs and CT imaging for further evaluation. Analgesics provided in the department.  Labs reviewed.  Chem-8 with no abnormalities.  CBC shows no evidence of leukocytosis.  Patient does have some slight anemia noted on CBC but records reviewed consistent with previous.  Urine pregnancy is negative.  CT reviewed.  It does show some soft tissue swelling with some inflammatory stranding involving the tissues of the left masticator and submandibular spaces consistent with acute cellulitis.  There is concerned that there may be an odontogenic origin and concerned of a small  subperiosteal abscess along the left mandibular body.  Discussed results with patient.  We will plan to give a dose of IV antibiotics here in the department for symptomatic relief. Patient  with no known drug allergies.  Additional pain medications given.  Reevaluation after IV antibiotics completed.  Patient has been able to drink an entire cup of water here in the department without any difficulty.  She has been talking on the phone and with her friend without any difficulty.  She appears to be able to open her mouth slightly more than she did on initial ED presentation.  When I asked her to open her mouth, patient still reports of some pain and still has some difficulty.  Suspect this is most likely secondary to pain not true trismus.  No evidence of respiratory distress.  Vital signs are stable.  O2 sat is greater than 95% on room air.  Patient stable for discharge at this time.  We will plan to switch antibiotics to clindamycin.  Patient given a list of dental resource guide for her to follow-up with.  Encourage follow-up with dentist immediately. Patient had ample opportunity for questions and discussion. All patient's questions were answered with full understanding. Strict return precautions discussed. Patient expresses understanding and agreement to plan.   Final Clinical Impressions(s) / ED Diagnoses   Final diagnoses:  Pain, dental  Dental abscess    ED Discharge Orders        Ordered    clindamycin (CLEOCIN) 150 MG capsule  3 times daily     11/16/16 0036    HYDROcodone-acetaminophen (NORCO/VICODIN) 5-325 MG tablet  Every 6 hours PRN     11/16/16 0036       Maxwell CaulLayden, Shalita Notte A, PA-C 11/17/16 0143    Loren RacerYelverton, David, MD 11/21/16 1500

## 2016-11-16 MED ORDER — CLINDAMYCIN HCL 150 MG PO CAPS: 450.0000 mg | ORAL_CAPSULE | Freq: Three times a day (TID) | ORAL | 0 refills | Status: AC

## 2016-11-16 MED ORDER — HYDROCODONE-ACETAMINOPHEN 5-325 MG PO TABS: 1.0000 | ORAL_TABLET | Freq: Four times a day (QID) | ORAL | 0 refills | Status: DC | PRN

## 2016-11-16 MED ORDER — KETOROLAC TROMETHAMINE 15 MG/ML IJ SOLN
15.0000 mg | Freq: Once | INTRAMUSCULAR | Status: AC
  Administered 2016-11-16: 15 mg via INTRAVENOUS
  Filled 2016-11-16: qty 1

## 2016-11-16 NOTE — Discharge Instructions (Signed)
Take antibiotics as directed. Please take all of your antibiotics until finished.  You can take Tylenol or Ibuprofen as directed for pain. You can alternate Tylenol and Ibuprofen every 4 hours. If you take Tylenol at 1pm, then you can take Ibuprofen at 5pm. Then you can take Tylenol again at 9pm.   The exam and treatment you received today has been provided on an emergency basis only. This is not a substitute for complete medical or dental care. If your problem worsens or new symptoms (problems) appear, and you are unable to arrange prompt follow-up care with your dentist, call or return to this location. If you do not have a dentist, please follow-up with one on the list provided  CALL YOUR DENTIST OR RETURN IMMEDIATELY IF you develop a fever, rash, difficulty breathing or swallowing, neck or facial swelling, or other potentially serious concerns.

## 2016-11-16 NOTE — ED Notes (Signed)
Pt departed in NAD, refused use of wheelchair.  

## 2016-12-13 ENCOUNTER — Ambulatory Visit (HOSPITAL_COMMUNITY)
Admission: EM | Admit: 2016-12-13 | Discharge: 2016-12-13 | Disposition: A | Discharge status: Home / Self Care | Payer: Medicaid Other | Attending: Family Medicine | Admitting: Family Medicine

## 2016-12-13 ENCOUNTER — Encounter (HOSPITAL_COMMUNITY): Payer: Self-pay | Admitting: Emergency Medicine

## 2016-12-13 ENCOUNTER — Other Ambulatory Visit: Payer: Self-pay

## 2016-12-13 DIAGNOSIS — N898 Other specified noninflammatory disorders of vagina: Secondary | ICD-10-CM | POA: Diagnosis present

## 2016-12-13 DIAGNOSIS — F17210 Nicotine dependence, cigarettes, uncomplicated: Secondary | ICD-10-CM | POA: Insufficient documentation

## 2016-12-13 LAB — POCT PREGNANCY, URINE: Preg Test, Ur: NEGATIVE

## 2016-12-13 MED ORDER — METRONIDAZOLE 500 MG PO TABS: 500.0000 mg | ORAL_TABLET | Freq: Two times a day (BID) | ORAL | 0 refills | Status: DC

## 2016-12-13 MED ORDER — FLUCONAZOLE 150 MG PO TABS: 150.0000 mg | ORAL_TABLET | Freq: Every day | ORAL | 0 refills | Status: DC

## 2016-12-13 NOTE — Discharge Instructions (Signed)
You were treated empirically for yeast and BV. Start diflucan and flagyl as directed. Cytology sent, you will be contacted with any positive results that requires further treatment. Refrain from sexual activity for the next 7 days. Monitor for any worsening of symptoms, fever, abdominal pain, nausea, vomiting, to follow up for reevaluation.

## 2016-12-13 NOTE — ED Provider Notes (Signed)
[facility]    CSN: [id number] Arrival date & time: 12/13/16  1213     History   Chief Complaint Chief Complaint  Patient presents with  . Vaginal Discharge    HPI Carmen Johnston is a 36 y.o. female.   36 year old female comes in for vaginal itching and white clumpy discharge. States she finished antibiotics 1 week ago and started having symptoms. Denies urinary frequency, dysuria, hematuria. Denies abdominal pain, nausea, vomiting. Sexually active with 1 partner, no condom use. No other forms of birth control use. LMP 11/15/2016. Recently switched to new soap.       Past Medical History:  Diagnosis Date  . Abnormal Pap smear   . Anemia   . Chlamydia   . Kidney stone   . Pregnancy induced hypertension   . Trichomonas   . Urinary tract infection     There are no active problems to display for this patient.   Past Surgical History:  Procedure Laterality Date  . COLPOSCOPY    . INDUCED ABORTION      OB History    Gravida Para Term Preterm AB Living   8 4 4  0 3 4   SAB TAB Ectopic Multiple Live Births   1 1 1   4        Home Medications    Prior to Admission medications   Medication Sig Start Date End Date Taking? Authorizing Provider  benzocaine (ORAJEL) 10 % mucosal gel Use as directed 1 application as needed in the mouth or throat for mouth pain. 11/10/16   Cathie HoopsYu, Amy V, PA-C  fluconazole (DIFLUCAN) 150 MG tablet Take 1 tablet (150 mg total) by mouth daily. Take second dose 72 hours later if symptoms still persists. 12/13/16   Cathie HoopsYu, Amy V, PA-C  metroNIDAZOLE (FLAGYL) 500 MG tablet Take 1 tablet (500 mg total) by mouth 2 (two) times daily. 12/13/16   Belinda FisherYu, Amy V, PA-C    Family History Family History  Problem Relation Age of Onset  . Mental illness Mother   . Anesthesia problems Neg Hx     Social History Social History   Tobacco Use  . Smoking status: Current Every Day Smoker    Packs/day: 0.25    Years: 0.00    Pack years: 0.00    Types:  Cigarettes  . Smokeless tobacco: Never Used  Substance Use Topics  . Alcohol use: Yes    Comment: occ  . Drug use: No     Allergies   Patient has no known allergies.   Review of Systems Review of Systems  Reason unable to perform ROS: See HPI as above.     Physical Exam Triage Vital Signs ED Triage Vitals  Enc Vitals Group     BP 12/13/16 1239 (!) 114/96     Pulse Rate 12/13/16 1239 78     Resp 12/13/16 1239 18     Temp 12/13/16 1239 98.7 F (37.1 C)     Temp src --      SpO2 12/13/16 1239 100 %     Weight --      Height --      Head Circumference --      Peak Flow --      Pain Score 12/13/16 1240 0     Pain Loc --      Pain Edu? --      Excl. in GC? --    No data found.  Updated Vital Signs BP Marland Kitchen(!)  114/96   Pulse 78   Temp 98.7 F (37.1 C)   Resp 18   LMP 11/15/2016   SpO2 100%   Physical Exam  Constitutional: She is oriented to person, place, and time. She appears well-developed and well-nourished. No distress.  HENT:  Head: Normocephalic and atraumatic.  Eyes: Conjunctivae are normal. Pupils are equal, round, and reactive to light.  Cardiovascular: Normal rate, regular rhythm and normal heart sounds. Exam reveals no gallop and no friction rub.  No murmur heard. Pulmonary/Chest: Effort normal and breath sounds normal. She has no wheezes. She has no rales.  Abdominal: Soft. Bowel sounds are normal. She exhibits no mass. There is no tenderness. There is no rebound, no guarding and no CVA tenderness.  Neurological: She is alert and oriented to person, place, and time.  Skin: Skin is warm and dry.  Psychiatric: She has a normal mood and affect. Her behavior is normal. Judgment normal.     UC Treatments / Results  Labs (all labs ordered are listed, but only abnormal results are displayed) Labs Reviewed  POCT PREGNANCY, URINE  URINE CYTOLOGY ANCILLARY ONLY    EKG  EKG Interpretation None       Radiology No results  found.  Procedures Procedures (including critical care time)  Medications Ordered in UC Medications - No data to display   Initial Impression / Assessment and Plan / UC Course  I have reviewed the triage vital signs and the nursing notes.  Pertinent labs & imaging results that were available during my care of the patient were reviewed by me and considered in my medical decision making (see chart for details).    Discussed empiric treatment for yeast, and to wait for other results for treatment. Patient would like to empirically treat for BV as well. Patient was treated empirically for yeast, BV. Diflucan and flagyl as directed. Cytology sent, patient will be contacted with any positive results that require additional treatment. Patient to refrain from sexual activity for the next 7 days. Return precautions given.    Final Clinical Impressions(s) / UC Diagnoses   Final diagnoses:  Vaginal discharge    ED Discharge Orders        Ordered    fluconazole (DIFLUCAN) 150 MG tablet  Daily     12/13/16 1332    metroNIDAZOLE (FLAGYL) 500 MG tablet  2 times daily     12/13/16 1332        Belinda FisherYu, Amy V, [place]PA-C 12/13/16 1352

## 2016-12-13 NOTE — ED Triage Notes (Signed)
Pt states "I took antibiotics a week ago for a tooth infection and I think I have a yeast infection, white discharge and itchy".

## 2016-12-14 LAB — URINE CYTOLOGY ANCILLARY ONLY
Chlamydia: NEGATIVE
NEISSERIA GONORRHEA: NEGATIVE
TRICH (WINDOWPATH): POSITIVE — AB

## 2016-12-18 LAB — URINE CYTOLOGY ANCILLARY ONLY
Bacterial vaginitis: NEGATIVE
Candida vaginitis: NEGATIVE

## 2017-01-10 ENCOUNTER — Encounter (HOSPITAL_COMMUNITY): Payer: Self-pay | Admitting: Emergency Medicine

## 2017-01-10 ENCOUNTER — Ambulatory Visit (HOSPITAL_COMMUNITY)
Admission: EM | Admit: 2017-01-10 | Discharge: 2017-01-10 | Disposition: A | Discharge status: Home / Self Care | Payer: Medicaid Other | Attending: Family Medicine | Admitting: Family Medicine

## 2017-01-10 ENCOUNTER — Other Ambulatory Visit: Payer: Self-pay

## 2017-01-10 DIAGNOSIS — N898 Other specified noninflammatory disorders of vagina: Secondary | ICD-10-CM

## 2017-01-10 DIAGNOSIS — N760 Acute vaginitis: Secondary | ICD-10-CM

## 2017-01-10 DIAGNOSIS — L299 Pruritus, unspecified: Secondary | ICD-10-CM

## 2017-01-10 DIAGNOSIS — B9689 Other specified bacterial agents as the cause of diseases classified elsewhere: Secondary | ICD-10-CM

## 2017-01-10 MED ORDER — METRONIDAZOLE 500 MG PO TABS: 500.0000 mg | ORAL_TABLET | Freq: Two times a day (BID) | ORAL | 0 refills | Status: DC

## 2017-01-10 MED ORDER — PERMETHRIN 5 % EX CREA: TOPICAL_CREAM | CUTANEOUS | 1 refills | Status: DC

## 2017-01-10 NOTE — ED Triage Notes (Addendum)
Seen 12/12.  Reports not taking medications correctly.  Patient has received a phone call and is requesting medication refill  Rash /insect bite to back.  patient concerned this may be scabies

## 2017-01-11 NOTE — ED Provider Notes (Signed)
  [facility]   [id number] 01/10/17 Arrival Time: 1656  ASSESSMENT & PLAN:  1. Vaginal discharge   2. Pruritus   3. Bacterial vaginosis     Meds ordered this encounter  Medications  . permethrin (ELIMITE) 5 % cream    Sig: Apply from neck down before bed then wash off in the morning. May repeat in one week.    Dispense:  60 g    Refill:  1  . metroNIDAZOLE (FLAGYL) 500 MG tablet    Sig: Take 1 tablet (500 mg total) by mouth 2 (two) times daily.    Dispense:  14 tablet    Refill:  0    Reviewed expectations re: course of current medical issues. Questions answered. Outlined signs and symptoms indicating need for more acute intervention. Patient verbalized understanding. After Visit Summary given.   SUBJECTIVE:  Carmen Johnston is a 37 y.o. female who presents with complaint of itching "kind of all over." Reports h/o scabies in the past and wonders if present. No specific rash. Afebrile. No OTC treatment. No recent travel. Stayed at friends house and later they told her they had scabies. No specific aggravating or alleviating factors reported.  Also requests refill of Flagyl after being dx with BV. "Didn't really take it the first time but I want to now." No new symptoms.  ROS: As per HPI.  OBJECTIVE: Vitals:   01/10/17 1820  BP: 113/88  Pulse: (!) 56  Resp: 18  Temp: 98.5 F (36.9 C)  TempSrc: Oral  SpO2: 100%    General appearance: alert; no distress Lungs: clear to auscultation bilaterally Heart: regular rate and rhythm Extremities: no edema Skin: warm and dry; no rashes Psychological: alert and cooperative; normal mood and affect  No Known Allergies  Past Medical History:  Diagnosis Date  . Abnormal Pap smear   . Anemia   . Chlamydia   . Kidney stone   . Pregnancy induced hypertension   . Trichomonas   . Urinary tract infection    Social History   Socioeconomic History  . Marital status: Single    Spouse name: Not on file  . Number of  children: Not on file  . Years of education: Not on file  . Highest education level: Not on file  Social Needs  . Financial resource strain: Not on file  . Food insecurity - worry: Not on file  . Food insecurity - inability: Not on file  . Transportation needs - medical: Not on file  . Transportation needs - non-medical: Not on file  Occupational History  . Not on file  Tobacco Use  . Smoking status: Current Every Day Smoker    Packs/day: 0.25    Years: 0.00    Pack years: 0.00    Types: Cigarettes  . Smokeless tobacco: Never Used  Substance and Sexual Activity  . Alcohol use: Yes    Comment: occ  . Drug use: No  . Sexual activity: Not on file  Other Topics Concern  . Not on file  Social History Narrative  . Not on file   Family History  Problem Relation Age of Onset  . Mental illness Mother   . Anesthesia problems Neg Hx    Past Surgical History:  Procedure Laterality Date  . COLPOSCOPY    . INDUCED ABORTION       Mardella LaymanHagler, Alida Greiner, MD 01/11/17 [id number]

## 2017-03-06 ENCOUNTER — Ambulatory Visit (HOSPITAL_COMMUNITY)
Admission: EM | Admit: 2017-03-06 | Discharge: 2017-03-06 | Discharge status: Against Medical Advice | Payer: Medicaid Other | Attending: Family Medicine | Admitting: Family Medicine

## 2017-03-06 NOTE — ED Notes (Signed)
Pt did not answer x 2 

## 2017-03-08 ENCOUNTER — Ambulatory Visit (HOSPITAL_COMMUNITY)
Admission: EM | Admit: 2017-03-08 | Discharge: 2017-03-08 | Disposition: A | Discharge status: Home / Self Care | Payer: Medicaid Other | Attending: Family Medicine | Admitting: Family Medicine

## 2017-03-08 ENCOUNTER — Encounter (HOSPITAL_COMMUNITY): Payer: Self-pay | Admitting: Emergency Medicine

## 2017-03-08 ENCOUNTER — Other Ambulatory Visit: Payer: Self-pay

## 2017-03-08 DIAGNOSIS — Z8619 Personal history of other infectious and parasitic diseases: Secondary | ICD-10-CM | POA: Insufficient documentation

## 2017-03-08 DIAGNOSIS — N898 Other specified noninflammatory disorders of vagina: Secondary | ICD-10-CM | POA: Diagnosis not present

## 2017-03-08 DIAGNOSIS — Z87442 Personal history of urinary calculi: Secondary | ICD-10-CM | POA: Insufficient documentation

## 2017-03-08 DIAGNOSIS — F17210 Nicotine dependence, cigarettes, uncomplicated: Secondary | ICD-10-CM | POA: Insufficient documentation

## 2017-03-08 DIAGNOSIS — Z87440 Personal history of urinary (tract) infections: Secondary | ICD-10-CM | POA: Diagnosis not present

## 2017-03-08 DIAGNOSIS — Z818 Family history of other mental and behavioral disorders: Secondary | ICD-10-CM | POA: Insufficient documentation

## 2017-03-08 LAB — POCT URINALYSIS DIP (DEVICE)
BILIRUBIN URINE: NEGATIVE
GLUCOSE, UA: NEGATIVE mg/dL
LEUKOCYTES UA: NEGATIVE
Nitrite: NEGATIVE
PH: 6 (ref 5.0–8.0)
Protein, ur: 30 mg/dL — AB
Specific Gravity, Urine: 1.025 (ref 1.005–1.030)
Urobilinogen, UA: 0.2 mg/dL (ref 0.0–1.0)

## 2017-03-08 LAB — POCT PREGNANCY, URINE: PREG TEST UR: NEGATIVE

## 2017-03-08 MED ORDER — CLINDAMYCIN HCL 300 MG PO CAPS: 300.0000 mg | ORAL_CAPSULE | Freq: Two times a day (BID) | ORAL | 0 refills | Status: AC

## 2017-03-08 MED ORDER — POVIDONE-IODINE 10 % EX SOLN
CUTANEOUS | Status: AC
  Filled 2017-03-08: qty 118

## 2017-03-08 NOTE — ED Triage Notes (Signed)
Patient has a history of bv.  Patient has a discharge with an odor

## 2017-03-08 NOTE — ED Provider Notes (Signed)
[facility]    CSN: [id number] Arrival date & time: 03/08/17  1459     History   Chief Complaint Chief Complaint  Patient presents with  . Vaginal Discharge    HPI Carmen Johnston is a 37 y.o. female.   Carmen Johnston presents with complaints of vaginal odor which started a few days ago after her period completed. She states she has minimal vaginal discharge. Without vaginal pain or itching. Denies any urinary symptoms. Denies abdominal pain, back pain or fevers. She is sexually active, states she has used condoms. Was treated for trich back in December, states she did not take the last 3-4 pills as they upset her stomach, but her symptoms had resolved until the past few days. She is concerned about BV today. Hx of std's but otherwise without contributing medical history. Denies current concern for stds.   ROS per HPI.       Past Medical History:  Diagnosis Date  . Abnormal Pap smear   . Anemia   . Chlamydia   . Kidney stone   . Pregnancy induced hypertension   . Trichomonas   . Urinary tract infection     There are no active problems to display for this patient.   Past Surgical History:  Procedure Laterality Date  . COLPOSCOPY    . INDUCED ABORTION      OB History    Gravida Para Term Preterm AB Living   8 4 4  0 3 4   SAB TAB Ectopic Multiple Live Births   1 1 1   4        Home Medications    Prior to Admission medications   Medication Sig Start Date End Date Taking? Authorizing Provider  clindamycin (CLEOCIN) 300 MG capsule Take 1 capsule (300 mg total) by mouth 2 (two) times daily for 7 days. 03/08/17 03/15/17  Georgetta HaberBurky, Natalie B, NP    Family History Family History  Problem Relation Age of Onset  . Mental illness Mother   . Anesthesia problems Neg Hx     Social History Social History   Tobacco Use  . Smoking status: Current Every Day Smoker    Packs/day: 0.25    Years: 0.00    Pack years: 0.00    Types: Cigarettes  . Smokeless tobacco:  Never Used  Substance Use Topics  . Alcohol use: Yes    Comment: occ  . Drug use: No     Allergies   Patient has no known allergies.   Review of Systems Review of Systems   Physical Exam Triage Vital Signs ED Triage Vitals [03/08/17 1542]  Enc Vitals Group     BP 129/81     Pulse Rate 73     Resp 18     Temp 98.2 F (36.8 C)     Temp Source Oral     SpO2 100 %     Weight      Height      Head Circumference      Peak Flow      Pain Score      Pain Loc      Pain Edu?      Excl. in GC?    No data found.  Updated Vital Signs BP 129/81 (BP Location: Left Arm)   Pulse 73   Temp 98.2 F (36.8 C) (Oral)   Resp 18   LMP 03/05/2017   SpO2 100%   Visual Acuity Right Eye Distance:   Left Eye  Distance:   Bilateral Distance:    Right Eye Near:   Left Eye Near:    Bilateral Near:     Physical Exam  Constitutional: She is oriented to person, place, and time. She appears well-developed and well-nourished. No distress.  Cardiovascular: Normal rate, regular rhythm and normal heart sounds.  Pulmonary/Chest: Effort normal and breath sounds normal.  Abdominal: Soft. She exhibits no distension. There is no tenderness. There is no guarding.  Neurological: She is alert and oriented to person, place, and time.  Skin: Skin is warm and dry.     UC Treatments / Results  Labs (all labs ordered are listed, but only abnormal results are displayed) Labs Reviewed  POCT URINALYSIS DIP (DEVICE) - Abnormal; Notable for the following components:      Result Value   Ketones, ur TRACE (*)    Hgb urine dipstick TRACE (*)    Protein, ur 30 (*)    All other components within normal limits  POCT PREGNANCY, URINE  URINE CYTOLOGY ANCILLARY ONLY    EKG  EKG Interpretation None       Radiology No results found.  Procedures Procedures (including critical care time)  Medications Ordered in UC Medications - No data to display   Initial Impression / Assessment and Plan /  UC Course  I have reviewed the triage vital signs and the nursing notes.  Pertinent labs & imaging results that were available during my care of the patient were reviewed by me and considered in my medical decision making (see chart for details).     Opted to start treatment for BV, patient did not tolerate metronidazole in the past, clinda started. Discussed that if trich again positive will need metronidazole. Encouraged safe sex practices. Urine cytology pending. Patient verbalized understanding and agreeable to plan.    Final Clinical Impressions(s) / UC Diagnoses   Final diagnoses:  Vaginal odor    ED Discharge Orders        Ordered    clindamycin (CLEOCIN) 300 MG capsule  2 times daily     03/08/17 1617       Controlled Substance Prescriptions [place] Controlled Substance Registry consulted? Not Applicable   Georgetta Haber, NP 03/08/17 1623

## 2017-03-08 NOTE — ED Notes (Signed)
Sent patient for a dirty and clean urine with instructions 

## 2017-03-08 NOTE — Discharge Instructions (Signed)
Complete course of antibiotics. This is for Bv, if you have trichomonas you will need another medication.  Please withhold from intercourse for the next week. Please use condoms to prevent STD's.  Will notify you of any positive findings and if any changes to treatment are needed.

## 2017-03-10 LAB — URINE CYTOLOGY ANCILLARY ONLY
CHLAMYDIA, DNA PROBE: NEGATIVE
NEISSERIA GONORRHEA: NEGATIVE
TRICH (WINDOWPATH): NEGATIVE

## 2017-03-12 LAB — URINE CYTOLOGY ANCILLARY ONLY: Candida vaginitis: NEGATIVE

## 2017-09-15 DIAGNOSIS — R202 Paresthesia of skin: Secondary | ICD-10-CM | POA: Diagnosis not present

## 2017-09-15 DIAGNOSIS — R0789 Other chest pain: Secondary | ICD-10-CM | POA: Diagnosis not present

## 2017-09-16 ENCOUNTER — Other Ambulatory Visit: Payer: Self-pay

## 2017-09-16 ENCOUNTER — Encounter (HOSPITAL_COMMUNITY): Payer: Self-pay | Admitting: Emergency Medicine

## 2017-09-16 ENCOUNTER — Emergency Department (HOSPITAL_COMMUNITY)
Admission: EM | Admit: 2017-09-16 | Discharge: 2017-09-16 | Disposition: A | Discharge status: Home / Self Care | Payer: Medicaid Other | Attending: Emergency Medicine | Admitting: Emergency Medicine

## 2017-09-16 ENCOUNTER — Emergency Department (HOSPITAL_COMMUNITY): Payer: Medicaid Other

## 2017-09-16 DIAGNOSIS — R202 Paresthesia of skin: Secondary | ICD-10-CM

## 2017-09-16 DIAGNOSIS — F17210 Nicotine dependence, cigarettes, uncomplicated: Secondary | ICD-10-CM | POA: Insufficient documentation

## 2017-09-16 DIAGNOSIS — R0789 Other chest pain: Secondary | ICD-10-CM | POA: Diagnosis not present

## 2017-09-16 DIAGNOSIS — R079 Chest pain, unspecified: Secondary | ICD-10-CM | POA: Diagnosis not present

## 2017-09-16 LAB — CBC
HCT: 37.9 % (ref 36.0–46.0)
Hemoglobin: 11.3 g/dL — ABNORMAL LOW (ref 12.0–15.0)
MCH: 25.6 pg — AB (ref 26.0–34.0)
MCHC: 29.8 g/dL — AB (ref 30.0–36.0)
MCV: 85.7 fL (ref 78.0–100.0)
PLATELETS: 248 10*3/uL (ref 150–400)
RBC: 4.42 MIL/uL (ref 3.87–5.11)
RDW: 14 % (ref 11.5–15.5)
WBC: 5.9 10*3/uL (ref 4.0–10.5)

## 2017-09-16 LAB — BASIC METABOLIC PANEL
Anion gap: 9 (ref 5–15)
BUN: 10 mg/dL (ref 6–20)
CALCIUM: 9 mg/dL (ref 8.9–10.3)
CO2: 23 mmol/L (ref 22–32)
Chloride: 106 mmol/L (ref 98–111)
Creatinine, Ser: 0.92 mg/dL (ref 0.44–1.00)
GFR calc non Af Amer: >60 mL/min (ref >60)
Glucose, Bld: 74 mg/dL (ref 70–99)
Potassium: 4.1 mmol/L (ref 3.5–5.1)
Sodium: 138 mmol/L (ref 135–145)

## 2017-09-16 LAB — I-STAT BETA HCG BLOOD, ED (MC, WL, AP ONLY): I-stat hCG, quantitative: <5 m[IU]/mL (ref <5)

## 2017-09-16 LAB — I-STAT TROPONIN, ED: TROPONIN I, POC: 0 ng/mL (ref 0.00–0.08)

## 2017-09-16 NOTE — Discharge Instructions (Addendum)
Continue using Tylenol or ibuprofen as needed for pain. Follow-up with cardiology for further evaluation of chest pain. Follow-up with orthopedics for further evaluation of your leg and back symptoms. Return to the emergency room if you develop chest pain with difficulty breathing, nausea, and sweatiness.  Return if you develop severe pain in your leg with swelling.  Return if your foot becomes cool, pale, and painful.  Return with any new or worsening symptoms.
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
## 2017-09-16 NOTE — ED Notes (Signed)
Patient verbalizes understanding of discharge instructions. Opportunity for questioning and answers were provided. Armband removed by staff, pt discharged from ED.  

## 2017-09-16 NOTE — ED Triage Notes (Signed)
Pt. Stated, Ive had chest pain and left leg pain , started a month ago. My toes feel numb for a couple of weeks.

## 2018-01-04 ENCOUNTER — Emergency Department (HOSPITAL_COMMUNITY): Payer: Medicaid Other

## 2018-01-04 ENCOUNTER — Emergency Department (HOSPITAL_COMMUNITY)
Admission: EM | Admit: 2018-01-04 | Discharge: 2018-01-05 | Disposition: A | Discharge status: Home / Self Care | Payer: Medicaid Other | Attending: Emergency Medicine | Admitting: Emergency Medicine

## 2018-01-04 ENCOUNTER — Other Ambulatory Visit: Payer: Self-pay

## 2018-01-04 ENCOUNTER — Encounter (HOSPITAL_COMMUNITY): Payer: Self-pay | Admitting: Emergency Medicine

## 2018-01-04 DIAGNOSIS — R079 Chest pain, unspecified: Secondary | ICD-10-CM | POA: Diagnosis present

## 2018-01-04 DIAGNOSIS — Z5321 Procedure and treatment not carried out due to patient leaving prior to being seen by health care provider: Secondary | ICD-10-CM | POA: Diagnosis not present

## 2018-01-04 DIAGNOSIS — R0789 Other chest pain: Secondary | ICD-10-CM | POA: Diagnosis not present

## 2018-01-04 DIAGNOSIS — R202 Paresthesia of skin: Secondary | ICD-10-CM | POA: Diagnosis not present

## 2018-01-04 LAB — DIFFERENTIAL
Abs Immature Granulocytes: 0.01 10*3/uL (ref 0.00–0.07)
BASOS ABS: 0 10*3/uL (ref 0.0–0.1)
Basophils Relative: 1 %
EOS PCT: 6 %
Eosinophils Absolute: 0.3 10*3/uL (ref 0.0–0.5)
IMMATURE GRANULOCYTES: 0 %
Lymphocytes Relative: 41 %
Lymphs Abs: 1.6 10*3/uL (ref 0.7–4.0)
Monocytes Absolute: 0.3 10*3/uL (ref 0.1–1.0)
Monocytes Relative: 7 %
Neutro Abs: 1.8 10*3/uL (ref 1.7–7.7)
Neutrophils Relative %: 45 %

## 2018-01-04 LAB — I-STAT CHEM 8, ED
BUN: 9 mg/dL (ref 6–20)
CHLORIDE: 104 mmol/L (ref 98–111)
Calcium, Ion: 1.15 mmol/L (ref 1.15–1.40)
Creatinine, Ser: 0.7 mg/dL (ref 0.44–1.00)
Glucose, Bld: 82 mg/dL (ref 70–99)
HEMATOCRIT: 39 % (ref 36.0–46.0)
Hemoglobin: 13.3 g/dL (ref 12.0–15.0)
POTASSIUM: 3.8 mmol/L (ref 3.5–5.1)
SODIUM: 137 mmol/L (ref 135–145)
TCO2: 24 mmol/L (ref 22–32)

## 2018-01-04 LAB — COMPREHENSIVE METABOLIC PANEL
ALBUMIN: 3.9 g/dL (ref 3.5–5.0)
ALK PHOS: 43 U/L (ref 38–126)
ALT: 24 U/L (ref 0–44)
ANION GAP: 9 (ref 5–15)
AST: 26 U/L (ref 15–41)
BUN: 9 mg/dL (ref 6–20)
CHLORIDE: 105 mmol/L (ref 98–111)
CO2: 22 mmol/L (ref 22–32)
Calcium: 9 mg/dL (ref 8.9–10.3)
Creatinine, Ser: 0.77 mg/dL (ref 0.44–1.00)
GFR calc Af Amer: >60 mL/min (ref >60)
GFR calc non Af Amer: >60 mL/min (ref >60)
GLUCOSE: 86 mg/dL (ref 70–99)
POTASSIUM: 3.9 mmol/L (ref 3.5–5.1)
SODIUM: 136 mmol/L (ref 135–145)
Total Bilirubin: 1.2 mg/dL (ref 0.3–1.2)
Total Protein: 7.3 g/dL (ref 6.5–8.1)

## 2018-01-04 LAB — CBC
HCT: 38.5 % (ref 36.0–46.0)
HEMOGLOBIN: 11.3 g/dL — AB (ref 12.0–15.0)
MCH: 24.2 pg — ABNORMAL LOW (ref 26.0–34.0)
MCHC: 29.4 g/dL — ABNORMAL LOW (ref 30.0–36.0)
MCV: 82.6 fL (ref 80.0–100.0)
NRBC: 0 % (ref 0.0–0.2)
Platelets: 261 10*3/uL (ref 150–400)
RBC: 4.66 MIL/uL (ref 3.87–5.11)
RDW: 13.8 % (ref 11.5–15.5)
WBC: 4 10*3/uL (ref 4.0–10.5)

## 2018-01-04 LAB — I-STAT BETA HCG BLOOD, ED (MC, WL, AP ONLY): I-stat hCG, quantitative: <5 m[IU]/mL (ref <5)

## 2018-01-04 LAB — PROTIME-INR
INR: 1
Prothrombin Time: 13.1 seconds (ref 11.4–15.2)

## 2018-01-04 LAB — APTT: aPTT: 27 seconds (ref 24–36)

## 2018-01-04 LAB — I-STAT TROPONIN, ED: Troponin i, poc: 0 ng/mL (ref 0.00–0.08)

## 2018-01-04 NOTE — ED Triage Notes (Signed)
Pt also reports left arm weakness that started yesterday. LKW 01/03/2018 1700. Pt woke up today still with her arm feeling weak and now her leg as well. Sensory deficit noted. No other neuro sx. Pt reports left sided cp as well. Pt is ambulatory.

## 2018-01-05 NOTE — ED Notes (Signed)
Pt. left due to wait time. This tech encouraged Pt. to stay and instructed Pt. to please return if symptoms worsened.

## 2018-01-11 DIAGNOSIS — H40033 Anatomical narrow angle, bilateral: Secondary | ICD-10-CM | POA: Diagnosis not present

## 2018-01-11 DIAGNOSIS — G44209 Tension-type headache, unspecified, not intractable: Secondary | ICD-10-CM | POA: Diagnosis not present

## 2018-01-13 DIAGNOSIS — H5213 Myopia, bilateral: Secondary | ICD-10-CM | POA: Diagnosis not present

## 2018-01-16 ENCOUNTER — Ambulatory Visit (HOSPITAL_COMMUNITY)
Admission: EM | Admit: 2018-01-16 | Discharge: 2018-01-16 | Disposition: A | Discharge status: Home / Self Care | Payer: Medicaid Other | Attending: Family Medicine | Admitting: Family Medicine

## 2018-01-16 ENCOUNTER — Encounter (HOSPITAL_COMMUNITY): Payer: Self-pay | Admitting: Family Medicine

## 2018-01-16 DIAGNOSIS — L858 Other specified epidermal thickening: Secondary | ICD-10-CM | POA: Insufficient documentation

## 2018-01-16 MED ORDER — TRIAMCINOLONE ACETONIDE 0.1 % EX CREA: 1.0000 "application " | TOPICAL_CREAM | Freq: Two times a day (BID) | CUTANEOUS | 1 refills | Status: DC

## 2018-01-16 NOTE — ED Triage Notes (Signed)
Pt here for a rash.  Pt was questioned by Dr. Milus Glazier

## 2018-01-16 NOTE — ED Notes (Signed)
Self swab placed in lab 

## 2018-01-16 NOTE — ED Provider Notes (Addendum)
[facility]    CSN: [id number] Arrival date & time: 01/16/18  1517     History   Chief Complaint Chief Complaint  Patient presents with  . Rash    HPI Carmen Johnston is a 38 y.o. female.   This 38 year old woman, and [place], [facility] urgent care patient, presents with rash for evaluation.  Patient says that she has had scabies before and wants to make sure this is not scabies.  For the last 2 weeks she has had little bumps on her arms and nowhere else.  They are itchy at times.  Patient also wants to be checked for BV.  Patient having some irritation for last several days.     Past Medical History:  Diagnosis Date  . Abnormal Pap smear   . Anemia   . Chlamydia   . Kidney stone   . Pregnancy induced hypertension   . Trichomonas   . Urinary tract infection     There are no active problems to display for this patient.   Past Surgical History:  Procedure Laterality Date  . COLPOSCOPY    . INDUCED ABORTION      OB History    Gravida  8   Para  4   Term  4   Preterm  0   AB  3   Living  4     SAB  1   TAB  1   Ectopic  1   Multiple      Live Births  4            Home Medications    Prior to Admission medications   Medication Sig Start Date End Date Taking? Authorizing Provider  triamcinolone cream (KENALOG) 0.1 % Apply 1 application topically 2 (two) times daily. 01/16/18   Elvina Sidle, MD    Family History Family History  Problem Relation Age of Onset  . Mental illness Mother   . Anesthesia problems Neg Hx     Social History Social History   Tobacco Use  . Smoking status: Former Smoker    Packs/day: 0.25    Years: 0.00    Pack years: 0.00    Types: Cigarettes    Last attempt to quit: 01/04/2017    Years since quitting: 1.0  . Smokeless tobacco: Never Used  Substance Use Topics  . Alcohol use: Yes    Comment: occ  . Drug use: No     Allergies   Patient has no known allergies.   Review of  Systems Review of Systems   Physical Exam Triage Vital Signs ED Triage Vitals  Enc Vitals Group     BP      Pulse      Resp      Temp      Temp src      SpO2      Weight      Height      Head Circumference      Peak Flow      Pain Score      Pain Loc      Pain Edu?      Excl. in GC?    No data found.  Updated Vital Signs BP 127/86 (BP Location: Left Arm)   Pulse 89   Temp 98.2 F (36.8 C) (Oral)   Resp 18   LMP 01/02/2018 (Exact Date)   SpO2 99%       Physical Exam Vitals signs and nursing  note reviewed.  Constitutional:      Appearance: Normal appearance.  HENT:     Head: Normocephalic.     Nose: Nose normal.  Pulmonary:     Effort: Pulmonary effort is normal.  Musculoskeletal: Normal range of motion.  Skin:    General: Skin is warm and dry.     Comments: Very fine papules on forearms and backs of hands with no interdigital lesions  Neurological:     General: No focal deficit present.     Mental Status: She is alert.  Psychiatric:        Mood and Affect: Mood normal.      UC Treatments / Results  Labs (all labs ordered are listed, but only abnormal results are displayed) Labs Reviewed  CERVICOVAGINAL ANCILLARY ONLY    EKG None  Radiology No results found.  Procedures Procedures (including critical care time)  Medications Ordered in UC Medications - No data to display  Initial Impression / Assessment and Plan / UC Course  I have reviewed the triage vital signs and the nursing notes.  Pertinent labs & imaging results that were available during my care of the patient were reviewed by me and considered in my medical decision making (see chart for details).    Final Clinical Impressions(s) / UC Diagnoses   Final diagnoses:  Keratosis pilaris     Discharge Instructions     The rash appears to be dry skin which is seen more in the winter.  You can use the cream provided when the skin is itchy or rough.    ED Prescriptions     Medication Sig Dispense Auth. Provider   triamcinolone cream (KENALOG) 0.1 % Apply 1 application topically 2 (two) times daily. 80 g Elvina SidleLauenstein, Kindel Rochefort, MD     Controlled Substance Prescriptions [place] Controlled Substance Registry consulted? Not Applicable   Elvina SidleLauenstein, Allard Lightsey, MD 01/16/18 1540    Elvina SidleLauenstein, Kenan Moodie, MD 01/16/18 1544

## 2018-01-16 NOTE — Discharge Instructions (Signed)
The rash appears to be dry skin which is seen more in the winter.  You can use the cream provided when the skin is itchy or rough.

## 2018-01-17 LAB — CERVICOVAGINAL ANCILLARY ONLY
Bacterial vaginitis: POSITIVE — AB
Candida vaginitis: NEGATIVE
Chlamydia: NEGATIVE
Neisseria Gonorrhea: NEGATIVE
Trichomonas: POSITIVE — AB

## 2018-01-18 ENCOUNTER — Telehealth (HOSPITAL_COMMUNITY): Payer: Self-pay | Admitting: Emergency Medicine

## 2018-01-18 MED ORDER — METRONIDAZOLE 500 MG PO TABS: 500.0000 mg | ORAL_TABLET | Freq: Two times a day (BID) | ORAL | 0 refills | Status: DC

## 2018-01-18 NOTE — Telephone Encounter (Signed)
Bacterial vaginosis is positive. This was not treated at the urgent care visit.  Flagyl 500 mg BID x 7 days #14 no refills sent to patients pharmacy of choice.    Trichomonas is positive. Rx  for Flagyl, once was sent to the pharmacy of record. Pt needs education to refrain from sexual intercourse for 7 days to give the medicine time to work. Sexual partners need to be notified and tested/treated. Condoms may reduce risk of reinfection. Recheck for further evaluation if symptoms are not improving.   Patient contacted and made aware of all results, all questions answered.    

## 2018-04-12 DIAGNOSIS — D509 Iron deficiency anemia, unspecified: Secondary | ICD-10-CM | POA: Diagnosis not present

## 2018-04-12 DIAGNOSIS — R109 Unspecified abdominal pain: Secondary | ICD-10-CM | POA: Diagnosis not present

## 2018-04-12 DIAGNOSIS — Z6832 Body mass index (BMI) 32.0-32.9, adult: Secondary | ICD-10-CM | POA: Diagnosis not present

## 2018-04-12 DIAGNOSIS — K5900 Constipation, unspecified: Secondary | ICD-10-CM | POA: Diagnosis not present

## 2018-04-15 ENCOUNTER — Other Ambulatory Visit: Payer: Self-pay

## 2018-04-16 ENCOUNTER — Ambulatory Visit (INDEPENDENT_AMBULATORY_CARE_PROVIDER_SITE_OTHER): Payer: Medicaid Other | Admitting: Gastroenterology

## 2018-04-16 ENCOUNTER — Encounter: Payer: Self-pay | Admitting: Gastroenterology

## 2018-04-16 ENCOUNTER — Other Ambulatory Visit: Payer: Self-pay

## 2018-04-16 DIAGNOSIS — R140 Abdominal distension (gaseous): Secondary | ICD-10-CM

## 2018-04-16 DIAGNOSIS — R12 Heartburn: Secondary | ICD-10-CM | POA: Diagnosis not present

## 2018-04-16 DIAGNOSIS — R0789 Other chest pain: Secondary | ICD-10-CM | POA: Diagnosis not present

## 2018-04-16 DIAGNOSIS — K5909 Other constipation: Secondary | ICD-10-CM

## 2018-04-16 DIAGNOSIS — D649 Anemia, unspecified: Secondary | ICD-10-CM | POA: Diagnosis not present

## 2018-04-16 DIAGNOSIS — R1010 Upper abdominal pain, unspecified: Secondary | ICD-10-CM

## 2018-04-16 MED ORDER — OMEPRAZOLE 40 MG PO CPDR: 40.0000 mg | DELAYED_RELEASE_CAPSULE | Freq: Every day | ORAL | 0 refills | Status: DC

## 2018-04-16 MED ORDER — POLYETHYLENE GLYCOL 3350 17 G PO PACK: 17.0000 g | PACK | Freq: Every day | ORAL | 2 refills | Status: DC
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
## 2018-05-21 ENCOUNTER — Telehealth: Payer: Self-pay | Admitting: General Surgery

## 2018-05-21 ENCOUNTER — Ambulatory Visit: Payer: Medicaid Other | Admitting: Gastroenterology

## 2018-05-21 NOTE — Telephone Encounter (Signed)
Covid-19 travel screening questions  Have you traveled in the last 14 days? NO If yes where?  Do you now or have you had a fever in the last 14 days? NO  Do you have any respiratory symptoms of shortness of breath or cough now or in the last 14 days? NO  Do you have a medical history of Congestive Heart Failure? NO  Do you have a medical history of lung disease? NO  Do you have any family members or close contacts with diagnosed or suspected Covid-19? NO        

## 2018-09-15 ENCOUNTER — Encounter (HOSPITAL_COMMUNITY): Payer: Self-pay

## 2018-09-15 ENCOUNTER — Ambulatory Visit (HOSPITAL_COMMUNITY)
Admission: EM | Admit: 2018-09-15 | Discharge: 2018-09-15 | Disposition: A | Discharge status: Home / Self Care | Payer: Medicaid Other | Attending: Family Medicine | Admitting: Family Medicine

## 2018-09-15 ENCOUNTER — Other Ambulatory Visit: Payer: Self-pay

## 2018-09-15 DIAGNOSIS — N390 Urinary tract infection, site not specified: Secondary | ICD-10-CM | POA: Insufficient documentation

## 2018-09-15 MED ORDER — NITROFURANTOIN MONOHYD MACRO 100 MG PO CAPS: 100.0000 mg | ORAL_CAPSULE | Freq: Two times a day (BID) | ORAL | 0 refills | Status: DC

## 2018-09-15 NOTE — ED Notes (Signed)
Urinalysis testing exceeded the limitations of the [employer] capabilities. Notified provider 

## 2018-09-15 NOTE — ED Provider Notes (Signed)
[facility]    CSN: [id number] Arrival date & time: 09/15/18  1426      History   Chief Complaint Chief Complaint  Patient presents with  . Urinary Tract Infection    HPI Carmen Johnston is a 38 y.o. female.   Patient has history of UTIs.  Symptoms started 2 days ago with frequency dysuria and feeling of incomplete emptying.  Denies fever chills or back pain.  HPI  Past Medical History:  Diagnosis Date  . Abnormal Pap smear   . Anemia   . Chlamydia   . Kidney stone   . Pregnancy induced hypertension   . Trichomonas   . Urinary tract infection     There are no active problems to display for this patient.   Past Surgical History:  Procedure Laterality Date  . COLPOSCOPY    . INDUCED ABORTION      OB History    Gravida  8   Para  4   Term  4   Preterm  0   AB  3   Living  4     SAB  1   TAB  1   Ectopic  1   Multiple      Live Births  4            Home Medications    Prior to Admission medications   Medication Sig Start Date End Date Taking? Authorizing Provider  Multiple Vitamins-Minerals (MULTIVITAMIN WITH MINERALS) tablet Take 1 tablet by mouth daily.    [provider]  omeprazole (PRILOSEC) 40 MG capsule Take 1 capsule (40 mg total) by mouth daily. 04/16/18   Nelida Meuse III, MD  polyethylene glycol (MIRALAX / GLYCOLAX) 17 g packet Take 17 g by mouth daily. Start with half capful daily and adjust as needed 04/16/18   Doran Stabler, MD    Family History Family History  Problem Relation Age of Onset  . Mental illness Mother   . Anesthesia problems Neg Hx   . Stomach cancer Neg Hx   . Colon cancer Neg Hx   . Pancreatic cancer Neg Hx     Social History Social History   Tobacco Use  . Smoking status: Former Smoker    Packs/day: 0.25    Years: 0.00    Pack years: 0.00    Types: Cigarettes    Quit date: 01/04/2017    Years since quitting: 1.6  . Smokeless tobacco: Never Used  Substance Use Topics   . Alcohol use: Yes    Comment: occ  . Drug use: No     Allergies   Patient has no known allergies.   Review of Systems Review of Systems  Constitutional: Negative.   Genitourinary: Positive for dysuria and frequency.     Physical Exam Triage Vital Signs ED Triage Vitals  Enc Vitals Group     BP 09/15/18 1453 (!) 128/97     Pulse Rate 09/15/18 1453 78     Resp 09/15/18 1453 18     Temp 09/15/18 1453 98.2 F (36.8 C)     Temp Source 09/15/18 1453 Oral     SpO2 --      Weight 09/15/18 1452 190 lb (86.2 kg)     Height --      Head Circumference --      Peak Flow --      Pain Score 09/15/18 1452 7     Pain Loc --  Pain Edu? --      Excl. in GC? --    No data found.  Updated Vital Signs BP (!) 128/97 (BP Location: Right Arm)   Pulse 78   Temp 98.2 F (36.8 C) (Oral)   Resp 18   Wt 86.2 kg   LMP 08/27/2018   BMI 29.76 kg/m   Visual Acuity Right Eye Distance:   Left Eye Distance:   Bilateral Distance:    Right Eye Near:   Left Eye Near:    Bilateral Near:     Physical Exam Vitals signs and nursing note reviewed.  Constitutional:      Appearance: Normal appearance.  Cardiovascular:     Rate and Rhythm: Normal rate and regular rhythm.  Pulmonary:     Effort: Pulmonary effort is normal.     Breath sounds: Normal breath sounds.  Abdominal:     Comments: No CVA tenderness.  There is some slight suprapubic pain with palpation.  Neurological:     Mental Status: She is alert.      UC Treatments / Results  Labs (all labs ordered are listed, but only abnormal results are displayed) Labs Reviewed - No data to display  EKG   Radiology No results found.  Procedures Procedures (including critical care time)  Medications Ordered in UC Medications - No data to display  Initial Impression / Assessment and Plan / UC Course  I have reviewed the triage vital signs and the nursing notes.  Pertinent labs & imaging results that were available  during my care of the patient were reviewed by me and considered in my medical decision making (see chart for details).     UTI Final Clinical Impressions(s) / UC Diagnoses   Final diagnoses:  None   Discharge Instructions   None    ED Prescriptions    None     Controlled Substance Prescriptions [place] Controlled Substance Registry consulted? No   Frederica KusterMiller, Stephen M, MD 09/15/18 1530

## 2018-09-15 NOTE — ED Triage Notes (Signed)
Pt states she has a UTI. X 2 days

## 2018-09-16 LAB — URINE CULTURE: Culture: 40000 — AB

## 2018-09-23 ENCOUNTER — Telehealth (HOSPITAL_COMMUNITY): Payer: Self-pay | Admitting: Emergency Medicine

## 2018-09-23 ENCOUNTER — Other Ambulatory Visit: Payer: Self-pay

## 2018-09-23 ENCOUNTER — Ambulatory Visit (HOSPITAL_COMMUNITY)
Admission: EM | Admit: 2018-09-23 | Discharge: 2018-09-23 | Disposition: A | Discharge status: Home / Self Care | Payer: Medicaid Other | Attending: Urgent Care | Admitting: Urgent Care

## 2018-09-23 ENCOUNTER — Encounter (HOSPITAL_COMMUNITY): Payer: Self-pay | Admitting: Emergency Medicine

## 2018-09-23 DIAGNOSIS — R3915 Urgency of urination: Secondary | ICD-10-CM

## 2018-09-23 DIAGNOSIS — Z3202 Encounter for pregnancy test, result negative: Secondary | ICD-10-CM | POA: Diagnosis not present

## 2018-09-23 DIAGNOSIS — Z87442 Personal history of urinary calculi: Secondary | ICD-10-CM | POA: Diagnosis not present

## 2018-09-23 DIAGNOSIS — R350 Frequency of micturition: Secondary | ICD-10-CM | POA: Diagnosis not present

## 2018-09-23 LAB — POCT URINALYSIS DIP (DEVICE)
Glucose, UA: NEGATIVE mg/dL
Ketones, ur: 15 mg/dL — AB
Nitrite: NEGATIVE
Protein, ur: 30 mg/dL — AB
Specific Gravity, Urine: 1.025 (ref 1.005–1.030)
Urobilinogen, UA: 2 mg/dL — ABNORMAL HIGH (ref 0.0–1.0)
pH: 6 (ref 5.0–8.0)

## 2018-09-23 LAB — POCT PREGNANCY, URINE: Preg Test, Ur: NEGATIVE

## 2018-09-23 NOTE — ED Triage Notes (Signed)
Pt here for UTI sx not improving; culture was not clear per pt and she needs another sample collected

## 2018-09-23 NOTE — Telephone Encounter (Signed)
Pt called statign she was not feeling better since she was treated for UTI. Pt was given macrobid. Urine culture is contaminated. Pt instructed to return to be reseen, we can recollect urine, and treat with new antibiotic. Pt agreeable to plan.

## 2018-09-23 NOTE — ED Provider Notes (Signed)
MRN: [id number] DOB: 07/05/1980  Subjective:   Carmen Johnston is a 38 y.o. female presenting for recheck on UTI.  Patient has a history of renal stone, UTI.  Last office visit was 09/15/2018 and was given prescription for Macrobid, urine culture was equivocal.  Reports that she had intermittent improvement with Macrobid only.  Has never seen a urologist.  No current facility-administered medications for this encounter.   Current Outpatient Medications:  Marland Kitchen.  Multiple Vitamins-Minerals (MULTIVITAMIN WITH MINERALS) tablet, Take 1 tablet by mouth daily., Disp: , Rfl:  .  nitrofurantoin, macrocrystal-monohydrate, (MACROBID) 100 MG capsule, Take 1 capsule (100 mg total) by mouth 2 (two) times daily., Disp: 10 capsule, Rfl: 0 .  omeprazole (PRILOSEC) 40 MG capsule, Take 1 capsule (40 mg total) by mouth daily., Disp: 90 capsule, Rfl: 0 .  polyethylene glycol (MIRALAX / GLYCOLAX) 17 g packet, Take 17 g by mouth daily. Start with half capful daily and adjust as needed, Disp: 30 each, Rfl: 2    No Known Allergies   Past Medical History:  Diagnosis Date  . Abnormal Pap smear   . Anemia   . Chlamydia   . Kidney stone   . Pregnancy induced hypertension   . Trichomonas   . Urinary tract infection      Past Surgical History:  Procedure Laterality Date  . COLPOSCOPY    . INDUCED ABORTION      Review of Systems  Constitutional: Negative for chills and fever.  Respiratory: Negative for shortness of breath.   Cardiovascular: Negative for chest pain.  Gastrointestinal: Negative for abdominal pain, nausea and vomiting.  Genitourinary: Positive for frequency and urgency. Negative for dysuria, flank pain and hematuria.  Musculoskeletal: Negative for myalgias.  Skin: Negative for rash.  Neurological: Negative for dizziness and headaches.    Objective:   Vitals: BP 134/82 (BP Location: Right Arm)   Pulse 68   Temp 98.4 F (36.9 C) (Oral)   Resp 18   LMP 08/27/2018   SpO2 100%   Physical  Exam Constitutional:      General: She is not in acute distress.    Appearance: Normal appearance. She is well-developed. She is obese. She is not ill-appearing, toxic-appearing or diaphoretic.  HENT:     Head: Normocephalic and atraumatic.     Right Ear: External ear normal.     Left Ear: External ear normal.     Nose: Nose normal.     Mouth/Throat:     Mouth: Mucous membranes are moist.     Pharynx: Oropharynx is clear.  Eyes:     General: No scleral icterus.    Extraocular Movements: Extraocular movements intact.     Pupils: Pupils are equal, round, and reactive to light.  Cardiovascular:     Rate and Rhythm: Normal rate and regular rhythm.     Heart sounds: Normal heart sounds. No murmur. No friction rub. No gallop.   Pulmonary:     Effort: Pulmonary effort is normal. No respiratory distress.     Breath sounds: Normal breath sounds. No stridor. No wheezing, rhonchi or rales.  Abdominal:     General: Bowel sounds are normal. There is no distension.     Palpations: Abdomen is soft. There is no mass.     Tenderness: There is no abdominal tenderness. There is no right CVA tenderness, left CVA tenderness, guarding or rebound.  Skin:    General: Skin is warm and dry.     Coloration: Skin is not pale.  Findings: No rash.  Neurological:     General: No focal deficit present.     Mental Status: She is alert and oriented to person, place, and time.  Psychiatric:        Mood and Affect: Mood normal.        Behavior: Behavior normal.        Thought Content: Thought content normal.        Judgment: Judgment normal.    Results for orders placed or performed during the hospital encounter of 09/23/18 (from the past 24 hour(s))  POCT urinalysis dip (device)     Status: Abnormal   Collection Time: 09/23/18  1:42 PM  Result Value Ref Range   Glucose, UA NEGATIVE NEGATIVE mg/dL   Bilirubin Urine SMALL (A) NEGATIVE   Ketones, ur 15 (A) NEGATIVE mg/dL   Specific Gravity, Urine 1.025  1.005 - 1.030   Hgb urine dipstick MODERATE (A) NEGATIVE   pH 6.0 5.0 - 8.0   Protein, ur 30 (A) NEGATIVE mg/dL   Urobilinogen, UA 2.0 (H) 0.0 - 1.0 mg/dL   Nitrite NEGATIVE NEGATIVE   Leukocytes,Ua LARGE (A) NEGATIVE  Pregnancy, urine POC     Status: None   Collection Time: 09/23/18  1:54 PM  Result Value Ref Range   Preg Test, Ur NEGATIVE NEGATIVE    Assessment and Plan :   1. Urinary frequency   2. History of renal stone   3. Urinary urgency     Recommended patient start hydrating well with water and cut out alcohol.  Discussed possibility of STI, patient declined testing today.  Urine culture is pending.  Provided patient with information to [facility].  Will hold off on any antibiotic use for the moment. Counseled patient on potential for adverse effects with medications prescribed/recommended today, ER and return-to-clinic precautions discussed, patient verbalized understanding.    Jaynee Eagles, PA-C 09/23/18 1427

## 2018-09-24 LAB — URINE CULTURE

## 2018-09-29 ENCOUNTER — Ambulatory Visit (HOSPITAL_COMMUNITY)
Admission: EM | Admit: 2018-09-29 | Discharge: 2018-09-29 | Disposition: A | Discharge status: Home / Self Care | Payer: Medicaid Other | Attending: Emergency Medicine | Admitting: Emergency Medicine

## 2018-09-29 ENCOUNTER — Other Ambulatory Visit: Payer: Self-pay | Admitting: Emergency Medicine

## 2018-09-29 ENCOUNTER — Encounter (HOSPITAL_COMMUNITY): Payer: Self-pay

## 2018-09-29 ENCOUNTER — Other Ambulatory Visit: Payer: Self-pay

## 2018-09-29 DIAGNOSIS — N898 Other specified noninflammatory disorders of vagina: Secondary | ICD-10-CM | POA: Diagnosis not present

## 2018-09-29 DIAGNOSIS — R350 Frequency of micturition: Secondary | ICD-10-CM | POA: Diagnosis not present

## 2018-09-29 LAB — POCT URINALYSIS DIP (DEVICE)
Bilirubin Urine: NEGATIVE
Glucose, UA: NEGATIVE mg/dL
Hgb urine dipstick: NEGATIVE
Ketones, ur: NEGATIVE mg/dL
Leukocytes,Ua: NEGATIVE
Nitrite: NEGATIVE
Protein, ur: NEGATIVE mg/dL
Specific Gravity, Urine: 1.025 (ref 1.005–1.030)
Urobilinogen, UA: 1 mg/dL (ref 0.0–1.0)
pH: 6.5 (ref 5.0–8.0)

## 2018-09-29 MED ORDER — FLUCONAZOLE 150 MG PO TABS: 150.0000 mg | ORAL_TABLET | Freq: Every day | ORAL | 0 refills | Status: DC

## 2018-09-29 NOTE — ED Provider Notes (Signed)
[facility]    CSN: [id number] Arrival date & time: 09/29/18  1401      History   Chief Complaint Chief Complaint  Patient presents with  . Vaginal Itching  . Urinary Frequency    HPI Carmen Johnston is a 38 y.o. female.   Patient presents with vaginal itching and urinary frequency x2 weeks.  She states she was having white vaginal discharge but treated it with over-the-counter yeast medication which resolved.  She was seen here on 09/15/2018 and diagnosed with a UTI; treated with Macrobid.  She was seen here again on 09/23/2018; no treatment given as patient refused STD testing.  Urine culture ordered on 09/23/2018 was contaminated.  Patient denies fever, chills, abdominal pain, dysuria, pelvic pain, current vaginal discharge, back pain, or other symptoms.     Past Medical History:  Diagnosis Date  . Abnormal Pap smear   . Anemia   . Chlamydia   . Kidney stone   . Pregnancy induced hypertension   . Trichomonas   . Urinary tract infection     There are no active problems to display for this patient.   Past Surgical History:  Procedure Laterality Date  . COLPOSCOPY    . INDUCED ABORTION      OB History    Gravida  8   Para  4   Term  4   Preterm  0   AB  3   Living  4     SAB  1   TAB  1   Ectopic  1   Multiple      Live Births  4            Home Medications    Prior to Admission medications   Medication Sig Start Date End Date Taking? Authorizing Provider  fluconazole (DIFLUCAN) 150 MG tablet Take 1 tablet (150 mg total) by mouth daily. Take one tablet today.  May repeat in 3 days. 09/29/18   Mickie Bailate, Aerin Delany H, NP  Multiple Vitamins-Minerals (MULTIVITAMIN WITH MINERALS) tablet Take 1 tablet by mouth daily.    [provider]  nitrofurantoin, macrocrystal-monohydrate, (MACROBID) 100 MG capsule Take 1 capsule (100 mg total) by mouth 2 (two) times daily. 09/15/18   Frederica KusterMiller, Stephen M, MD  omeprazole (PRILOSEC) 40 MG capsule Take 1  capsule (40 mg total) by mouth daily. 04/16/18   Charlie Pitteranis, Henry L III, MD  polyethylene glycol (MIRALAX / GLYCOLAX) 17 g packet Take 17 g by mouth daily. Start with half capful daily and adjust as needed 04/16/18   Sherrilyn Ristanis, Henry L III, MD    Family History Family History  Problem Relation Age of Onset  . Mental illness Mother   . Anesthesia problems Neg Hx   . Stomach cancer Neg Hx   . Colon cancer Neg Hx   . Pancreatic cancer Neg Hx     Social History Social History   Tobacco Use  . Smoking status: Former Smoker    Packs/day: 0.25    Years: 0.00    Pack years: 0.00    Types: Cigarettes    Quit date: 01/04/2017    Years since quitting: 1.7  . Smokeless tobacco: Never Used  Substance Use Topics  . Alcohol use: Yes    Comment: occ  . Drug use: No     Allergies   Patient has no known allergies.   Review of Systems Review of Systems  Constitutional: Negative for chills and fever.  HENT: Negative for ear  pain and sore throat.   Eyes: Negative for pain and visual disturbance.  Respiratory: Negative for cough and shortness of breath.   Cardiovascular: Negative for chest pain and palpitations.  Gastrointestinal: Negative for abdominal pain and vomiting.  Genitourinary: Positive for frequency. Negative for dysuria, flank pain, hematuria, pelvic pain and vaginal discharge.  Musculoskeletal: Negative for arthralgias and back pain.  Skin: Negative for color change and rash.  Neurological: Negative for seizures and syncope.  All other systems reviewed and are negative.    Physical Exam Triage Vital Signs ED Triage Vitals [09/29/18 1447]  Enc Vitals Group     BP 117/87     Pulse Rate 69     Resp 16     Temp 98 F (36.7 C)     Temp Source Skin     SpO2 100 %     Weight      Height      Head Circumference      Peak Flow      Pain Score 0     Pain Loc      Pain Edu?      Excl. in GC?    No data found.  Updated Vital Signs BP 117/87 (BP Location: Right Arm)   Pulse  69   Temp 98 F (36.7 C) (Skin)   Resp 16   LMP 09/27/2018   SpO2 100%   Visual Acuity Right Eye Distance:   Left Eye Distance:   Bilateral Distance:    Right Eye Near:   Left Eye Near:    Bilateral Near:     Physical Exam Vitals signs and nursing note reviewed.  Constitutional:      General: She is not in acute distress.    Appearance: She is well-developed.  HENT:     Head: Normocephalic and atraumatic.  Eyes:     Conjunctiva/sclera: Conjunctivae normal.  Neck:     Musculoskeletal: Neck supple.  Cardiovascular:     Rate and Rhythm: Normal rate and regular rhythm.     Heart sounds: No murmur.  Pulmonary:     Effort: Pulmonary effort is normal. No respiratory distress.     Breath sounds: Normal breath sounds.  Abdominal:     General: Bowel sounds are normal.     Palpations: Abdomen is soft.     Tenderness: There is no abdominal tenderness. There is no right CVA tenderness, left CVA tenderness, guarding or rebound.  Skin:    General: Skin is warm and dry.     Findings: No rash.  Neurological:     Mental Status: She is alert.      UC Treatments / Results  Labs (all labs ordered are listed, but only abnormal results are displayed) Labs Reviewed  POCT URINALYSIS DIP (DEVICE)  CERVICOVAGINAL ANCILLARY ONLY    EKG   Radiology No results found.  Procedures Procedures (including critical care time)  Medications Ordered in UC Medications - No data to display  Initial Impression / Assessment and Plan / UC Course  I have reviewed the triage vital signs and the nursing notes.  Pertinent labs & imaging results that were available during my care of the patient were reviewed by me and considered in my medical decision making (see chart for details).   Vaginal irritation, urinary frequency.  Urine dip negative.  Vaginal swab for STDs obtained.  Treating vaginal irritation with Diflucan as her symptoms are consistent with Candida.  Discussed with patient that we  will call her  if her STD tests are positive and that she and her partner may require treatment at that time.  Instructed her to refrain from sex until her test results are back.  Patient agrees to plan of care.   Final Clinical Impressions(s) / UC Diagnoses   Final diagnoses:  Vaginal irritation  Urinary frequency     Discharge Instructions     Take the Diflucan as prescribed.    Your urine does not show signs of infection today.    Your STD tests are pending.  We will call you if they are positive.  You and your partner may require treatment at that time.  Do not have sex until your test results are back.        ED Prescriptions    Medication Sig Dispense Auth. Provider   fluconazole (DIFLUCAN) 150 MG tablet Take 1 tablet (150 mg total) by mouth daily. Take one tablet today.  May repeat in 3 days. 2 tablet Sharion Balloon, NP     PDMP not reviewed this encounter.   Sharion Balloon, NP 09/29/18 [phone]

## 2018-09-29 NOTE — ED Triage Notes (Signed)
Pt is here to follow up on her previous visit on 09/23/18. Pt is still going to the bathroom more frequently and she is having a vaginal discharge with some itching.

## 2018-09-29 NOTE — Discharge Instructions (Signed)
Take the Diflucan as prescribed.    Your urine does not show signs of infection today.    Your STD tests are pending.  We will call you if they are positive.  You and your partner may require treatment at that time.  Do not have sex until your test results are back.

## 2018-09-30 ENCOUNTER — Telehealth (HOSPITAL_COMMUNITY): Payer: Self-pay | Admitting: Emergency Medicine

## 2018-09-30 LAB — CERVICOVAGINAL ANCILLARY ONLY
Bacterial Vaginitis (gardnerella): POSITIVE — AB
Candida Glabrata: NEGATIVE
Candida Vaginitis: NEGATIVE
Molecular Disclaimer: NEGATIVE
Molecular Disclaimer: NEGATIVE
Molecular Disclaimer: NEGATIVE
Molecular Disclaimer: NORMAL
Trichomonas: NEGATIVE

## 2018-09-30 MED ORDER — METRONIDAZOLE 500 MG PO TABS: 500.0000 mg | ORAL_TABLET | Freq: Two times a day (BID) | ORAL | 0 refills | Status: AC

## 2018-09-30 NOTE — Telephone Encounter (Signed)
Bacterial vaginosis is positive. This was not treated at the urgent care visit.  Flagyl 500 mg BID x 7 days #14 no refills sent to patients pharmacy of choice.    Pending G/C  Patient contacted and made aware of    results, all questions answered   

## 2018-10-01 LAB — CERVICOVAGINAL ANCILLARY ONLY
Chlamydia: NEGATIVE
Neisseria Gonorrhea: NEGATIVE

## 2018-11-26 ENCOUNTER — Ambulatory Visit (HOSPITAL_COMMUNITY)
Admission: EM | Admit: 2018-11-26 | Discharge: 2018-11-26 | Disposition: A | Discharge status: Home / Self Care | Payer: Medicaid Other | Attending: Family Medicine | Admitting: Family Medicine

## 2018-11-26 ENCOUNTER — Encounter (HOSPITAL_COMMUNITY): Payer: Self-pay | Admitting: Emergency Medicine

## 2018-11-26 ENCOUNTER — Other Ambulatory Visit: Payer: Self-pay

## 2018-11-26 DIAGNOSIS — Z3202 Encounter for pregnancy test, result negative: Secondary | ICD-10-CM | POA: Diagnosis not present

## 2018-11-26 DIAGNOSIS — K5900 Constipation, unspecified: Secondary | ICD-10-CM

## 2018-11-26 DIAGNOSIS — R1084 Generalized abdominal pain: Secondary | ICD-10-CM

## 2018-11-26 LAB — POCT URINALYSIS DIP (DEVICE)
Bilirubin Urine: NEGATIVE
Glucose, UA: NEGATIVE mg/dL
Hgb urine dipstick: NEGATIVE
Ketones, ur: NEGATIVE mg/dL
Leukocytes,Ua: NEGATIVE
Nitrite: NEGATIVE
Protein, ur: NEGATIVE mg/dL
Specific Gravity, Urine: 1.025 (ref 1.005–1.030)
Urobilinogen, UA: 1 mg/dL (ref 0.0–1.0)
pH: 6.5 (ref 5.0–8.0)

## 2018-11-26 LAB — POCT PREGNANCY, URINE: Preg Test, Ur: NEGATIVE

## 2018-11-26 LAB — POC URINE PREG, ED: Preg Test, Ur: NEGATIVE

## 2018-11-26 MED ORDER — LIDOCAINE VISCOUS HCL 2 % MT SOLN
OROMUCOSAL | Status: AC
  Filled 2018-11-26: qty 15

## 2018-11-26 MED ORDER — ALUM & MAG HYDROXIDE-SIMETH 200-200-20 MG/5ML PO SUSP
ORAL | Status: AC
  Filled 2018-11-26: qty 30

## 2018-11-26 MED ORDER — LIDOCAINE VISCOUS HCL 2 % MT SOLN
15.0000 mL | Freq: Once | OROMUCOSAL | Status: AC
  Administered 2018-11-26: 19:00:00 15 mL via ORAL

## 2018-11-26 MED ORDER — ALUM & MAG HYDROXIDE-SIMETH 200-200-20 MG/5ML PO SUSP
30.0000 mL | Freq: Once | ORAL | Status: AC
  Administered 2018-11-26: 19:00:00 30 mL via ORAL

## 2018-11-26 MED ORDER — DOCUSATE SODIUM 100 MG PO CAPS: 100.0000 mg | ORAL_CAPSULE | Freq: Two times a day (BID) | ORAL | 0 refills | Status: DC

## 2018-11-26 MED ORDER — POLYETHYLENE GLYCOL 3350 17 G PO PACK: 17.0000 g | PACK | Freq: Every day | ORAL | 0 refills | Status: DC

## 2018-11-26 MED ORDER — ALUM & MAG HYDROXIDE-SIMETH 400-400-40 MG/5ML PO SUSP: 10.0000 mL | Freq: Four times a day (QID) | ORAL | 0 refills | Status: DC | PRN

## 2018-11-26 NOTE — Discharge Instructions (Signed)
Reflux Please continue taking daily omeprazole/Prilosec, supplement with Maalox as needed for abdominal discomfort and any reflux/acid symptoms Avoid trigger foods for reflux-see attached  Constipation Please use Miralax for moderate to severe constipation. Take this once a day for the next 2-3 days. Please also start docusate stool softener, twice a day for at least 1 week. If stools become loose, cut down to once a day for another week. If stools remain loose, cut back to 1 pill every other day for a third week. You can stop docusate thereafter and resume as needed for constipation.  If bowels still not moving may try over-the-counter magnesium citrate  To help reduce constipation and promote bowel health: 1. Drink at least 64 ounces of water each day 2. Eat plenty of fiber (fruits, vegetables, whole grains, legumes) 3. Be physically active or exercise including walking, jogging, swimming, yoga, etc. 4. For active constipation use a stool softener (docusate) or an osmotic laxative (like Miralax) each day, or as needed.

## 2018-11-26 NOTE — ED Provider Notes (Signed)
[facility]    CSN: [id number] Arrival date & time: 11/26/18  1721      History   Chief Complaint Chief Complaint  Patient presents with  . Abdominal Pain    HPI Carmen Johnston is a 38 y.o. female no significant past medical history presenting today for evaluation of abdominal pain.  Patient states that over the past 2 days she has had a cramping sensation in her abdomen that has been intermittent.  Feels very tight pressure and gassy sensation.  She feels as if she has been having a harder time passing her stools of recently.  Typically has daily bowel movements, has not had a bowel movement the past couple days.  She is unsure if this could be related to recent increase in alcohol use.  She does note she has a history of reflux and is on daily omeprazole.  She has had some mild nausea.  Denies vomiting.  Denies pain localized to 1 specific area.  Has had some urinary frequency, but denies dysuria.  Denies abnormal vaginal discharge.  She denies any chest pain or shortness of breath.  She has tried taking Excedrin and Tums without relief.  Denies previous abdominal surgeries.  Patient is also requesting neurology referral for frequent twitching under her left eye as well as intermittent numbness extending into left leg.  States that these have been going on for multiple months.  She does not have a PCP.  HPI  Past Medical History:  Diagnosis Date  . Abnormal Pap smear   . Anemia   . Chlamydia   . Kidney stone   . Pregnancy induced hypertension   . Trichomonas   . Urinary tract infection     There are no active problems to display for this patient.   Past Surgical History:  Procedure Laterality Date  . COLPOSCOPY    . INDUCED ABORTION      OB History    Gravida  8   Para  4   Term  4   Preterm  0   AB  3   Living  4     SAB  1   TAB  1   Ectopic  1   Multiple      Live Births  4            Home Medications    Prior to Admission  medications   Medication Sig Start Date End Date Taking? Authorizing Provider  Aspirin-Acetaminophen-Caffeine (EXCEDRIN PO) Take by mouth.   Yes [provider]  Calcium Carbonate Antacid (TUMS E-X PO) Take by mouth.   Yes [provider]  alum & mag hydroxide-simeth (MAALOX PLUS) 400-400-40 MG/5ML suspension Take 10 mLs by mouth every 6 (six) hours as needed for indigestion. 11/26/18   Kemani Demarais C, PA-C  docusate sodium (COLACE) 100 MG capsule Take 1 capsule (100 mg total) by mouth every 12 (twelve) hours. 11/26/18   Shamiracle Gorden C, PA-C  polyethylene glycol (MIRALAX / GLYCOLAX) 17 g packet Take 17 g by mouth daily. 11/26/18   Witt Plitt C, PA-C  omeprazole (PRILOSEC) 40 MG capsule Take 1 capsule (40 mg total) by mouth daily. 04/16/18 11/26/18  Doran Stabler, MD    Family History Family History  Problem Relation Age of Onset  . Mental illness Mother   . Anesthesia problems Neg Hx   . Stomach cancer Neg Hx   . Colon cancer Neg Hx   . Pancreatic cancer Neg Hx  Social History Social History   Tobacco Use  . Smoking status: Former Smoker    Packs/day: 0.25    Years: 0.00    Pack years: 0.00    Types: Cigarettes    Quit date: 01/04/2017    Years since quitting: 1.8  . Smokeless tobacco: Never Used  Substance Use Topics  . Alcohol use: Yes    Comment: occ  . Drug use: No     Allergies   Patient has no known allergies.   Review of Systems Review of Systems  Constitutional: Negative for fever.  Respiratory: Negative for shortness of breath.   Cardiovascular: Negative for chest pain.  Gastrointestinal: Positive for abdominal pain, constipation and nausea. Negative for diarrhea and vomiting.  Genitourinary: Positive for frequency. Negative for dysuria, flank pain, genital sores, hematuria, menstrual problem, vaginal bleeding, vaginal discharge and vaginal pain.  Musculoskeletal: Negative for back pain.  Skin: Negative for rash.   Neurological: Negative for dizziness, light-headedness and headaches.     Physical Exam Triage Vital Signs ED Triage Vitals  Enc Vitals Group     BP 11/26/18 1813 (!) 130/91     Pulse Rate 11/26/18 1813 79     Resp 11/26/18 1813 18     Temp 11/26/18 1813 98.3 F (36.8 C)     Temp src --      SpO2 11/26/18 1813 98 %     Weight --      Height --      Head Circumference --      Peak Flow --      Pain Score 11/26/18 1809 7     Pain Loc --      Pain Edu? --      Excl. in GC? --    No data found.  Updated Vital Signs BP (!) 130/91   Pulse 79   Temp 98.3 F (36.8 C)   Resp 18   LMP 11/03/2018   SpO2 98%   Visual Acuity Right Eye Distance:   Left Eye Distance:   Bilateral Distance:    Right Eye Near:   Left Eye Near:    Bilateral Near:     Physical Exam Vitals signs and nursing note reviewed.  Constitutional:      General: She is not in acute distress.    Appearance: She is well-developed.  HENT:     Head: Normocephalic and atraumatic.     Mouth/Throat:     Comments: Oral mucosa pink and moist, no tonsillar enlargement or exudate. Posterior pharynx patent and nonerythematous, no uvula deviation or swelling. Normal phonation. Eyes:     Conjunctiva/sclera: Conjunctivae normal.  Neck:     Musculoskeletal: Neck supple.  Cardiovascular:     Rate and Rhythm: Normal rate and regular rhythm.     Heart sounds: No murmur.  Pulmonary:     Effort: Pulmonary effort is normal. No respiratory distress.     Breath sounds: Normal breath sounds.     Comments: Breathing comfortably at rest, CTABL, no wheezing, rales or other adventitious sounds auscultated Abdominal:     Palpations: Abdomen is soft.     Tenderness: There is abdominal tenderness.     Comments: Soft, nondistended, generalized tenderness throughout entire abdomen, no focal tenderness, negative rebound, negative Rovsing, negative McBurney's, negative Murphy's  Skin:    General: Skin is warm and dry.   Neurological:     General: No focal deficit present.     Mental Status: She is alert and oriented to  person, place, and time. Mental status is at baseline.     Cranial Nerves: No cranial nerve deficit.     Motor: No weakness.     Gait: Gait normal.      UC Treatments / Results  Labs (all labs ordered are listed, but only abnormal results are displayed) Labs Reviewed  POCT URINALYSIS DIP (DEVICE)  POCT PREGNANCY, URINE  POC URINE PREG, ED    EKG   Radiology No results found.  Procedures Procedures (including critical care time)  Medications Ordered in UC Medications  alum & mag hydroxide-simeth (MAALOX/MYLANTA) 200-200-20 MG/5ML suspension 30 mL (30 mLs Oral Given 11/26/18 1856)    And  lidocaine (XYLOCAINE) 2 % viscous mouth solution 15 mL (15 mLs Oral Given 11/26/18 1856)  alum & mag hydroxide-simeth (MAALOX/MYLANTA) 200-200-20 MG/5ML suspension (has no administration in time range)  lidocaine (XYLOCAINE) 2 % viscous mouth solution (has no administration in time range)    Initial Impression / Assessment and Plan / UC Course  I have reviewed the triage vital signs and the nursing notes.  Pertinent labs & imaging results that were available during my care of the patient were reviewed by me and considered in my medical decision making (see chart for details).     Patient provided with GI cocktail which did improve some of her discomfort.  May have some mild gastritis from increased alcohol use of recently.  Will have supplement with Maalox along with continuing to take daily Prilosec for symptoms.  Also has had decreased bowel movements, will have initiate MiraLAX/Colace daily for the next few days to week to help with stools.  Discussed lifestyle modifications.  Do not suspect abdominal emergency at this time, continue to monitor.Discussed strict return precautions. Patient verbalized understanding and is agreeable with plan.  Provided contact for PCP to establish care  as well as for her neurology referrals.  Final Clinical Impressions(s) / UC Diagnoses   Final diagnoses:  Generalized abdominal pain  Constipation, unspecified constipation type     Discharge Instructions     Reflux Please continue taking daily omeprazole/Prilosec, supplement with Maalox as needed for abdominal discomfort and any reflux/acid symptoms Avoid trigger foods for reflux-see attached  Constipation Please use Miralax for moderate to severe constipation. Take this once a day for the next 2-3 days. Please also start docusate stool softener, twice a day for at least 1 week. If stools become loose, cut down to once a day for another week. If stools remain loose, cut back to 1 pill every other day for a third week. You can stop docusate thereafter and resume as needed for constipation.  If bowels still not moving may try over-the-counter magnesium citrate  To help reduce constipation and promote bowel health: 1. Drink at least 64 ounces of water each day 2. Eat plenty of fiber (fruits, vegetables, whole grains, legumes) 3. Be physically active or exercise including walking, jogging, swimming, yoga, etc. 4. For active constipation use a stool softener (docusate) or an osmotic laxative (like Miralax) each day, or as needed.   ED Prescriptions    Medication Sig Dispense Auth. Provider   alum & mag hydroxide-simeth (MAALOX PLUS) 400-400-40 MG/5ML suspension Take 10 mLs by mouth every 6 (six) hours as needed for indigestion. 355 mL Chick Cousins C, PA-C   polyethylene glycol (MIRALAX / GLYCOLAX) 17 g packet Take 17 g by mouth daily. 14 each Jazmyn Offner C, PA-C   docusate sodium (COLACE) 100 MG capsule Take 1 capsule (100 mg  total) by mouth every 12 (twelve) hours. 20 capsule Muaaz Brau, PeculiarHallie C, PA-C     PDMP not reviewed this encounter.   Lew DawesWieters, Ermalee Mealy C, [place] JerseyPA-C 11/26/18 2047

## 2018-11-26 NOTE — ED Triage Notes (Signed)
Pain under right rib cage since yesterday morning.  Patient has had nausea, no vomiting.  Last bm was 3 days ago, normally goes every day.    Patient is also concerned for left eye twitching

## 2019-01-19 DIAGNOSIS — Z1152 Encounter for screening for COVID-19: Secondary | ICD-10-CM | POA: Diagnosis not present

## 2019-02-11 ENCOUNTER — Ambulatory Visit (HOSPITAL_COMMUNITY)
Admission: EM | Admit: 2019-02-11 | Discharge: 2019-02-11 | Disposition: A | Discharge status: Home / Self Care | Payer: Medicaid Other | Attending: Family Medicine | Admitting: Family Medicine

## 2019-02-11 ENCOUNTER — Other Ambulatory Visit: Payer: Self-pay

## 2019-02-11 ENCOUNTER — Encounter (HOSPITAL_COMMUNITY): Payer: Self-pay

## 2019-02-11 DIAGNOSIS — Z3201 Encounter for pregnancy test, result positive: Secondary | ICD-10-CM | POA: Diagnosis not present

## 2019-02-11 DIAGNOSIS — N760 Acute vaginitis: Secondary | ICD-10-CM | POA: Diagnosis not present

## 2019-02-11 LAB — POCT URINALYSIS DIP (DEVICE)
Bilirubin Urine: NEGATIVE
Glucose, UA: NEGATIVE mg/dL
Ketones, ur: NEGATIVE mg/dL
Nitrite: NEGATIVE
Protein, ur: NEGATIVE mg/dL
Specific Gravity, Urine: >=1.03 (ref 1.005–1.030)
Urobilinogen, UA: 0.2 mg/dL (ref 0.0–1.0)
pH: 5.5 (ref 5.0–8.0)

## 2019-02-11 LAB — POCT PREGNANCY, URINE: Preg Test, Ur: POSITIVE — AB

## 2019-02-11 LAB — POC URINE PREG, ED
Preg Test, Ur: POSITIVE — AB
Preg Test, Ur: POSITIVE — AB

## 2019-02-11 MED ORDER — METRONIDAZOLE 500 MG PO TABS: 500.0000 mg | ORAL_TABLET | Freq: Two times a day (BID) | ORAL | 0 refills | Status: AC

## 2019-02-11 MED ORDER — PRENATAL ADULT GUMMY/DHA/FA 0.4-25 MG PO CHEW: 1.0000 | CHEWABLE_TABLET | Freq: Every day | ORAL | 0 refills | Status: DC

## 2019-02-11 NOTE — ED Triage Notes (Signed)
Patient presents to Urgent Care with complaints of odd sensation with urination and vaginal odor since 3 days ago. Patient reports she thinks she has BV.

## 2019-02-11 NOTE — Discharge Instructions (Signed)
Pregnancy test was positive Please follow-up with OB/GYN/women's clinic Begin prenatal Vaginal swab pending to evaluate for any infections vaginally Urine culture pending May begin metronidazole twice daily for 1 week to treat for bacterial vaginosis.  If you develop any abdominal pain or bleeding please follow-up at [facility]

## 2019-02-11 NOTE — ED Provider Notes (Signed)
[facility]    CSN: [id number] Arrival date & time: 02/11/19  7106      History   Chief Complaint Chief Complaint  Patient presents with  . Vaginal Odor    HPI Carmen Johnston is a 39 y.o. female presenting today for evaluation of possible BV.  Patient states that over the past 2 to 3 days she has developed an irritation as well as associated odor.  Feels similar to prior BV infections.  Does note that she recently used a scented soap that may have triggered this.  She denies any new partners or any specific concerns for STDs.  Denies abdominal pain or pelvic pain.  Denies nausea or vomiting.  Denies fevers.  Last menstrual cycle was approximately 1 month ago, patient states that she is due to start her menstrual cycle soon.  She is not on any form of birth control.  HPI  Past Medical History:  Diagnosis Date  . Abnormal Pap smear   . Anemia   . Chlamydia   . Kidney stone   . Pregnancy induced hypertension   . Trichomonas   . Urinary tract infection     There are no problems to display for this patient.   Past Surgical History:  Procedure Laterality Date  . COLPOSCOPY    . INDUCED ABORTION      OB History    Gravida  8   Para  4   Term  4   Preterm  0   AB  3   Living  4     SAB  1   TAB  1   Ectopic  1   Multiple      Live Births  4            Home Medications    Prior to Admission medications   Medication Sig Start Date End Date Taking? Authorizing Provider  Aspirin-Acetaminophen-Caffeine (EXCEDRIN PO) Take by mouth.    [provider]  Calcium Carbonate Antacid (TUMS E-X PO) Take by mouth.    [provider]  metroNIDAZOLE (FLAGYL) 500 MG tablet Take 1 tablet (500 mg total) by mouth 2 (two) times daily for 7 days. 02/11/19 02/18/19  Serra Younan C, PA-C  polyethylene glycol (MIRALAX / GLYCOLAX) 17 g packet Take 17 g by mouth daily. 11/26/18   Carney Saxton C, PA-C  Prenatal MV & Min w/FA-DHA (PRENATAL ADULT  GUMMY/DHA/FA) 0.4-25 MG CHEW Chew 1 tablet by mouth daily. 02/11/19   Landis Cassaro C, PA-C  omeprazole (PRILOSEC) 40 MG capsule Take 1 capsule (40 mg total) by mouth daily. 04/16/18 11/26/18  Sherrilyn Rist, MD    Family History Family History  Problem Relation Age of Onset  . Mental illness Mother   . Anesthesia problems Neg Hx   . Stomach cancer Neg Hx   . Colon cancer Neg Hx   . Pancreatic cancer Neg Hx     Social History Social History   Tobacco Use  . Smoking status: Former Smoker    Packs/day: 0.25    Years: 0.00    Pack years: 0.00    Types: Cigarettes    Quit date: 01/04/2017    Years since quitting: 2.1  . Smokeless tobacco: Never Used  Substance Use Topics  . Alcohol use: Yes    Comment: occ  . Drug use: No     Allergies   Patient has no known allergies.   Review of Systems Review of Systems  Constitutional:  Negative for fever.  Respiratory: Negative for shortness of breath.   Cardiovascular: Negative for chest pain.  Gastrointestinal: Negative for abdominal pain, diarrhea, nausea and vomiting.  Genitourinary: Negative for dysuria, flank pain, genital sores, hematuria, menstrual problem, vaginal bleeding, vaginal discharge and vaginal pain.       Irritation, odor  Musculoskeletal: Negative for back pain.  Skin: Negative for rash.  Neurological: Negative for dizziness, light-headedness and headaches.     Physical Exam Triage Vital Signs ED Triage Vitals  Enc Vitals Group     BP 02/11/19 0936 (!) 137/97     Pulse Rate 02/11/19 0936 82     Resp 02/11/19 0936 16     Temp 02/11/19 0936 98.3 F (36.8 C)     Temp Source 02/11/19 0936 Oral     SpO2 02/11/19 0936 99 %     Weight --      Height --      Head Circumference --      Peak Flow --      Pain Score 02/11/19 0934 0     Pain Loc --      Pain Edu? --      Excl. in GC? --    No data found.  Updated Vital Signs BP (!) 137/97 (BP Location: Left Arm)   Pulse 82   Temp 98.3 F (36.8 C)  (Oral)   Resp 16   SpO2 99%   Visual Acuity Right Eye Distance:   Left Eye Distance:   Bilateral Distance:    Right Eye Near:   Left Eye Near:    Bilateral Near:     Physical Exam Vitals and nursing note reviewed.  Constitutional:      Appearance: She is well-developed.     Comments: No acute distress  HENT:     Head: Normocephalic and atraumatic.     Nose: Nose normal.  Eyes:     Conjunctiva/sclera: Conjunctivae normal.  Cardiovascular:     Rate and Rhythm: Normal rate.  Pulmonary:     Effort: Pulmonary effort is normal. No respiratory distress.  Abdominal:     General: There is no distension.     Comments: Soft, nondistended, nontender to light and deep palpation throughout abdomen  Musculoskeletal:        General: Normal range of motion.     Cervical back: Neck supple.  Skin:    General: Skin is warm and dry.  Neurological:     Mental Status: She is alert and oriented to person, place, and time.      UC Treatments / Results  Labs (all labs ordered are listed, but only abnormal results are displayed) Labs Reviewed  POCT URINALYSIS DIP (DEVICE) - Abnormal; Notable for the following components:      Result Value   Hgb urine dipstick SMALL (*)    Leukocytes,Ua SMALL (*)    All other components within normal limits  POC URINE PREG, ED - Abnormal; Notable for the following components:   Preg Test, Ur POSITIVE (*)    All other components within normal limits  POCT PREGNANCY, URINE - Abnormal; Notable for the following components:   Preg Test, Ur POSITIVE (*)    All other components within normal limits  POC URINE PREG, ED - Abnormal; Notable for the following components:   Preg Test, Ur POSITIVE (*)    All other components within normal limits  URINE CULTURE  CERVICOVAGINAL ANCILLARY ONLY    EKG   Radiology No results found.  Procedures Procedures (including critical care time)  Medications Ordered in UC Medications - No data to display  Initial  Impression / Assessment and Plan / UC Course  I have reviewed the triage vital signs and the nursing notes.  Pertinent labs & imaging results that were available during my care of the patient were reviewed by me and considered in my medical decision making (see chart for details).     Pregnancy test positive.  Will have initiate prenatal vitamin and follow-up with OB/GYN. Empirically treating for BV with Flagyl twice daily x1 week.  Vaginal swab pending to further evaluate for any infectious causes as well as urine culture.  Discussed strict return precautions. Patient verbalized understanding and is agreeable with plan.  Final Clinical Impressions(s) / UC Diagnoses   Final diagnoses:  Vaginitis and vulvovaginitis  Positive pregnancy test     Discharge Instructions     Pregnancy test was positive Please follow-up with OB/GYN/women's clinic Begin prenatal Vaginal swab pending to evaluate for any infections vaginally Urine culture pending May begin metronidazole twice daily for 1 week to treat for bacterial vaginosis.  If you develop any abdominal pain or bleeding please follow-up at [facility]     ED Prescriptions    Medication Sig Dispense Auth. Provider   metroNIDAZOLE (FLAGYL) 500 MG tablet Take 1 tablet (500 mg total) by mouth 2 (two) times daily for 7 days. 14 tablet Granville Whitefield C, PA-C   Prenatal MV & Min w/FA-DHA (PRENATAL ADULT GUMMY/DHA/FA) 0.4-25 MG CHEW Chew 1 tablet by mouth daily. 60 tablet Endora Teresi, [place] C, PA-C     PDMP not reviewed this encounter.   Duayne Brideau, [place] C, PA-C 02/11/19 1025

## 2019-02-12 LAB — URINE CULTURE

## 2019-02-14 ENCOUNTER — Telehealth (HOSPITAL_COMMUNITY): Payer: Self-pay | Admitting: Emergency Medicine

## 2019-02-14 LAB — CERVICOVAGINAL ANCILLARY ONLY
Bacterial vaginitis: POSITIVE — AB
Candida vaginitis: NEGATIVE
Chlamydia: NEGATIVE
Neisseria Gonorrhea: NEGATIVE
Trichomonas: POSITIVE — AB

## 2019-02-14 NOTE — Telephone Encounter (Signed)
Bacterial Vaginosis test is positive.  Prescription for metronidazole was given at the urgent care visit.   Trichomonas is positive. Rx metronidazole was given at the urgent care visit. Pt needs education to please refrain from sexual intercourse for 7 days to give the medicine time to work. Sexual partners need to be notified and tested/treated. Condoms may reduce risk of reinfection. Recheck for further evaluation if symptoms are not improving.   Patient contacted by phone and made aware of    results. Pt verbalized understanding and had all questions answered.

## 2019-03-02 DIAGNOSIS — H1013 Acute atopic conjunctivitis, bilateral: Secondary | ICD-10-CM | POA: Diagnosis not present

## 2019-03-13 ENCOUNTER — Ambulatory Visit: Payer: Medicaid Other | Admitting: Gastroenterology

## 2019-04-29 DIAGNOSIS — Z23 Encounter for immunization: Secondary | ICD-10-CM | POA: Diagnosis not present

## 2019-09-18 ENCOUNTER — Emergency Department (HOSPITAL_COMMUNITY)
Admission: EM | Admit: 2019-09-18 | Discharge: 2019-09-19 | Disposition: A | Discharge status: Home / Self Care | Payer: Medicaid Other | Attending: Emergency Medicine | Admitting: Emergency Medicine

## 2019-09-18 ENCOUNTER — Other Ambulatory Visit: Payer: Self-pay

## 2019-09-18 DIAGNOSIS — Z5321 Procedure and treatment not carried out due to patient leaving prior to being seen by health care provider: Secondary | ICD-10-CM | POA: Insufficient documentation

## 2019-09-18 DIAGNOSIS — M25511 Pain in right shoulder: Secondary | ICD-10-CM | POA: Insufficient documentation

## 2019-09-18 LAB — COMPREHENSIVE METABOLIC PANEL
ALT: 23 U/L (ref 0–44)
AST: 22 U/L (ref 15–41)
Albumin: 3.6 g/dL (ref 3.5–5.0)
Alkaline Phosphatase: 44 U/L (ref 38–126)
Anion gap: 9 (ref 5–15)
BUN: 7 mg/dL (ref 6–20)
CO2: 26 mmol/L (ref 22–32)
Calcium: 9.4 mg/dL (ref 8.9–10.3)
Chloride: 104 mmol/L (ref 98–111)
Creatinine, Ser: 0.98 mg/dL (ref 0.44–1.00)
GFR calc Af Amer: >60 mL/min (ref >60)
GFR calc non Af Amer: >60 mL/min (ref >60)
Glucose, Bld: 88 mg/dL (ref 70–99)
Potassium: 4.1 mmol/L (ref 3.5–5.1)
Sodium: 139 mmol/L (ref 135–145)
Total Bilirubin: 1.5 mg/dL — ABNORMAL HIGH (ref 0.3–1.2)
Total Protein: 6.9 g/dL (ref 6.5–8.1)

## 2019-09-18 LAB — CBC
HCT: 34.1 % — ABNORMAL LOW (ref 36.0–46.0)
Hemoglobin: 10 g/dL — ABNORMAL LOW (ref 12.0–15.0)
MCH: 23.9 pg — ABNORMAL LOW (ref 26.0–34.0)
MCHC: 29.3 g/dL — ABNORMAL LOW (ref 30.0–36.0)
MCV: 81.4 fL (ref 80.0–100.0)
Platelets: 318 10*3/uL (ref 150–400)
RBC: 4.19 MIL/uL (ref 3.87–5.11)
RDW: 14.9 % (ref 11.5–15.5)
WBC: 5.6 10*3/uL (ref 4.0–10.5)
nRBC: 0 % (ref 0.0–0.2)

## 2019-09-18 LAB — I-STAT BETA HCG BLOOD, ED (MC, WL, AP ONLY): I-stat hCG, quantitative: <5 m[IU]/mL (ref <5)

## 2019-09-18 LAB — LIPASE, BLOOD: Lipase: 42 U/L (ref 11–51)

## 2019-09-18 MED ORDER — ALUM & MAG HYDROXIDE-SIMETH 200-200-20 MG/5ML PO SUSP
30.0000 mL | Freq: Once | ORAL | Status: AC
  Administered 2019-09-18: 30 mL via ORAL
  Filled 2019-09-18: qty 30

## 2019-09-18 MED ORDER — LIDOCAINE VISCOUS HCL 2 % MT SOLN
15.0000 mL | Freq: Once | OROMUCOSAL | Status: AC
  Administered 2019-09-18: 15 mL via ORAL
  Filled 2019-09-18: qty 15

## 2019-09-18 NOTE — ED Notes (Signed)
Called for triage and unable to locate in lobby  

## 2019-09-18 NOTE — ED Notes (Signed)
Pt called x 3  No answer. 

## 2019-09-18 NOTE — ED Triage Notes (Signed)
Emergency Medicine Provider Triage Evaluation Note  Carmen Johnston , a 39 y.o. female  was evaluated in triage.  Pt complains of 2 days of reflux symptoms.  She has associated waterbrash and belching.  She states that she feels some discomfort in her right shoulder.  She denies abdominal pain or vomiting.  She denies cough or shortness of breath.  She has a history of reflux and ate spicy food yesterday.  Review of Systems  Positive: Reflux symptoms Negative: Chest pain shortness of breath, abdominal pain nausea or vomiting  Physical Exam  BP (!) 147/101   Pulse 80   Temp 99.2 F (37.3 C) (Oral)   Resp 18   Ht 5\' 7"  (1.702 m)   Wt 86.2 kg   SpO2 99%   BMI 29.76 kg/m  Gen:   Awake, no distress   HEENT:  Atraumatic  Resp:  Normal effort  Cardiac:  Normal rate  Abd:   Nondistended, nontender  MSK:   Moves extremities without difficulty  Neuro:  Speech clear   Medical Decision Making  Medically screening exam initiated at 1:57 PM.  Appropriate orders placed.  Carmen Johnston was informed that the remainder of the evaluation will be completed by another provider, this initial triage assessment does not replace that evaluation, and the importance of remaining in the ED until their evaluation is complete.  Clinical Impression  Patient here with complaint of reflux symptoms.  She was given a GI cocktail here in the emergency department.  I have ordered belly labs, pregnancy test.  Have low suspicion for ACS.  She is not tender in the right upper quadrant and I have low suspicion for acute cholecystitis.   Luther Parody, PA-C 09/18/19 1416

## 2019-09-18 NOTE — ED Triage Notes (Signed)
Pt here with c/o acid reflux after a spicy meal yesterday took some of her  reflux meds without relief

## 2019-09-25 ENCOUNTER — Encounter (HOSPITAL_COMMUNITY): Payer: Self-pay | Admitting: Emergency Medicine

## 2019-09-25 ENCOUNTER — Ambulatory Visit (HOSPITAL_COMMUNITY)
Admission: EM | Admit: 2019-09-25 | Discharge: 2019-09-25 | Disposition: A | Discharge status: Home / Self Care | Payer: Medicaid Other | Attending: Family Medicine | Admitting: Family Medicine

## 2019-09-25 ENCOUNTER — Other Ambulatory Visit: Payer: Self-pay

## 2019-09-25 DIAGNOSIS — Z3202 Encounter for pregnancy test, result negative: Secondary | ICD-10-CM

## 2019-09-25 DIAGNOSIS — Z20822 Contact with and (suspected) exposure to covid-19: Secondary | ICD-10-CM | POA: Diagnosis not present

## 2019-09-25 DIAGNOSIS — Z87440 Personal history of urinary (tract) infections: Secondary | ICD-10-CM | POA: Insufficient documentation

## 2019-09-25 DIAGNOSIS — N390 Urinary tract infection, site not specified: Secondary | ICD-10-CM | POA: Diagnosis not present

## 2019-09-25 DIAGNOSIS — N898 Other specified noninflammatory disorders of vagina: Secondary | ICD-10-CM | POA: Insufficient documentation

## 2019-09-25 DIAGNOSIS — Z87891 Personal history of nicotine dependence: Secondary | ICD-10-CM | POA: Insufficient documentation

## 2019-09-25 DIAGNOSIS — Z79899 Other long term (current) drug therapy: Secondary | ICD-10-CM | POA: Insufficient documentation

## 2019-09-25 LAB — POCT URINALYSIS DIPSTICK, ED / UC
Bilirubin Urine: NEGATIVE
Glucose, UA: NEGATIVE mg/dL
Ketones, ur: NEGATIVE mg/dL
Leukocytes,Ua: NEGATIVE
Nitrite: NEGATIVE
Protein, ur: NEGATIVE mg/dL
Specific Gravity, Urine: >=1.03 (ref 1.005–1.030)
Urobilinogen, UA: 0.2 mg/dL (ref 0.0–1.0)
pH: 5.5 (ref 5.0–8.0)

## 2019-09-25 LAB — SARS CORONAVIRUS 2 (TAT 6-24 HRS): SARS Coronavirus 2: NEGATIVE

## 2019-09-25 LAB — POC URINE PREG, ED: Preg Test, Ur: NEGATIVE

## 2019-09-25 MED ORDER — METRONIDAZOLE 500 MG PO TABS: 500.0000 mg | ORAL_TABLET | Freq: Two times a day (BID) | ORAL | 0 refills | Status: DC

## 2019-09-25 NOTE — Discharge Instructions (Addendum)
Medicine as prescribed to treat BV Follow up as needed for continued or worsening symptoms

## 2019-09-25 NOTE — ED Provider Notes (Signed)
[facility]    CSN: [id number] Arrival date & time: 09/25/19  1147      History   Chief Complaint Chief Complaint  Patient presents with  . Urinary Tract Infection    HPI TAKEILA THAYNE is a 39 y.o. female.   Patient is a 39 year old female past medical history of anemia, chlamydia, kidney stone, trichomonas, UTIs.  She presents today for vaginal odor, urinary frequency for approximate 2 days ago started her menstrual cycle on 01/25/2019.  Denies any specific discharge, dysuria, abdominal pain, back pain or fevers.     Past Medical History:  Diagnosis Date  . Abnormal Pap smear   . Anemia   . Chlamydia   . Kidney stone   . Pregnancy induced hypertension   . Trichomonas   . Urinary tract infection     There are no problems to display for this patient.   Past Surgical History:  Procedure Laterality Date  . COLPOSCOPY    . INDUCED ABORTION      OB History    Gravida  8   Para  4   Term  4   Preterm  0   AB  3   Living  4     SAB  1   TAB  1   Ectopic  1   Multiple      Live Births  4            Home Medications    Prior to Admission medications   Medication Sig Start Date End Date Taking? Authorizing Provider  Aspirin-Acetaminophen-Caffeine (EXCEDRIN PO) Take by mouth.    [provider]  Calcium Carbonate Antacid (TUMS E-X PO) Take by mouth.    [provider]  metroNIDAZOLE (FLAGYL) 500 MG tablet Take 1 tablet (500 mg total) by mouth 2 (two) times daily. 09/25/19   Dahlia Byes A, NP  polyethylene glycol (MIRALAX / GLYCOLAX) 17 g packet Take 17 g by mouth daily. 11/26/18   Wieters, Hallie C, PA-C  Prenatal MV & Min w/FA-DHA (PRENATAL ADULT GUMMY/DHA/FA) 0.4-25 MG CHEW Chew 1 tablet by mouth daily. 02/11/19   Wieters, Hallie C, PA-C  omeprazole (PRILOSEC) 40 MG capsule Take 1 capsule (40 mg total) by mouth daily. 04/16/18 11/26/18  Sherrilyn Rist, MD    Family History Family History  Problem Relation Age  of Onset  . Mental illness Mother   . Anesthesia problems Neg Hx   . Stomach cancer Neg Hx   . Colon cancer Neg Hx   . Pancreatic cancer Neg Hx     Social History Social History   Tobacco Use  . Smoking status: Former Smoker    Packs/day: 0.25    Years: 0.00    Pack years: 0.00    Types: Cigarettes    Quit date: 01/04/2017    Years since quitting: 2.7  . Smokeless tobacco: Never Used  Vaping Use  . Vaping Use: Never used  Substance Use Topics  . Alcohol use: Yes    Comment: occ  . Drug use: No     Allergies   Patient has no known allergies.   Review of Systems Review of Systems   Physical Exam Triage Vital Signs ED Triage Vitals  Enc Vitals Group     BP 09/25/19 1333 132/86     Pulse Rate 09/25/19 1333 86     Resp 09/25/19 1333 16     Temp 09/25/19 1333 99.2 F (37.3 C)  Temp Source 09/25/19 1333 Oral     SpO2 09/25/19 1333 97 %     Weight --      Height --      Head Circumference --      Peak Flow --      Pain Score 09/25/19 1327 0     Pain Loc --      Pain Edu? --      Excl. in GC? --    No data found.  Updated Vital Signs BP 132/86 (BP Location: Left Arm)   Pulse 86   Temp 99.2 F (37.3 C) (Oral)   Resp 16   LMP 09/22/2019   SpO2 97%   Visual Acuity Right Eye Distance:   Left Eye Distance:   Bilateral Distance:    Right Eye Near:   Left Eye Near:    Bilateral Near:     Physical Exam Vitals and nursing note reviewed.  Constitutional:      General: She is not in acute distress.    Appearance: Normal appearance. She is not ill-appearing, toxic-appearing or diaphoretic.  HENT:     Head: Normocephalic.     Nose: Nose normal.  Eyes:     Conjunctiva/sclera: Conjunctivae normal.  Pulmonary:     Effort: Pulmonary effort is normal.  Musculoskeletal:        General: Normal range of motion.     Cervical back: Normal range of motion.  Skin:    General: Skin is warm and dry.     Findings: No rash.  Neurological:     Mental Status:  She is alert.  Psychiatric:        Mood and Affect: Mood normal.      UC Treatments / Results  Labs (all labs ordered are listed, but only abnormal results are displayed) Labs Reviewed  POCT URINALYSIS DIPSTICK, ED / UC - Abnormal; Notable for the following components:      Result Value   Hgb urine dipstick LARGE (*)    All other components within normal limits  SARS CORONAVIRUS 2 (TAT 6-24 HRS)  POC URINE PREG, ED    EKG   Radiology No results found.  Procedures Procedures (including critical care time)  Medications Ordered in UC Medications - No data to display  Initial Impression / Assessment and Plan / UC Course  I have reviewed the triage vital signs and the nursing notes.  Pertinent labs & imaging results that were available during my care of the patient were reviewed by me and considered in my medical decision making (see chart for details).     Vaginal odor Urine negative for infection. Most likely BV based on symptoms and history Treating with metronidazole Follow up as needed for continued or worsening symptoms   Final Clinical Impressions(s) / UC Diagnoses   Final diagnoses:  Vaginal odor     Discharge Instructions     Medicine as prescribed to treat BV Follow up as needed for continued or worsening symptoms     ED Prescriptions    Medication Sig Dispense Auth. Provider   metroNIDAZOLE (FLAGYL) 500 MG tablet Take 1 tablet (500 mg total) by mouth 2 (two) times daily. 14 tablet Yaritza Leist A, NP     PDMP not reviewed this encounter.   Dahlia Byes A, NP 09/25/19 1400

## 2019-09-25 NOTE — ED Triage Notes (Signed)
Pt c/o urinary frequency and odor x 2 days. Pt states she started her menstrual cycle on 09/25/2019. She denies any discharge.

## 2020-01-03 DIAGNOSIS — Z8616 Personal history of COVID-19: Secondary | ICD-10-CM

## 2020-01-03 HISTORY — DX: Personal history of COVID-19: Z86.16

## 2020-01-16 ENCOUNTER — Ambulatory Visit (HOSPITAL_COMMUNITY)
Admission: EM | Admit: 2020-01-16 | Discharge: 2020-01-16 | Disposition: A | Discharge status: Home / Self Care | Payer: Medicaid Other | Attending: Emergency Medicine | Admitting: Emergency Medicine

## 2020-01-16 ENCOUNTER — Other Ambulatory Visit: Payer: Self-pay

## 2020-01-16 ENCOUNTER — Encounter (HOSPITAL_COMMUNITY): Payer: Self-pay | Admitting: Emergency Medicine

## 2020-01-16 DIAGNOSIS — H6123 Impacted cerumen, bilateral: Secondary | ICD-10-CM

## 2020-01-16 DIAGNOSIS — Z3201 Encounter for pregnancy test, result positive: Secondary | ICD-10-CM

## 2020-01-16 DIAGNOSIS — R1084 Generalized abdominal pain: Secondary | ICD-10-CM | POA: Diagnosis not present

## 2020-01-16 DIAGNOSIS — Z3490 Encounter for supervision of normal pregnancy, unspecified, unspecified trimester: Secondary | ICD-10-CM

## 2020-01-16 DIAGNOSIS — R0981 Nasal congestion: Secondary | ICD-10-CM

## 2020-01-16 DIAGNOSIS — H9203 Otalgia, bilateral: Secondary | ICD-10-CM

## 2020-01-16 DIAGNOSIS — O219 Vomiting of pregnancy, unspecified: Secondary | ICD-10-CM

## 2020-01-16 LAB — POCT URINALYSIS DIPSTICK, ED / UC
Bilirubin Urine: NEGATIVE
Glucose, UA: NEGATIVE mg/dL
Hgb urine dipstick: NEGATIVE
Ketones, ur: NEGATIVE mg/dL
Leukocytes,Ua: NEGATIVE
Nitrite: NEGATIVE
Protein, ur: NEGATIVE mg/dL
Specific Gravity, Urine: >=1.03 (ref 1.005–1.030)
Urobilinogen, UA: 0.2 mg/dL (ref 0.0–1.0)
pH: 6.5 (ref 5.0–8.0)

## 2020-01-16 LAB — POC URINE PREG, ED: Preg Test, Ur: POSITIVE — AB

## 2020-01-16 MED ORDER — FLUTICASONE PROPIONATE 50 MCG/ACT NA SUSP: 2.0000 | Freq: Every day | NASAL | 2 refills | Status: DC

## 2020-01-16 MED ORDER — CARBAMIDE PEROXIDE 6.5 % OT SOLN: 5.0000 [drp] | Freq: Two times a day (BID) | OTIC | 0 refills | Status: DC

## 2020-01-16 NOTE — ED Triage Notes (Signed)
Patient c/o ABD pain, bilateral ear pain, and nausea x 1 week.   Patient endorses a positive COVID test 1 week ago at work.   Patient endorses nasal congestion and sneezing upon onset of symptoms.   Patient has taken Tylenol with no relief of symptoms.

## 2020-01-16 NOTE — ED Provider Notes (Signed)
[facility]    CSN: [id number] Arrival date & time: 01/16/20  1200      History   Chief Complaint Chief Complaint  Patient presents with  . Abdominal Pain  . Otalgia  . Nausea    HPI Carmen Johnston is a 40 y.o. female.   HPI Carmen Johnston is a 40 y.o. female presenting to UC with c/o generalized abdominal cramping, nausea, vomiting, bilateral ear pain and pressure for 1 week. Pt tested positive for COVID 1 week ago at work.  Denies chest pain or SOB but has had nasal congestion and sneezing. No fever or chills. She has taken tylenol without relief of ear pressure.   LMP 12/16/19. Pt is not on birth control and has had unprotected sex recently. Pt unsure of pregnancy.    Past Medical History:  Diagnosis Date  . Abnormal Pap smear   . Anemia   . Chlamydia   . Kidney stone   . Pregnancy induced hypertension   . Trichomonas   . Urinary tract infection     There are no problems to display for this patient.   Past Surgical History:  Procedure Laterality Date  . COLPOSCOPY    . INDUCED ABORTION      OB History    Gravida  8   Para  4   Term  4   Preterm  0   AB  3   Living  4     SAB  1   IAB  1   Ectopic  1   Multiple      Live Births  4            Home Medications    Prior to Admission medications   Medication Sig Start Date End Date Taking? Authorizing Provider  carbamide peroxide (DEBROX) 6.5 % OTIC solution Place 5 drops into both ears 2 (two) times daily. 01/16/20  Yes Shellene Sweigert O, PA-C  fluticasone (FLONASE) 50 MCG/ACT nasal spray Place 2 sprays into both nostrils daily. 01/16/20  Yes Avalene Sealy O, PA-C  Aspirin-Acetaminophen-Caffeine (EXCEDRIN PO) Take by mouth.    [provider]  Calcium Carbonate Antacid (TUMS E-X PO) Take by mouth.    [provider]  metroNIDAZOLE (FLAGYL) 500 MG tablet Take 1 tablet (500 mg total) by mouth 2 (two) times daily. 09/25/19   Dahlia Byes A, NP  polyethylene glycol  (MIRALAX / GLYCOLAX) 17 g packet Take 17 g by mouth daily. 11/26/18   Wieters, Hallie C, PA-C  Prenatal MV & Min w/FA-DHA (PRENATAL ADULT GUMMY/DHA/FA) 0.4-25 MG CHEW Chew 1 tablet by mouth daily. 02/11/19   Wieters, Hallie C, PA-C  omeprazole (PRILOSEC) 40 MG capsule Take 1 capsule (40 mg total) by mouth daily. 04/16/18 11/26/18  Sherrilyn Rist, MD    Family History Family History  Problem Relation Age of Onset  . Mental illness Mother   . Anesthesia problems Neg Hx   . Stomach cancer Neg Hx   . Colon cancer Neg Hx   . Pancreatic cancer Neg Hx     Social History Social History   Tobacco Use  . Smoking status: Former Smoker    Packs/day: 0.25    Years: 0.00    Pack years: 0.00    Types: Cigarettes    Quit date: 01/04/2017    Years since quitting: 3.0  . Smokeless tobacco: Never Used  Vaping Use  . Vaping Use: Never used  Substance Use Topics  .  Alcohol use: Yes    Comment: occ  . Drug use: No     Allergies   Patient has no known allergies.   Review of Systems Review of Systems  Constitutional: Negative for chills and fever.  HENT: Positive for congestion, ear pain and sinus pressure. Negative for sore throat, trouble swallowing and voice change.   Respiratory: Negative for cough and shortness of breath.   Cardiovascular: Negative for chest pain and palpitations.  Gastrointestinal: Positive for abdominal pain, nausea and vomiting. Negative for diarrhea.  Musculoskeletal: Negative for arthralgias, back pain and myalgias.  Skin: Negative for rash.  Neurological: Negative for dizziness, light-headedness and headaches.  All other systems reviewed and are negative.    Physical Exam Triage Vital Signs ED Triage Vitals  Enc Vitals Group     BP 01/16/20 1357 125/84     Pulse Rate 01/16/20 1357 75     Resp 01/16/20 1357 18     Temp 01/16/20 1357 98.7 F (37.1 C)     Temp Source 01/16/20 1357 Oral     SpO2 01/16/20 1357 100 %     Weight 01/16/20 1355 185 lb (83.9  kg)     Height 01/16/20 1355 5\' 7"  (1.702 m)     Head Circumference --      Peak Flow --      Pain Score 01/16/20 1354 8     Pain Loc --      Pain Edu? --      Excl. in GC? --    No data found.  Updated Vital Signs BP 125/84 (BP Location: Left Arm)   Pulse 75   Temp 98.7 F (37.1 C) (Oral)   Resp 18   Ht 5\' 7"  (1.702 m)   Wt 185 lb (83.9 kg)   LMP 12/16/2019   SpO2 100%   BMI 28.98 kg/m   Visual Acuity Right Eye Distance:   Left Eye Distance:   Bilateral Distance:    Right Eye Near:   Left Eye Near:    Bilateral Near:     Physical Exam Vitals and nursing note reviewed.  Constitutional:      General: She is not in acute distress.    Appearance: She is well-developed and well-nourished. She is not ill-appearing, toxic-appearing or diaphoretic.  HENT:     Head: Normocephalic and atraumatic.     Right Ear: Tympanic membrane normal. There is impacted cerumen. Tympanic membrane is not erythematous or bulging.     Left Ear: Tympanic membrane normal. There is impacted cerumen. Tympanic membrane is not erythematous or bulging.     Nose: Congestion present.     Right Sinus: No maxillary sinus tenderness or frontal sinus tenderness.     Left Sinus: No maxillary sinus tenderness or frontal sinus tenderness.     Mouth/Throat:     Lips: Pink.     Mouth: Mucous membranes are moist.     Pharynx: Oropharynx is clear. Uvula midline. No pharyngeal swelling, oropharyngeal exudate, posterior oropharyngeal erythema or uvula swelling.  Eyes:     Extraocular Movements: EOM normal.  Cardiovascular:     Rate and Rhythm: Normal rate and regular rhythm.  Pulmonary:     Effort: Pulmonary effort is normal. No respiratory distress.     Breath sounds: Normal breath sounds. No stridor. No wheezing, rhonchi or rales.  Abdominal:     General: There is no distension.     Palpations: Abdomen is soft.     Tenderness: There is no  abdominal tenderness.  Musculoskeletal:        General: Normal  range of motion.     Cervical back: Normal range of motion.  Skin:    General: Skin is warm and dry.  Neurological:     Mental Status: She is alert and oriented to person, place, and time.  Psychiatric:        Mood and Affect: Mood and affect normal.        Behavior: Behavior normal.      UC Treatments / Results  Labs (all labs ordered are listed, but only abnormal results are displayed) Labs Reviewed  POC URINE PREG, ED - Abnormal; Notable for the following components:      Result Value   Preg Test, Ur POSITIVE (*)    All other components within normal limits  POCT URINALYSIS DIPSTICK, ED / UC    EKG   Radiology No results found.  Procedures Procedures (including critical care time)  Medications Ordered in UC Medications - No data to display  Initial Impression / Assessment and Plan / UC Course  I have reviewed the triage vital signs and the nursing notes.  Pertinent labs & imaging results that were available during my care of the patient were reviewed by me and considered in my medical decision making (see chart for details).    Urine pregnancy: POSITIVE This was an unplanned pregnancy Pt does not plan on keeping the pregnancy Advised to schedule appointment with health department or Women's Clinic to discuss options.  No evidence of bacterial infection at this time Encouraged sinus rinses Debrox and flonase for sinus pressure  Final Clinical Impressions(s) / UC Diagnoses   Final diagnoses:  Unexpected pregnancy  Nausea and vomiting in pregnancy  Acute ear pain, bilateral  Nasal congestion  Bilateral impacted cerumen     Discharge Instructions      Please call the health department or schedule a follow up appointment with women's health clinic to discuss pregnancy options.   Establish care with primary care for ongoing healthcare needs and recheck of ear pressure if not improving in 1 week.     ED Prescriptions    Medication Sig Dispense Auth.  Provider   fluticasone (FLONASE) 50 MCG/ACT nasal spray Place 2 sprays into both nostrils daily. 16 g Waylan Rocher O, PA-C   carbamide peroxide (DEBROX) 6.5 % OTIC solution Place 5 drops into both ears 2 (two) times daily. 15 mL Lurene Shadow, PA-C     PDMP not reviewed this encounter.   Lurene Shadow, [place] 01/16/20 [phone]

## 2020-01-16 NOTE — Discharge Instructions (Signed)
°  Please call the health department or schedule a follow up appointment with women's health clinic to discuss pregnancy options.   Establish care with primary care for ongoing healthcare needs and recheck of ear pressure if not improving in 1 week.

## 2020-02-24 IMAGING — CT CT HEAD W/O CM
4 series · 15 of 47 positions shown, 17 images · non-contrast
Comparison: None.

CLINICAL DATA: Numbness or tingling, paresthesia. Awoke with left
arm weakness.

EXAM:
CT HEAD WITHOUT CONTRAST
TECHNIQUE: Contiguous axial images were obtained from the base of the skull
through the vertex without intravenous contrast.

[Series 3: head wo · axial · 0.47mm/px · z∈[-114,+6]mm · 7 of 34 slices shown, 9 images]
[im 5/34  brain]
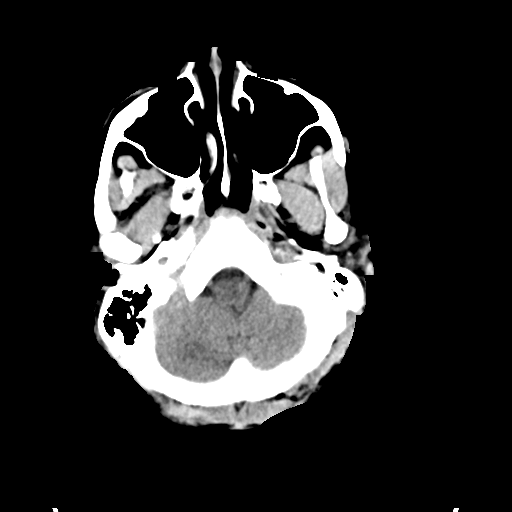
[im 5/34  bone]
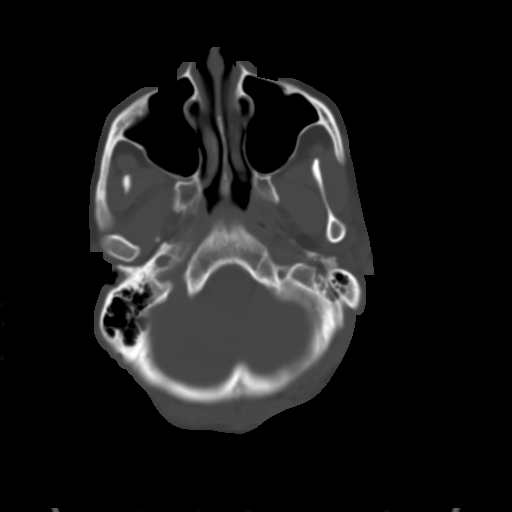
[im 9/34  brain]
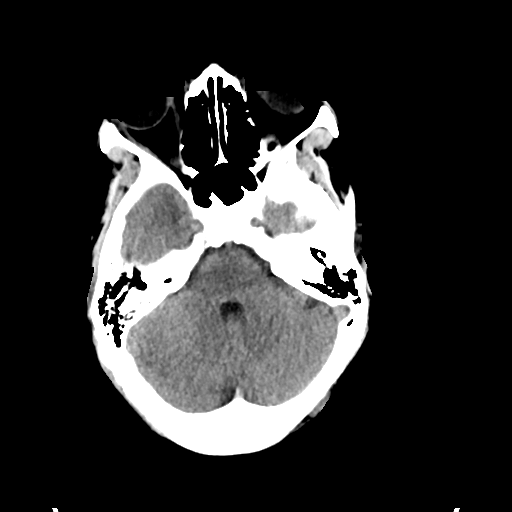
[im 13/34  brain]
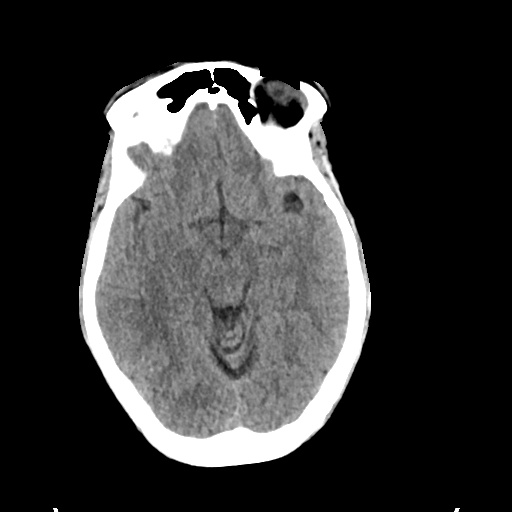
[im 17/34  brain]
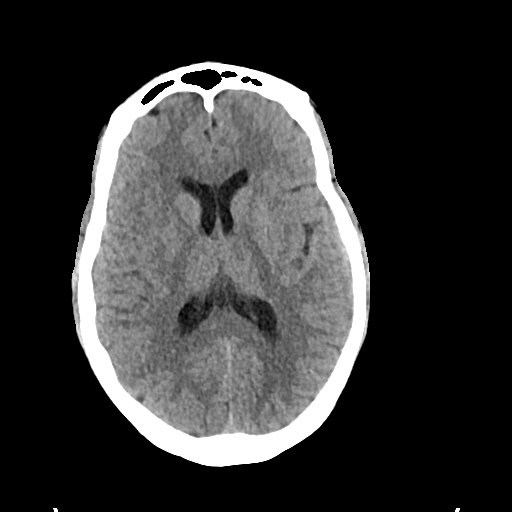
[im 21/34  brain]
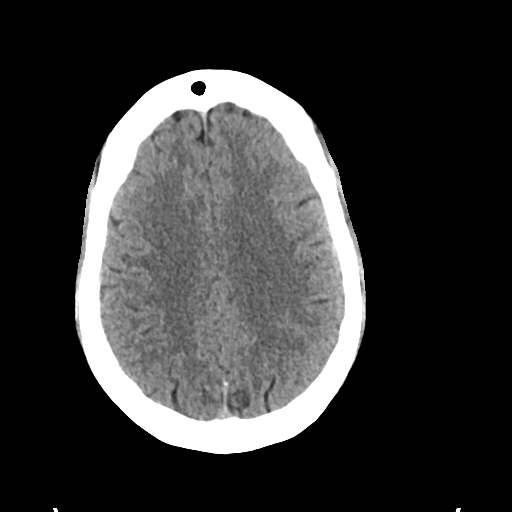
[im 21/34  bone]
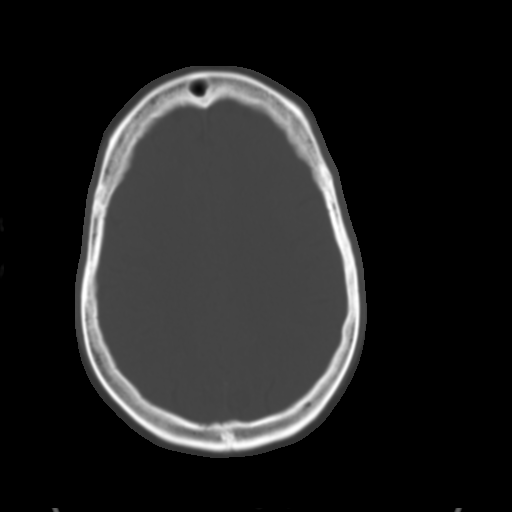
[im 25/34  brain]
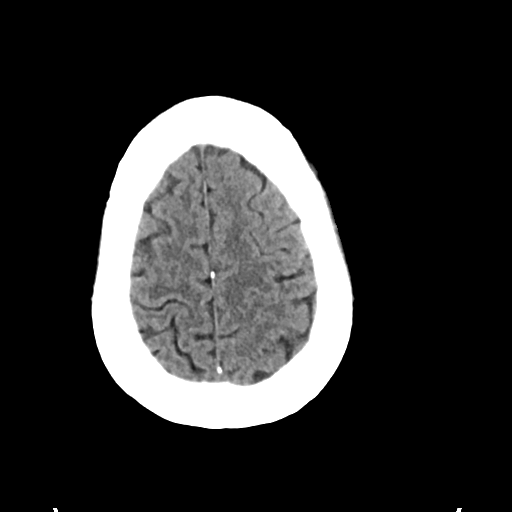
[im 29/34  brain]
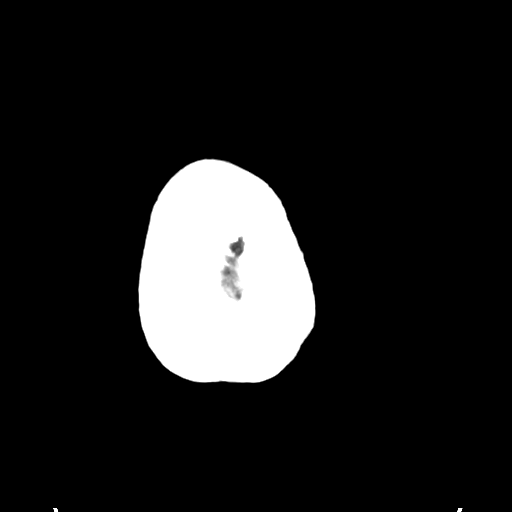

[Series 4: head bone · axial · 0.47mm/px · z∈[-118,-102]mm · 2 of 85 slices shown]
[im 9/85  bone]
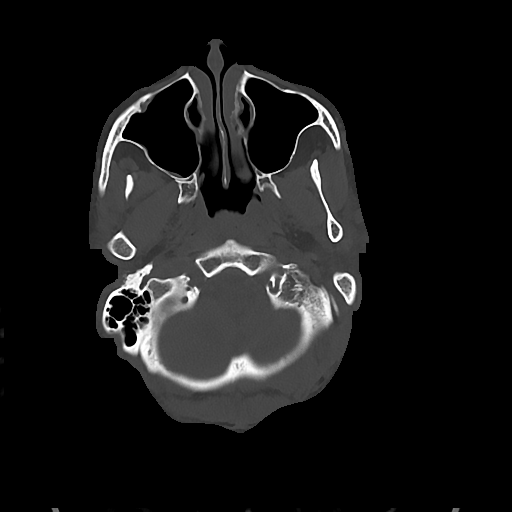
[im 17/85  bone]
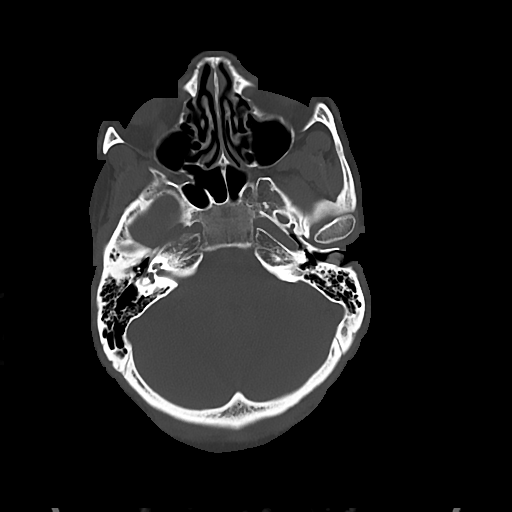

[Series 5: cor soft · coronal · 0.39mm/px · 3 of 73 slices shown]
[im 25/73  brain]
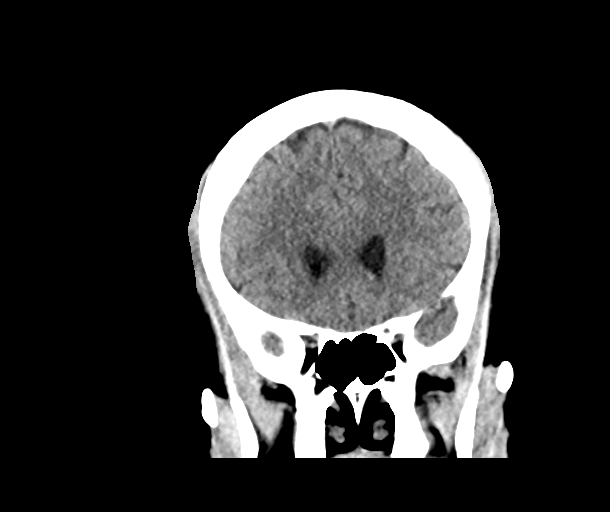
[im 33/73  brain]
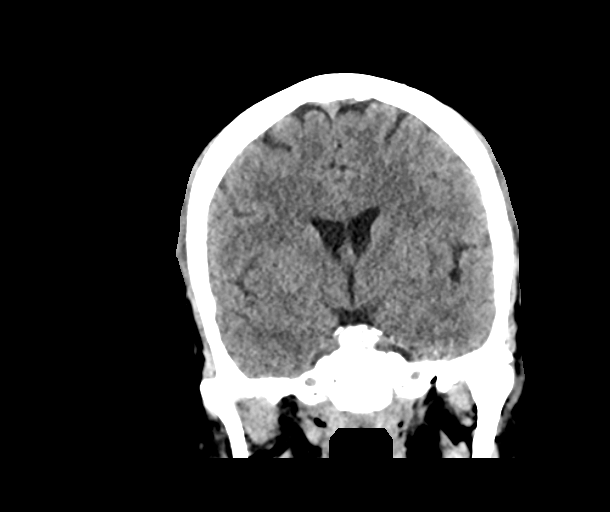
[im 41/73  brain]
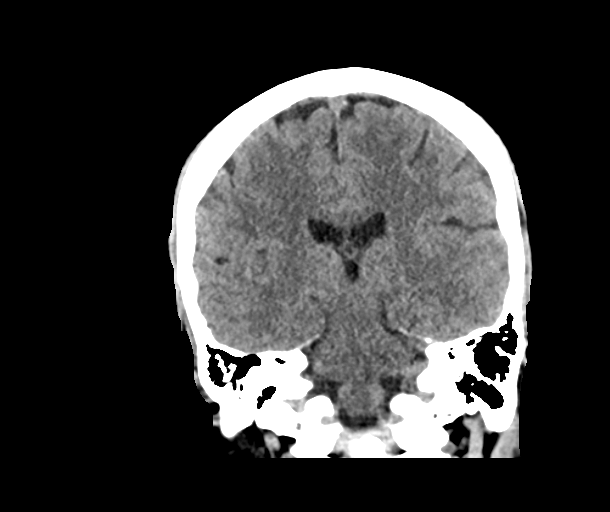

[Series 6: sag soft · sagittal · 0.33mm/px · 3 of 67 slices shown]
[im 23/67  brain]
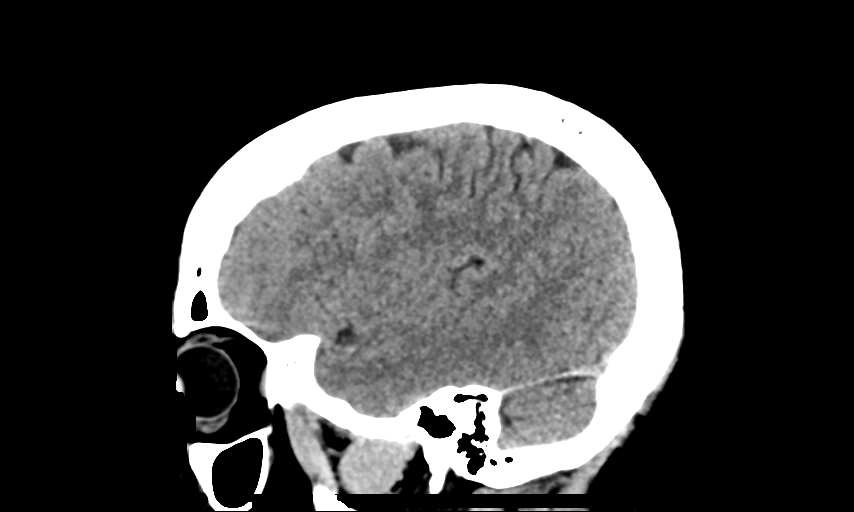
[im 34/67  brain]
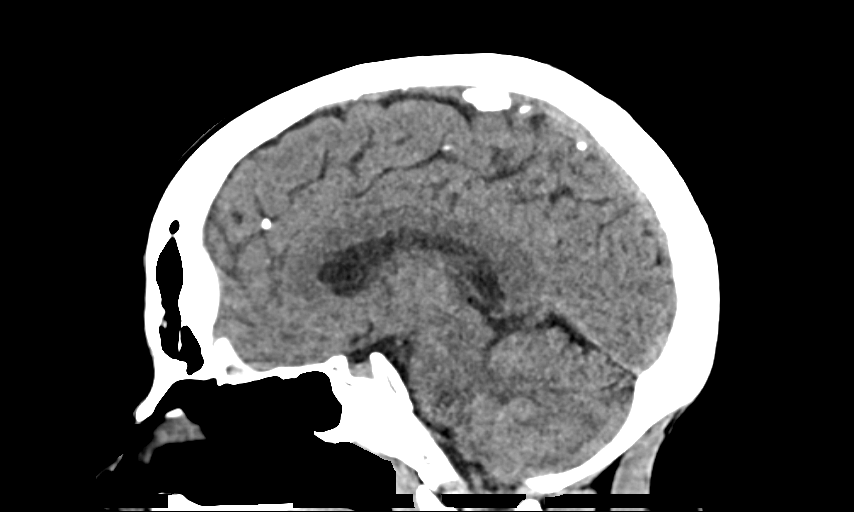
[im 45/67  brain]
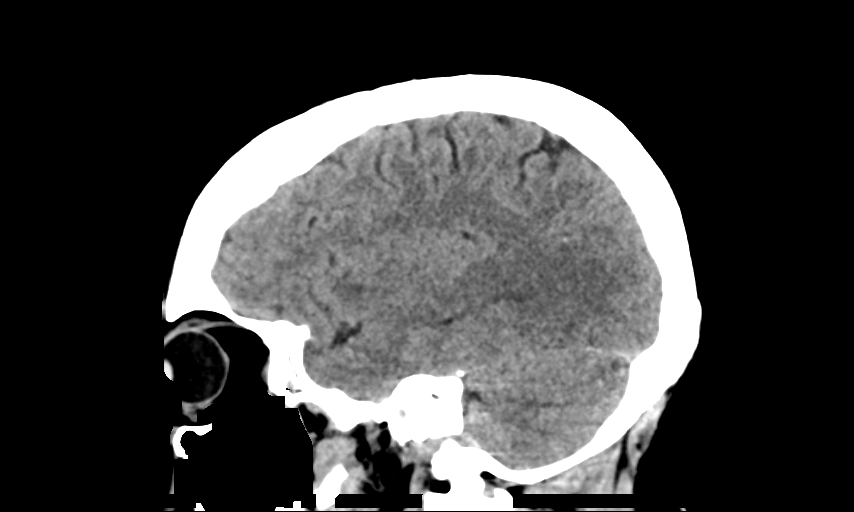

[15 of 47 positions shown; findings below may reference images not displayed]

FINDINGS: Brain: Brain volume is normal for age. No intracranial hemorrhage,
mass effect, or midline shift. No hydrocephalus. The basilar
cisterns are patent. No evidence of territorial infarct or acute
ischemia. No extra-axial or intracranial fluid collection.

Vascular: No hyperdense vessel or unexpected calcification.

Skull: Normal. Negative for fracture or focal lesion.

Sinuses/Orbits: Paranasal sinuses and mastoid air cells are clear.
The visualized orbits are unremarkable.

Other: None.
IMPRESSION: Negative head CT.

## 2020-04-15 ENCOUNTER — Ambulatory Visit (HOSPITAL_COMMUNITY)
Admission: EM | Admit: 2020-04-15 | Discharge: 2020-04-15 | Disposition: A | Discharge status: Home / Self Care | Payer: Medicaid Other | Attending: Nurse Practitioner | Admitting: Nurse Practitioner

## 2020-04-15 ENCOUNTER — Other Ambulatory Visit: Payer: Self-pay

## 2020-04-15 ENCOUNTER — Encounter (HOSPITAL_COMMUNITY): Payer: Self-pay | Admitting: Emergency Medicine

## 2020-04-15 DIAGNOSIS — M79671 Pain in right foot: Secondary | ICD-10-CM | POA: Diagnosis not present

## 2020-04-15 MED ORDER — IBUPROFEN 600 MG PO TABS: 600.0000 mg | ORAL_TABLET | Freq: Four times a day (QID) | ORAL | 0 refills | Status: DC | PRN

## 2020-04-15 MED ORDER — METHYLPREDNISOLONE 4 MG PO TBPK: ORAL_TABLET | ORAL | 0 refills | Status: DC

## 2020-04-15 NOTE — ED Triage Notes (Signed)
For the past 2 weeks has had pain in right lateral foot.  Patient denies any injury.  No change in work activities or job description.

## 2020-04-15 NOTE — Discharge Instructions (Addendum)
Take medications as prescribed  Wear post-op shoe  Apply ICE to affected area at least three times a day  Wear comfortable, supportive shoes that fit you well.   FOLLOW UP WITH ORTHO IF NO IMPROVEMENT IN  YOUR SYMPTOMS  Get help right away if: Your foot is numb or tingling. Your foot or toes are swollen. Your foot or toes turn white or blue. You have warmth and redness along your foot.

## 2020-04-15 NOTE — ED Provider Notes (Addendum)
[facility]    CSN: [id number] Arrival date & time: 04/15/20  1306      History   Chief Complaint Chief Complaint  Patient presents with  . Foot Pain    HPI KEOSHA ROSSA is a 40 y.o. female.   History of Present Illness  JENESSA GILLINGHAM is a 40 y.o. female that complains of pain in the lateral and dorsal aspect of the right foot without radiation. Patient denies any injury. Onset of symptoms was about 2 weeks ago. Patient describes pain as aching. Pain severity is moderate. Pain is aggravated by weight bearing. Pain is alleviated by rest.  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 She admits that she may not be wearing the best shoes to be working in. After resting and when not at work, the pain is minimal and barely present at all. She denies any numbness, tingling, weakness, loss of sensation, loss of movement or inability to bear weight.  Care prior to arrival consisted of rest and NSAIDs, with minimal relief.       Past Medical History:  Diagnosis Date  . Abnormal Pap smear   . Anemia   . Chlamydia   . Kidney stone   . Pregnancy induced hypertension   . Trichomonas   . Urinary tract infection     There are no problems to display for this patient.   Past Surgical History:  Procedure Laterality Date  . COLPOSCOPY    . INDUCED ABORTION      OB History    Gravida  8   Para  4   Term  4   Preterm  0   AB  3   Living  4     SAB  1   IAB  1   Ectopic  1   Multiple      Live Births  4            Home Medications    Prior to Admission medications   Medication Sig Start Date End Date Taking? Authorizing Provider  ibuprofen (ADVIL) 600 MG tablet Take 1 tablet (600 mg total) by mouth every 6 (six) hours as needed. 04/15/20  Yes Lurline Idol, FNP  methylPREDNISolone (MEDROL DOSEPAK) 4 MG TBPK tablet Take as directed 04/15/20  Yes Lurline Idol, FNP   Aspirin-Acetaminophen-Caffeine (EXCEDRIN PO) Take by mouth.    [provider]  carbamide peroxide (DEBROX) 6.5 % OTIC solution Place 5 drops into both ears 2 (two) times daily. 01/16/20   Lurene Shadow, PA-C  fluticasone (FLONASE) 50 MCG/ACT nasal spray Place 2 sprays into both nostrils daily. 01/16/20 04/15/20  Lurene Shadow, PA-C  omeprazole (PRILOSEC) 40 MG capsule Take 1 capsule (40 mg total) by mouth daily. 04/16/18 11/26/18  Sherrilyn Rist, MD    Family History Family History  Problem Relation Age of Onset  . Mental illness Mother   . Anesthesia problems Neg Hx   . Stomach cancer Neg Hx   . Colon cancer Neg Hx   . Pancreatic cancer Neg Hx     Social History Social History   Tobacco Use  . Smoking status: Former Smoker    Packs/day: 0.25    Years: 0.00    Pack years: 0.00    Types: Cigarettes    Quit date: 01/04/2017    Years since quitting: 3.2  . Smokeless tobacco: Never Used  Vaping Use  .  Vaping Use: Never used  Substance Use Topics  . Alcohol use: Yes    Comment: occ  . Drug use: No     Allergies   Patient has no known allergies.   Review of Systems Review of Systems  Musculoskeletal: Negative for gait problem.       Right foot pain   Neurological: Negative for numbness.  All other systems reviewed and are negative.    Physical Exam Triage Vital Signs ED Triage Vitals  Enc Vitals Group     BP 04/15/20 1430 122/90     Pulse Rate 04/15/20 1430 82     Resp 04/15/20 1430 18     Temp 04/15/20 1430 98.2 F (36.8 C)     Temp Source 04/15/20 1430 Oral     SpO2 04/15/20 1430 100 %     Weight --      Height --      Head Circumference --      Peak Flow --      Pain Score 04/15/20 1427 8     Pain Loc --      Pain Edu? --      Excl. in GC? --    No data found.  Updated Vital Signs BP 122/90 (BP Location: Right Arm)   Pulse 82   Temp 98.2 F (36.8 C) (Oral)   Resp 18   LMP 04/11/2020   SpO2 100%   Visual Acuity Right Eye  Distance:   Left Eye Distance:   Bilateral Distance:    Right Eye Near:   Left Eye Near:    Bilateral Near:     Physical Exam Vitals reviewed.  Constitutional:      Appearance: Normal appearance.  HENT:     Head: Normocephalic.  Cardiovascular:     Rate and Rhythm: Normal rate.  Pulmonary:     Effort: Pulmonary effort is normal.  Musculoskeletal:        General: Normal range of motion.     Cervical back: Normal range of motion.     Right foot: Normal range of motion. Tenderness present. No swelling, deformity or bony tenderness.     Left foot: Normal.       Feet:     Comments: Tenderness to both the lateral and dorsal aspect of the right foot.   Feet:     Right foot:     Skin integrity: Skin integrity normal.     Toenail Condition: Right toenails are normal.  Skin:    General: Skin is warm and dry.  Neurological:     General: No focal deficit present.     Mental Status: She is alert and oriented to person, place, and time.      UC Treatments / Results  Labs (all labs ordered are listed, but only abnormal results are displayed) Labs Reviewed - No data to display  EKG   Radiology No results found.  Procedures Procedures (including critical care time)  Medications Ordered in UC Medications - No data to display  Initial Impression / Assessment and Plan / UC Course  I have reviewed the triage vital signs and the nursing notes.  Pertinent labs & imaging results that were available during my care of the patient were reviewed by me and considered in my medical decision making (see chart for details).    40 yo female presenting with right foot pain.  Pain has been ongoing for the past couple weeks.  No injury.  Pain aggravated by weightbearing  and alleviated by rest.  It mainly occurs while working.  No numbness, tingling, weakness, loss of sensation, loss of movement or inability to bear weight.  Physical exam shows no deformity, swelling, erythema or  abnormalities within the foot.  Supportive care advised at this time with postop shoe, ice, NSAIDs and a short burst of steroids.  Advised patient to follow-up with orthopedics if no improvement of symptoms after completing prescribed regimen  Today's evaluation has revealed no signs of a dangerous process. Discussed diagnosis with patient and/or guardian. Patient and/or guardian aware of their diagnosis, possible red flag symptoms to watch out for and need for close follow up. Patient and/or guardian understands verbal and written discharge instructions. Patient and/or guardian comfortable with plan and disposition.  Patient and/or guardian has a clear mental status at this time, good insight into illness (after discussion and teaching) and has clear judgment to make decisions regarding their care  This care was provided during an unprecedented National Emergency due to the Novel Coronavirus (COVID-19) pandemic. COVID-19 infections and transmission risks place heavy strains on healthcare resources.  As this pandemic evolves, our facility, providers, and staff strive to respond fluidly, to remain operational, and to provide care relative to available resources and information. Outcomes are unpredictable and treatments are without well-defined guidelines. Further, the impact of COVID-19 on all aspects of urgent care, including the impact to patients seeking care for reasons other than COVID-19, is unavoidable during this national emergency. At this time of the global pandemic, management of patients has significantly changed, even for non-COVID positive patients given high local and regional COVID volumes at this time requiring high healthcare system and resource utilization. The standard of care for management of both COVID suspected and non-COVID suspected patients continues to change rapidly at the local, regional, national, and global levels. This patient was worked up and treated to the best available but  ever changing evidence and resources available at this current time.   Documentation was completed with the aid of voice recognition software. Transcription may contain typographical errors.  Final Clinical Impressions(s) / UC Diagnoses   Final diagnoses:  Foot pain, right     Discharge Instructions     . Take medications as prescribed  . Wear post-op shoe  . Apply ICE to affected area at least three times a day  . Wear comfortable, supportive shoes that fit you well.   FOLLOW UP WITH ORTHO IF NO IMPROVEMENT IN  YOUR SYMPTOMS  Get help right away if:  Your foot is numb or tingling.  Your foot or toes are swollen.  Your foot or toes turn white or blue.  You have warmth and redness along your foot.     ED Prescriptions    Medication Sig Dispense Auth. Provider   ibuprofen (ADVIL) 600 MG tablet Take 1 tablet (600 mg total) by mouth every 6 (six) hours as needed. 30 tablet Lurline Idol, FNP   methylPREDNISolone (MEDROL DOSEPAK) 4 MG TBPK tablet Take as directed 21 tablet Lurline Idol, FNP     PDMP not reviewed this encounter.   Lurline Idol, FNP 04/15/20 1533    Lurline Idol, [place] 04/15/20 1535

## 2020-05-30 ENCOUNTER — Encounter (HOSPITAL_BASED_OUTPATIENT_CLINIC_OR_DEPARTMENT_OTHER): Payer: Self-pay

## 2020-05-30 ENCOUNTER — Other Ambulatory Visit: Payer: Self-pay

## 2020-05-30 ENCOUNTER — Emergency Department (HOSPITAL_BASED_OUTPATIENT_CLINIC_OR_DEPARTMENT_OTHER): Payer: Medicaid Other | Admitting: Radiology

## 2020-05-30 ENCOUNTER — Emergency Department (HOSPITAL_BASED_OUTPATIENT_CLINIC_OR_DEPARTMENT_OTHER)
Admission: EM | Admit: 2020-05-30 | Discharge: 2020-05-30 | Disposition: A | Discharge status: Home / Self Care | Payer: Medicaid Other | Attending: Emergency Medicine | Admitting: Emergency Medicine

## 2020-05-30 DIAGNOSIS — S29011A Strain of muscle and tendon of front wall of thorax, initial encounter: Secondary | ICD-10-CM

## 2020-05-30 DIAGNOSIS — Z87891 Personal history of nicotine dependence: Secondary | ICD-10-CM | POA: Insufficient documentation

## 2020-05-30 DIAGNOSIS — Z7982 Long term (current) use of aspirin: Secondary | ICD-10-CM | POA: Insufficient documentation

## 2020-05-30 DIAGNOSIS — R0789 Other chest pain: Secondary | ICD-10-CM | POA: Diagnosis not present

## 2020-05-30 DIAGNOSIS — R079 Chest pain, unspecified: Secondary | ICD-10-CM | POA: Insufficient documentation

## 2020-05-30 NOTE — Discharge Instructions (Addendum)
If you develop recurrent, continued, or worsening chest pain, shortness of breath, fever, vomiting, abdominal or back pain, or any other new/concerning symptoms then return to the ER for evaluation.  

## 2020-05-30 NOTE — ED Triage Notes (Signed)
States moving bed and then developed chest pain radiated into left side back.  Pain worse with movement.

## 2020-05-30 NOTE — ED Provider Notes (Signed)
[facility] EMERGENCY DEPT Provider Note   CSN: [id number] Arrival date & time: 05/30/20  1356     History Chief Complaint  Patient presents with  . Chest Pain    Carmen Johnston is a 40 y.o. female.  HPI 40 year old female presents with chest pain.  Started about an hour prior to arrival.  She was lifting up a bunk bed that had fallen down and felt an immediate pain in her chest.  It was sharp and diffuse.  After that, any type of movement seem to make it worse.  This has calm down and she took some Tylenol.  Still has residual pain but is better.  No shortness of breath or exertional component.    Past Medical History:  Diagnosis Date  . Abnormal Pap smear   . Anemia   . Chlamydia   . Kidney stone   . Pregnancy induced hypertension   . Trichomonas   . Urinary tract infection     There are no problems to display for this patient.   Past Surgical History:  Procedure Laterality Date  . COLPOSCOPY    . INDUCED ABORTION       OB History    Gravida  8   Para  4   Term  4   Preterm  0   AB  3   Living  4     SAB  1   IAB  1   Ectopic  1   Multiple      Live Births  4           Family History  Problem Relation Age of Onset  . Mental illness Mother   . Anesthesia problems Neg Hx   . Stomach cancer Neg Hx   . Colon cancer Neg Hx   . Pancreatic cancer Neg Hx     Social History   Tobacco Use  . Smoking status: Former Smoker    Packs/day: 0.25    Years: 0.00    Pack years: 0.00    Types: Cigarettes    Quit date: 01/04/2017    Years since quitting: 3.4  . Smokeless tobacco: Never Used  Vaping Use  . Vaping Use: Never used  Substance Use Topics  . Alcohol use: Yes    Comment: occ  . Drug use: No    Home Medications Prior to Admission medications   Medication Sig Start Date End Date Taking? Authorizing Provider  Aspirin-Acetaminophen-Caffeine (EXCEDRIN PO) Take by mouth.    [provider]  carbamide peroxide  (DEBROX) 6.5 % OTIC solution Place 5 drops into both ears 2 (two) times daily. 01/16/20   Lurene Shadow, PA-C  ibuprofen (ADVIL) 600 MG tablet Take 1 tablet (600 mg total) by mouth every 6 (six) hours as needed. 04/15/20   Lurline Idol, FNP  methylPREDNISolone (MEDROL DOSEPAK) 4 MG TBPK tablet Take as directed 04/15/20   Lurline Idol, FNP  fluticasone (FLONASE) 50 MCG/ACT nasal spray Place 2 sprays into both nostrils daily. 01/16/20 04/15/20  Lurene Shadow, PA-C  omeprazole (PRILOSEC) 40 MG capsule Take 1 capsule (40 mg total) by mouth daily. 04/16/18 11/26/18  Sherrilyn Rist, MD    Allergies    Patient has no known allergies.  Review of Systems   Review of Systems  Constitutional: Negative for fever.  Respiratory: Negative for shortness of breath.   Cardiovascular: Positive for chest pain.  Gastrointestinal: Negative for abdominal pain.  All other systems reviewed and  are negative.   Physical Exam Updated Vital Signs BP 116/90 (BP Location: Right Arm)   Pulse 68   Temp 98.6 F (37 C) (Oral)   Resp 13   Ht 5\' 8"  (1.727 m)   Wt 90.7 kg   SpO2 99%   BMI 30.41 kg/m   Physical Exam Vitals and nursing note reviewed.  Constitutional:      General: She is not in acute distress.    Appearance: She is well-developed. She is not ill-appearing or diaphoretic.  HENT:     Head: Normocephalic and atraumatic.     Right Ear: External ear normal.     Left Ear: External ear normal.     Nose: Nose normal.  Eyes:     General:        Right eye: No discharge.        Left eye: No discharge.  Cardiovascular:     Rate and Rhythm: Normal rate and regular rhythm.     Heart sounds: Normal heart sounds.  Pulmonary:     Effort: Pulmonary effort is normal.     Breath sounds: Normal breath sounds.  Chest:     Chest wall: No tenderness.  Abdominal:     Palpations: Abdomen is soft.     Tenderness: There is no abdominal tenderness.  Skin:    General: Skin is warm and dry.   Neurological:     Mental Status: She is alert.  Psychiatric:        Mood and Affect: Mood is not anxious.     ED Results / Procedures / Treatments   Labs (all labs ordered are listed, but only abnormal results are displayed) Labs Reviewed - No data to display  EKG EKG Interpretation  Date/Time:  Sunday May 30 2020 14:04:03 EDT Ventricular Rate:  72 PR Interval:  174 QRS Duration: 96 QT Interval:  378 QTC Calculation: 414 R Axis:   75 Text Interpretation: Sinus rhythm no acute ST/T changes no significant change since Jan 2020 Confirmed by Feb 2020 [phone]) on 05/30/2020 2:08:31 PM   Radiology No results found.  Procedures Procedures   Medications Ordered in ED Medications - No data to display  ED Course  I have reviewed the triage vital signs and the nursing notes.  Pertinent labs & imaging results that were available during my care of the patient were reviewed by me and considered in my medical decision making (see chart for details).    MDM Rules/Calculators/A&P                          Patient's pain is likely coming from a chest wall muscle strain.  I discussed work-up with patient and she wants to make sure she does not have a heart attack.  I discussed that her ECG appears well and no obvious ischemia.  However to fully rule out we would need troponins and other labs.  She declines blood work.  I do think is reasonable as I think this is likely a chest wall strain given the history.  She does agree to a chest x-ray, which is unremarkable. Will d/c with return precautions.  Final Clinical Impression(s) / ED Diagnoses Final diagnoses:  None    Rx / DC Orders ED Discharge Orders    None       06/01/2020, MD 05/30/20 1559

## 2020-05-31 ENCOUNTER — Telehealth: Payer: Self-pay

## 2020-05-31 NOTE — Telephone Encounter (Signed)
Transition Care Management Follow-up Telephone Call  Date of discharge and from where: 05/30/2020 from [facility]  How have you been since you were released from the hospital? Pt stated that she is feeling a lot better. Pt is interested in est with a PCP. Pt given number for [facility] and she will call this week to est care.   Any questions or concerns? No  Items Reviewed:  Did the pt receive and understand the discharge instructions provided? Yes   Medications obtained and verified? No   Other? No   Any new allergies since your discharge? Yes   Dietary orders reviewed? n/a  Do you have support at home? Yes   Functional Questionnaire: (I = Independent and D = Dependent) ADLs: I  Bathing/Dressing- I  Meal Prep- I  Eating- I  Maintaining continence- I  Transferring/Ambulation- I  Managing Meds- I  Follow up appointments reviewed:   PCP Hospital f/u appt confirmed? No    Specialist Hospital f/u appt confirmed? No    Are transportation arrangements needed? No   If their condition worsens, is the pt aware to call PCP or go to the Emergency Dept.? Yes  Was the patient provided with contact information for the PCP's office or ED? Yes  Was to pt encouraged to call back with questions or concerns? Yes

## 2020-06-18 ENCOUNTER — Encounter (HOSPITAL_COMMUNITY): Payer: Self-pay

## 2020-06-18 ENCOUNTER — Other Ambulatory Visit: Payer: Self-pay

## 2020-06-18 ENCOUNTER — Ambulatory Visit (HOSPITAL_COMMUNITY)
Admission: EM | Admit: 2020-06-18 | Discharge: 2020-06-18 | Disposition: A | Discharge status: Home / Self Care | Payer: Medicaid Other | Attending: Internal Medicine | Admitting: Internal Medicine

## 2020-06-18 DIAGNOSIS — R1031 Right lower quadrant pain: Secondary | ICD-10-CM | POA: Diagnosis not present

## 2020-06-18 DIAGNOSIS — R81 Glycosuria: Secondary | ICD-10-CM | POA: Insufficient documentation

## 2020-06-18 DIAGNOSIS — R8290 Unspecified abnormal findings in urine: Secondary | ICD-10-CM | POA: Diagnosis not present

## 2020-06-18 DIAGNOSIS — R350 Frequency of micturition: Secondary | ICD-10-CM | POA: Diagnosis not present

## 2020-06-18 DIAGNOSIS — R1032 Left lower quadrant pain: Secondary | ICD-10-CM | POA: Insufficient documentation

## 2020-06-18 LAB — POCT URINALYSIS DIPSTICK, ED / UC
Bilirubin Urine: NEGATIVE
Glucose, UA: 250 mg/dL — AB
Hgb urine dipstick: NEGATIVE
Nitrite: POSITIVE — AB
Protein, ur: 100 mg/dL — AB
Specific Gravity, Urine: 1.01 (ref 1.005–1.030)
Urobilinogen, UA: 4 mg/dL — ABNORMAL HIGH (ref 0.0–1.0)
pH: 5 (ref 5.0–8.0)

## 2020-06-18 LAB — POC URINE PREG, ED: Preg Test, Ur: NEGATIVE

## 2020-06-18 MED ORDER — AMOXICILLIN 500 MG PO CAPS: 500.0000 mg | ORAL_CAPSULE | Freq: Three times a day (TID) | ORAL | 0 refills | Status: AC

## 2020-06-18 NOTE — ED Provider Notes (Signed)
[facility]    CSN: [id number] Arrival date & time: 06/18/20  0912      History   Chief Complaint Chief Complaint  Patient presents with   Urinary Frequency   Abdominal Pain    HPI Carmen Johnston is a 40 y.o. female.   40 year old female presents with lower central to bilateral abdominal cramping that started 2 days ago. Yesterday started experiencing urinary frequency, urgency and decreased urine along with some nausea. Denies any fever, vomiting or unusual vaginal discharge or vaginal odor. Took Excedrin and AZO with some relief. LMP about 1 week ago. History of recurrent UTI's- usually gets 2 to 3 per year. History of renal calculi, migraines and anemia. Not currently on any daily medication.   The history is provided by the patient.   Past Medical History:  Diagnosis Date   Abnormal Pap smear    Anemia    Chlamydia    Kidney stone    Pregnancy induced hypertension    Trichomonas    Urinary tract infection     There are no problems to display for this patient.   Past Surgical History:  Procedure Laterality Date   COLPOSCOPY     INDUCED ABORTION      OB History     Gravida  8   Para  4   Term  4   Preterm  0   AB  3   Living  4      SAB  1   IAB  1   Ectopic  1   Multiple      Live Births  4            Home Medications    Prior to Admission medications   Medication Sig Start Date End Date Taking? Authorizing Provider  amoxicillin (AMOXIL) 500 MG capsule Take 1 capsule (500 mg total) by mouth 3 (three) times daily for 5 days. 06/18/20 06/23/20 Yes Amorina Doerr, Ali Lowe, NP  Aspirin-Acetaminophen-Caffeine (EXCEDRIN PO) Take by mouth.   Yes [provider]  ibuprofen (ADVIL) 600 MG tablet Take 1 tablet (600 mg total) by mouth every 6 (six) hours as needed. 04/15/20  Yes Lurline Idol, FNP  fluticasone (FLONASE) 50 MCG/ACT nasal spray Place 2 sprays into both nostrils daily. 01/16/20 04/15/20  Lurene Shadow, PA-C   omeprazole (PRILOSEC) 40 MG capsule Take 1 capsule (40 mg total) by mouth daily. 04/16/18 11/26/18  Sherrilyn Rist, MD    Family History Family History  Problem Relation Age of Onset   Mental illness Mother    Anesthesia problems Neg Hx    Stomach cancer Neg Hx    Colon cancer Neg Hx    Pancreatic cancer Neg Hx     Social History Social History   Tobacco Use   Smoking status: Former    Packs/day: 0.25    Years: 0.00    Pack years: 0.00    Types: Cigarettes    Quit date: 01/04/2017    Years since quitting: 3.4   Smokeless tobacco: Never  Vaping Use   Vaping Use: Never used  Substance Use Topics   Alcohol use: Yes    Comment: occ   Drug use: Yes    Types: Marijuana    Comment: occasionally     Allergies   Patient has no known allergies.   Review of Systems Review of Systems  Constitutional:  Positive for appetite change and fatigue. Negative for activity change, chills and fever.  Gastrointestinal:  Positive for abdominal pain (lower abdominal cramping) and nausea. Negative for vomiting.  Genitourinary:  Positive for decreased urine volume, frequency and urgency. Negative for difficulty urinating, dysuria, flank pain, hematuria, vaginal bleeding, vaginal discharge and vaginal pain.  Musculoskeletal:  Negative for arthralgias, back pain and myalgias.  Skin:  Negative for color change and rash.  Allergic/Immunologic: Negative for environmental allergies, food allergies and immunocompromised state.  Neurological:  Positive for headaches. Negative for dizziness, seizures, syncope, weakness, light-headedness and numbness.  Hematological:  Negative for adenopathy. Does not bruise/bleed easily.    Physical Exam Triage Vital Signs ED Triage Vitals  Enc Vitals Group     BP 06/18/20 0959 (!) 132/93     Pulse Rate 06/18/20 0959 75     Resp 06/18/20 0959 18     Temp 06/18/20 0959 98.4 F (36.9 C)     Temp src --      SpO2 06/18/20 0959 99 %     Weight --       Height --      Head Circumference --      Peak Flow --      Pain Score 06/18/20 0954 0     Pain Loc --      Pain Edu? --      Excl. in GC? --    No data found.  Updated Vital Signs BP (!) 132/93   Pulse 75   Temp 98.4 F (36.9 C)   Resp 18   LMP 06/11/2020 (Approximate)   SpO2 99%   Visual Acuity Right Eye Distance:   Left Eye Distance:   Bilateral Distance:    Right Eye Near:   Left Eye Near:    Bilateral Near:     Physical Exam Vitals and nursing note reviewed.  Constitutional:      General: She is awake. She is not in acute distress.    Appearance: She is well-developed and well-groomed.     Comments: She is sitting comfortably on the exam table in no acute distress.   HENT:     Head: Normocephalic and atraumatic.     Right Ear: Hearing normal.     Left Ear: Hearing normal.  Eyes:     Extraocular Movements: Extraocular movements intact.     Conjunctiva/sclera: Conjunctivae normal.  Cardiovascular:     Rate and Rhythm: Normal rate and regular rhythm.     Heart sounds: Normal heart sounds. No murmur heard. Pulmonary:     Effort: Pulmonary effort is normal. No respiratory distress.     Breath sounds: Normal breath sounds and air entry. No decreased air movement.  Abdominal:     General: Abdomen is flat.     Palpations: Abdomen is soft.     Tenderness: There is abdominal tenderness in the suprapubic area. There is no right CVA tenderness, left CVA tenderness, guarding or rebound.  Musculoskeletal:        General: Normal range of motion.     Cervical back: Normal range of motion.  Skin:    General: Skin is warm and dry.     Findings: No rash.  Neurological:     General: No focal deficit present.     Mental Status: She is alert and oriented to person, place, and time.  Psychiatric:        Mood and Affect: Mood normal.        Behavior: Behavior normal. Behavior is cooperative.        Thought Content: Thought  content normal.        Judgment: Judgment normal.      UC Treatments / Results  Labs (all labs ordered are listed, but only abnormal results are displayed) Labs Reviewed  POCT URINALYSIS DIPSTICK, ED / UC - Abnormal; Notable for the following components:      Result Value   Glucose, UA 250 (*)    Ketones, ur TRACE (*)    Protein, ur 100 (*)    Urobilinogen, UA 4.0 (*)    Nitrite POSITIVE (*)    Leukocytes,Ua LARGE (*)    All other components within normal limits  URINE CULTURE  POC URINE PREG, ED    EKG   Radiology No results found.  Procedures Procedures (including critical care time)  Medications Ordered in UC Medications - No data to display  Initial Impression / Assessment and Plan / UC Course  I have reviewed the triage vital signs and the nursing notes.  Pertinent labs & imaging results that were available during my care of the patient were reviewed by me and considered in my medical decision making (see chart for details).    Reviewed negative urine pregnancy test with patient. Urinalysis results were not available before patient was discharged and anticipated some interference with results from AZO so sent urine for culture and treated empirically for UTI. Urinalysis results did show positive WBC's, nitrites, protein, urobilinogen, ketones and sugar level was 250. Patient has not had positive glucose in the urine in the past- needs further evaluation for possible diabetes and may help to explain recurrent UTI. Patient uncertain of antibiotic for UTI that gave her migraine at a previous visit. Was not Flagyl but uncertain if Macrobid or Bactrim. Patient knows she can take PCN/Amoxicillin with no reaction. Recommend start Amoxicillin 500mg  3 times a day for 7 days. May continue AZO as directed to help with pain. Continue to push fluids/water. Follow-up pending urine culture results and with a PCP for further evaluation of glucosuria and for further testing for possible diabetes.  Final Clinical Impressions(s) / UC  Diagnoses   Final diagnoses:  Urinary frequency  Bad odor of urine  Bilateral lower abdominal cramping  Glucosuria     Discharge Instructions      Recommend start Amoxicillin 500mg  3 times a day as directed. May continue AZO as directed to help with pain. Continue to push fluids/water. Follow-up pending urine culture results.      ED Prescriptions     Medication Sig Dispense Auth. Provider   amoxicillin (AMOXIL) 500 MG capsule Take 1 capsule (500 mg total) by mouth 3 (three) times daily for 5 days. 15 capsule Bernell Sigal, , NP      PDMP not reviewed this encounter.   , NP 06/18/20 2035

## 2020-06-18 NOTE — Discharge Instructions (Addendum)
Recommend start Amoxicillin 500mg  3 times a day as directed. May continue AZO as directed to help with pain. Continue to push fluids/water. Follow-up pending urine culture results.

## 2020-06-18 NOTE — ED Triage Notes (Addendum)
Pt reports uti symptoms for 2 days (intermittent lower stomach cramps, urinary frequency, urine odor). Reports the last time she had been prescribed medication (for either UTI or BV) gave her migraines- pt does not remember the name of the medication but remembers was not Flagyl.

## 2020-06-19 LAB — URINE CULTURE
Culture: <10000 — AB
Special Requests: NORMAL

## 2020-08-01 ENCOUNTER — Ambulatory Visit (HOSPITAL_COMMUNITY)
Admission: EM | Admit: 2020-08-01 | Discharge: 2020-08-01 | Disposition: A | Discharge status: Home / Self Care | Payer: Medicaid Other | Attending: Internal Medicine | Admitting: Internal Medicine

## 2020-08-01 ENCOUNTER — Encounter (HOSPITAL_COMMUNITY): Payer: Self-pay | Admitting: *Deleted

## 2020-08-01 ENCOUNTER — Other Ambulatory Visit: Payer: Self-pay

## 2020-08-01 DIAGNOSIS — N898 Other specified noninflammatory disorders of vagina: Secondary | ICD-10-CM | POA: Diagnosis not present

## 2020-08-01 DIAGNOSIS — R300 Dysuria: Secondary | ICD-10-CM

## 2020-08-01 DIAGNOSIS — R350 Frequency of micturition: Secondary | ICD-10-CM | POA: Diagnosis not present

## 2020-08-01 LAB — POCT URINALYSIS DIPSTICK, ED / UC
Bilirubin Urine: NEGATIVE
Glucose, UA: NEGATIVE mg/dL
Ketones, ur: NEGATIVE mg/dL
Leukocytes,Ua: NEGATIVE
Nitrite: NEGATIVE
Protein, ur: NEGATIVE mg/dL
Specific Gravity, Urine: 1.02 (ref 1.005–1.030)
Urobilinogen, UA: 0.2 mg/dL (ref 0.0–1.0)
pH: 7 (ref 5.0–8.0)

## 2020-08-01 LAB — POC URINE PREG, ED: Preg Test, Ur: NEGATIVE

## 2020-08-01 MED ORDER — CLINDAMYCIN HCL 300 MG PO CAPS: 300.0000 mg | ORAL_CAPSULE | Freq: Two times a day (BID) | ORAL | 0 refills | Status: DC

## 2020-08-01 NOTE — ED Triage Notes (Signed)
Pt reports seen one month ago for UTI and still has Sx's

## 2020-08-01 NOTE — ED Provider Notes (Signed)
[facility]    CSN: [id number] Arrival date & time: 08/01/20  1324      History   Chief Complaint Chief Complaint  Patient presents with   Urinary Frequency    HPI Carmen Johnston is a 40 y.o. female.   Patient presenting today with several day history of urinary frequency and vaginal irritation.  States she was treated a month ago for a urinary tract infection with amoxicillin but the urine culture came back negative for urinary tract infection.  She thinks that the antibiotic may have given her a yeast infection, but those symptoms have since cleared up.  She started with these new symptoms again several days ago.  She denies vaginal discharge, hematuria, pelvic or abdominal pain, flank pain, fevers, nausea, vomiting.  Not trying anything over-the-counter for symptoms at this time.  No new sexual partners.   Past Medical History:  Diagnosis Date   Abnormal Pap smear    Anemia    Chlamydia    Kidney stone    Pregnancy induced hypertension    Trichomonas    Urinary tract infection     There are no problems to display for this patient.   Past Surgical History:  Procedure Laterality Date   COLPOSCOPY     INDUCED ABORTION      OB History     Gravida  8   Para  4   Term  4   Preterm  0   AB  3   Living  4      SAB  1   IAB  1   Ectopic  1   Multiple      Live Births  4            Home Medications    Prior to Admission medications   Medication Sig Start Date End Date Taking? Authorizing Provider  clindamycin (CLEOCIN) 300 MG capsule Take 1 capsule (300 mg total) by mouth 2 (two) times daily. 08/01/20  Yes Particia Nearing, PA-C  Aspirin-Acetaminophen-Caffeine (EXCEDRIN PO) Take by mouth.    [provider]  ibuprofen (ADVIL) 600 MG tablet Take 1 tablet (600 mg total) by mouth every 6 (six) hours as needed. 04/15/20   Lurline Idol, FNP  fluticasone (FLONASE) 50 MCG/ACT nasal spray Place 2 sprays into both nostrils  daily. 01/16/20 04/15/20  Lurene Shadow, PA-C  omeprazole (PRILOSEC) 40 MG capsule Take 1 capsule (40 mg total) by mouth daily. 04/16/18 11/26/18  Sherrilyn Rist, MD    Family History Family History  Problem Relation Age of Onset   Mental illness Mother    Anesthesia problems Neg Hx    Stomach cancer Neg Hx    Colon cancer Neg Hx    Pancreatic cancer Neg Hx     Social History Social History   Tobacco Use   Smoking status: Former    Packs/day: 0.25    Years: 0.00    Pack years: 0.00    Types: Cigarettes    Quit date: 01/04/2017    Years since quitting: 3.5   Smokeless tobacco: Never  Vaping Use   Vaping Use: Never used  Substance Use Topics   Alcohol use: Yes    Comment: occ   Drug use: Yes    Types: Marijuana    Comment: occasionally     Allergies   Patient has no known allergies.   Review of Systems Review of Systems Per HPI  Physical Exam Triage Vital Signs ED  Triage Vitals [08/01/20 1439]  Enc Vitals Group     BP 119/83     Pulse Rate 83     Resp 18     Temp 98.6 F (37 C)     Temp Source Oral     SpO2 100 %     Weight      Height      Head Circumference      Peak Flow      Pain Score 0     Pain Loc      Pain Edu?      Excl. in GC?    No data found.  Updated Vital Signs BP 119/83   Pulse 83   Temp 98.6 F (37 C) (Oral)   Resp 18   LMP 07/09/2020   SpO2 100%   Visual Acuity Right Eye Distance:   Left Eye Distance:   Bilateral Distance:    Right Eye Near:   Left Eye Near:    Bilateral Near:     Physical Exam Vitals and nursing note reviewed.  Constitutional:      Appearance: Normal appearance. She is not ill-appearing.  HENT:     Head: Atraumatic.     Mouth/Throat:     Mouth: Mucous membranes are moist.  Eyes:     Extraocular Movements: Extraocular movements intact.     Conjunctiva/sclera: Conjunctivae normal.  Cardiovascular:     Rate and Rhythm: Normal rate and regular rhythm.     Heart sounds: Normal heart sounds.   Pulmonary:     Effort: Pulmonary effort is normal. No respiratory distress.     Breath sounds: Normal breath sounds. No wheezing or rales.  Abdominal:     General: Bowel sounds are normal. There is no distension.     Palpations: Abdomen is soft.     Tenderness: There is no abdominal tenderness. There is no right CVA tenderness, left CVA tenderness or guarding.  Genitourinary:    Comments: GU exam deferred, self swab performed Musculoskeletal:        General: Normal range of motion.     Cervical back: Normal range of motion and neck supple.  Skin:    General: Skin is warm and dry.  Neurological:     Mental Status: She is alert and oriented to person, place, and time.  Psychiatric:        Mood and Affect: Mood normal.        Thought Content: Thought content normal.        Judgment: Judgment normal.    UC Treatments / Results  Labs (all labs ordered are listed, but only abnormal results are displayed) Labs Reviewed  POCT URINALYSIS DIPSTICK, ED / UC - Abnormal; Notable for the following components:      Result Value   Hgb urine dipstick TRACE (*)    All other components within normal limits  POC URINE PREG, ED  CERVICOVAGINAL ANCILLARY ONLY    EKG   Radiology No results found.  Procedures Procedures (including critical care time)  Medications Ordered in UC Medications - No data to display  Initial Impression / Assessment and Plan / UC Course  I have reviewed the triage vital signs and the nursing notes.  Pertinent labs & imaging results that were available during my care of the patient were reviewed by me and considered in my medical decision making (see chart for details).     Vitals and exam benign and reassuring.  UA without evidence of urinary tract  infection today and previous urine culture negative.  Suspect BV which she has had numerous times in the past.  She states that she did not do well with the Flagyl last time, it caused headaches and she could not  finish the course.  Requesting an alternative medication to be sent in.  Will trial clindamycin while awaiting vaginal swab results.  Boric acid suppositories, probiotics and safe sexual practices and good vaginal hygiene reviewed.  Follow-up for acutely worsening symptoms.  Final Clinical Impressions(s) / UC Diagnoses   Final diagnoses:  Urinary frequency  Dysuria  Vaginal irritation   Discharge Instructions   None    ED Prescriptions     Medication Sig Dispense Auth. Provider   clindamycin (CLEOCIN) 300 MG capsule Take 1 capsule (300 mg total) by mouth 2 (two) times daily. 14 capsule Particia Nearing, [place]      PDMP not reviewed this encounter.   Particia Nearing, [place] 08/01/20 [phone]

## 2020-08-01 NOTE — ED Triage Notes (Signed)
Pt called to Intake x2 with no answer

## 2020-08-02 LAB — CERVICOVAGINAL ANCILLARY ONLY
Bacterial Vaginitis (gardnerella): NEGATIVE
Candida Glabrata: NEGATIVE
Candida Vaginitis: POSITIVE — AB
Chlamydia: NEGATIVE
Comment: NEGATIVE
Comment: NEGATIVE
Comment: NEGATIVE
Comment: NEGATIVE
Comment: NEGATIVE
Comment: NORMAL
Neisseria Gonorrhea: NEGATIVE
Trichomonas: NEGATIVE

## 2020-08-03 ENCOUNTER — Telehealth (HOSPITAL_COMMUNITY): Payer: Self-pay | Admitting: Emergency Medicine

## 2020-08-03 MED ORDER — FLUCONAZOLE 150 MG PO TABS: 150.0000 mg | ORAL_TABLET | Freq: Once | ORAL | 0 refills | Status: AC

## 2020-09-10 ENCOUNTER — Other Ambulatory Visit: Payer: Self-pay

## 2020-09-10 ENCOUNTER — Emergency Department (HOSPITAL_COMMUNITY)
Admission: EM | Admit: 2020-09-10 | Discharge: 2020-09-10 | Discharge status: Against Medical Advice | Payer: Medicaid Other | Attending: Emergency Medicine | Admitting: Emergency Medicine

## 2020-09-10 DIAGNOSIS — Z5321 Procedure and treatment not carried out due to patient leaving prior to being seen by health care provider: Secondary | ICD-10-CM | POA: Insufficient documentation

## 2020-09-10 DIAGNOSIS — R0789 Other chest pain: Secondary | ICD-10-CM | POA: Diagnosis not present

## 2020-09-10 LAB — CBC
HCT: 37.6 % (ref 36.0–46.0)
Hemoglobin: 11.6 g/dL — ABNORMAL LOW (ref 12.0–15.0)
MCH: 26.4 pg (ref 26.0–34.0)
MCHC: 30.9 g/dL (ref 30.0–36.0)
MCV: 85.6 fL (ref 80.0–100.0)
Platelets: 265 10*3/uL (ref 150–400)
RBC: 4.39 MIL/uL (ref 3.87–5.11)
RDW: 14.5 % (ref 11.5–15.5)
WBC: 4.2 10*3/uL (ref 4.0–10.5)
nRBC: 0 % (ref 0.0–0.2)

## 2020-09-10 LAB — TROPONIN I (HIGH SENSITIVITY): Troponin I (High Sensitivity): <2 ng/L (ref <18)

## 2020-09-10 LAB — BASIC METABOLIC PANEL
Anion gap: 7 (ref 5–15)
BUN: 10 mg/dL (ref 6–20)
CO2: 25 mmol/L (ref 22–32)
Calcium: 9.2 mg/dL (ref 8.9–10.3)
Chloride: 102 mmol/L (ref 98–111)
Creatinine, Ser: 0.99 mg/dL (ref 0.44–1.00)
GFR, Estimated: >60 mL/min (ref >60)
Glucose, Bld: 95 mg/dL (ref 70–99)
Potassium: 4.1 mmol/L (ref 3.5–5.1)
Sodium: 134 mmol/L — ABNORMAL LOW (ref 135–145)

## 2020-09-10 LAB — I-STAT BETA HCG BLOOD, ED (MC, WL, AP ONLY): I-stat hCG, quantitative: <5 m[IU]/mL (ref <5)

## 2020-09-10 NOTE — ED Notes (Signed)
No response x2 ?

## 2020-09-10 NOTE — ED Notes (Signed)
Pt called for vital signs, no response  

## 2020-09-10 NOTE — ED Triage Notes (Signed)
Pt c/o tingling in BUE, states cheek was twitching yesterday, feels foggy. States started today, drank "A lot" of wine last night & started period yesterday, states she "bleeds heavy- 2 pads/hr." States hx decreased sensation on L side, states today is worse.

## 2020-09-12 ENCOUNTER — Emergency Department (HOSPITAL_COMMUNITY)
Admission: EM | Admit: 2020-09-12 | Discharge: 2020-09-13 | Disposition: A | Discharge status: Home / Self Care | Payer: Medicaid Other | Attending: Emergency Medicine | Admitting: Emergency Medicine

## 2020-09-12 ENCOUNTER — Other Ambulatory Visit: Payer: Self-pay

## 2020-09-12 ENCOUNTER — Encounter (HOSPITAL_COMMUNITY): Payer: Self-pay

## 2020-09-12 DIAGNOSIS — R002 Palpitations: Secondary | ICD-10-CM | POA: Insufficient documentation

## 2020-09-12 DIAGNOSIS — R0602 Shortness of breath: Secondary | ICD-10-CM | POA: Diagnosis not present

## 2020-09-12 DIAGNOSIS — Z7982 Long term (current) use of aspirin: Secondary | ICD-10-CM | POA: Insufficient documentation

## 2020-09-12 DIAGNOSIS — R202 Paresthesia of skin: Secondary | ICD-10-CM | POA: Insufficient documentation

## 2020-09-12 DIAGNOSIS — R531 Weakness: Secondary | ICD-10-CM | POA: Diagnosis not present

## 2020-09-12 DIAGNOSIS — Z87891 Personal history of nicotine dependence: Secondary | ICD-10-CM | POA: Insufficient documentation

## 2020-09-12 DIAGNOSIS — R55 Syncope and collapse: Secondary | ICD-10-CM | POA: Diagnosis not present

## 2020-09-12 DIAGNOSIS — R42 Dizziness and giddiness: Secondary | ICD-10-CM | POA: Insufficient documentation

## 2020-09-12 LAB — TROPONIN I (HIGH SENSITIVITY): Troponin I (High Sensitivity): <2 ng/L (ref <18)

## 2020-09-12 LAB — CBC WITH DIFFERENTIAL/PLATELET
Abs Immature Granulocytes: 0.01 10*3/uL (ref 0.00–0.07)
Basophils Absolute: 0.1 10*3/uL (ref 0.0–0.1)
Basophils Relative: 1 %
Eosinophils Absolute: 0.3 10*3/uL (ref 0.0–0.5)
Eosinophils Relative: 6 %
HCT: 38.1 % (ref 36.0–46.0)
Hemoglobin: 11.8 g/dL — ABNORMAL LOW (ref 12.0–15.0)
Immature Granulocytes: 0 %
Lymphocytes Relative: 43 %
Lymphs Abs: 2.2 10*3/uL (ref 0.7–4.0)
MCH: 26.6 pg (ref 26.0–34.0)
MCHC: 31 g/dL (ref 30.0–36.0)
MCV: 85.8 fL (ref 80.0–100.0)
Monocytes Absolute: 0.3 10*3/uL (ref 0.1–1.0)
Monocytes Relative: 6 %
Neutro Abs: 2.2 10*3/uL (ref 1.7–7.7)
Neutrophils Relative %: 44 %
Platelets: 270 10*3/uL (ref 150–400)
RBC: 4.44 MIL/uL (ref 3.87–5.11)
RDW: 14.6 % (ref 11.5–15.5)
WBC: 5.1 10*3/uL (ref 4.0–10.5)
nRBC: 0 % (ref 0.0–0.2)

## 2020-09-12 LAB — BASIC METABOLIC PANEL
Anion gap: 8 (ref 5–15)
BUN: 10 mg/dL (ref 6–20)
CO2: 25 mmol/L (ref 22–32)
Calcium: 9.8 mg/dL (ref 8.9–10.3)
Chloride: 105 mmol/L (ref 98–111)
Creatinine, Ser: 0.91 mg/dL (ref 0.44–1.00)
GFR, Estimated: >60 mL/min (ref >60)
Glucose, Bld: 94 mg/dL (ref 70–99)
Potassium: 3.7 mmol/L (ref 3.5–5.1)
Sodium: 138 mmol/L (ref 135–145)

## 2020-09-12 LAB — I-STAT BETA HCG BLOOD, ED (MC, WL, AP ONLY): I-stat hCG, quantitative: <5 m[IU]/mL (ref <5)

## 2020-09-12 NOTE — ED Provider Notes (Signed)
Emergency Medicine Provider Triage Evaluation Note  Carmen Johnston , a 40 y.o. female  was evaluated in triage.  Pt complains of palpitations that have been intermittent since this morning. History of same. Denies chest pain.  Patient also endorses generalized weakness 2 days ago.  Patient started her menstrual cycle 3 days ago.  Originally she was passing small clots however, patient states her menstrual cycles slowed down today.  History of anemia.  Denies shortness of breath.  Review of Systems  Positive: Palpitations, weakness Negative: Chest pain  Physical Exam  BP (!) 132/99 (BP Location: Left Arm)   Pulse 72   Temp 98.3 F (36.8 C) (Oral)   Resp 16   Ht 5\' 8"  (1.727 m)   Wt 90.7 kg   SpO2 98%   BMI 30.41 kg/m  Gen:   Awake, no distress   Resp:  Normal effort  MSK:   Moves extremities without difficulty  Other:    Medical Decision Making  Medically screening exam initiated at 8:38 PM.  Appropriate orders placed.  MERNA BALDI was informed that the remainder of the evaluation will be completed by another provider, this initial triage assessment does not replace that evaluation, and the importance of remaining in the ED until their evaluation is complete.  Labs EKG   Luther Parody 09/12/20 2039    2040, MD 09/12/20 [phone]

## 2020-09-12 NOTE — ED Triage Notes (Signed)
Pt reports palpitations that began this morning. Denies pain. Seen at [facility] 2 days ago for generalized weakness.

## 2020-09-13 ENCOUNTER — Emergency Department (HOSPITAL_COMMUNITY): Payer: Medicaid Other

## 2020-09-13 DIAGNOSIS — R0602 Shortness of breath: Secondary | ICD-10-CM | POA: Diagnosis not present

## 2020-09-13 DIAGNOSIS — R002 Palpitations: Secondary | ICD-10-CM | POA: Diagnosis not present

## 2020-09-13 DIAGNOSIS — R531 Weakness: Secondary | ICD-10-CM | POA: Diagnosis not present

## 2020-09-13 LAB — D-DIMER, QUANTITATIVE: D-Dimer, Quant: 0.36 ug/mL-FEU (ref 0.00–0.50)

## 2020-09-13 LAB — TROPONIN I (HIGH SENSITIVITY): Troponin I (High Sensitivity): <2 ng/L (ref <18)

## 2020-09-13 NOTE — ED Notes (Signed)
Patient did not answer when called for a room.

## 2020-09-13 NOTE — Discharge Instructions (Signed)
There is no evidence of heart attack or blood clot in the lung.  Your thyroid test will be available online tomorrow for you to follow-up.  Establish care with a primary doctor.  Reduce your caffeine intake.  Return to the ED with chest pain, shortness of breath, any other concerns.

## 2020-09-13 NOTE — ED Provider Notes (Signed)
[facility]-EMERGENCY DEPT Provider Note   CSN: [id number] Arrival date & time: 09/12/20  1954     History Chief Complaint  Patient presents with   Palpitations    Carmen Johnston is a 40 y.o. female.  Patient with a history of anemia, previous kidney stones presenting with palpitations generalized weakness for the past 2 days.  States she is having constant palpitations since this morning.  Has had them intermittently in the past but never lasting this long.  Feels generally weak.  Several days ago she had numbness and tingling to both of her hands and twitching of her face.  She went to [facility] but left without being seen.  She still has some generalized weakness but nothing focal.  No unilateral numbness, tingling, weakness.  No difficulty speaking or difficulty swallowing.  Currently on her menstrual cycle which she says sometimes causes her to have headaches and weakness. Still feels lightheaded and feels like she is going to pass out.  No recent vomiting or diarrhea.  No black or bloody stools.  No fever, cough, chest pain or shortness of breath.  No headache currently  The history is provided by the patient.  Palpitations Associated symptoms: weakness   Associated symptoms: no chest pain, no dizziness, no nausea, no shortness of breath and no vomiting       Past Medical History:  Diagnosis Date   Abnormal Pap smear    Anemia    Chlamydia    Kidney stone    Pregnancy induced hypertension    Trichomonas    Urinary tract infection     There are no problems to display for this patient.   Past Surgical History:  Procedure Laterality Date   COLPOSCOPY     INDUCED ABORTION       OB History     Gravida  8   Para  4   Term  4   Preterm  0   AB  3   Living  4      SAB  1   IAB  1   Ectopic  1   Multiple      Live Births  4           Family History  Problem Relation Age of Onset   Mental illness Mother    Anesthesia  problems Neg Hx    Stomach cancer Neg Hx    Colon cancer Neg Hx    Pancreatic cancer Neg Hx     Social History   Tobacco Use   Smoking status: Former    Packs/day: 0.25    Years: 0.00    Pack years: 0.00    Types: Cigarettes    Quit date: 01/04/2017    Years since quitting: 3.6   Smokeless tobacco: Never  Vaping Use   Vaping Use: Never used  Substance Use Topics   Alcohol use: Yes    Comment: occ   Drug use: Yes    Types: Marijuana    Comment: occasionally    Home Medications Prior to Admission medications   Medication Sig Start Date End Date Taking? Authorizing Provider  Aspirin-Acetaminophen-Caffeine (EXCEDRIN PO) Take by mouth.    [provider]  clindamycin (CLEOCIN) 300 MG capsule Take 1 capsule (300 mg total) by mouth 2 (two) times daily. 08/01/20   Particia Nearing, PA-C  ibuprofen (ADVIL) 600 MG tablet Take 1 tablet (600 mg total) by mouth every 6 (six) hours as needed. 04/15/20  Lurline Idol, FNP  fluticasone (FLONASE) 50 MCG/ACT nasal spray Place 2 sprays into both nostrils daily. 01/16/20 04/15/20  Lurene Shadow, PA-C  omeprazole (PRILOSEC) 40 MG capsule Take 1 capsule (40 mg total) by mouth daily. 04/16/18 11/26/18  Sherrilyn Rist, MD    Allergies    Other  Review of Systems   Review of Systems  Constitutional:  Positive for fatigue. Negative for activity change, appetite change and fever.  HENT:  Negative for congestion and rhinorrhea.   Respiratory:  Negative for chest tightness and shortness of breath.   Cardiovascular:  Positive for palpitations. Negative for chest pain.  Gastrointestinal:  Negative for abdominal pain, nausea and vomiting.  Genitourinary:  Negative for dysuria and hematuria.  Musculoskeletal:  Negative for arthralgias and myalgias.  Skin:  Negative for rash.  Neurological:  Positive for weakness and light-headedness. Negative for dizziness and headaches.   all other systems are negative except as noted in the HPI  and PMH.   Physical Exam Updated Vital Signs BP (!) 132/97 (BP Location: Left Arm)   Pulse 66   Temp 98.3 F (36.8 C) (Oral)   Resp 17   Ht 5\' 8"  (1.727 m)   Wt 90.7 kg   SpO2 94%   BMI 30.41 kg/m   Physical Exam Vitals and nursing note reviewed.  Constitutional:      General: She is not in acute distress.    Appearance: She is well-developed.  HENT:     Head: Normocephalic and atraumatic.     Mouth/Throat:     Pharynx: No oropharyngeal exudate.  Eyes:     Conjunctiva/sclera: Conjunctivae normal.     Pupils: Pupils are equal, round, and reactive to light.  Neck:     Comments: No meningismus. Cardiovascular:     Rate and Rhythm: Normal rate and regular rhythm.     Heart sounds: Normal heart sounds. No murmur heard. Pulmonary:     Effort: Pulmonary effort is normal. No respiratory distress.     Breath sounds: Normal breath sounds.  Abdominal:     Palpations: Abdomen is soft.     Tenderness: There is no abdominal tenderness. There is no guarding or rebound.  Musculoskeletal:        General: No tenderness. Normal range of motion.     Cervical back: Normal range of motion and neck supple.  Skin:    General: Skin is warm.  Neurological:     Mental Status: She is alert and oriented to person, place, and time.     Cranial Nerves: No cranial nerve deficit.     Motor: No abnormal muscle tone.     Coordination: Coordination normal.     Comments: CN 2-12 intact, no ataxia on finger to nose, no nystagmus, 5/5 strength throughout, no pronator drift, Romberg negative, normal gait.   Psychiatric:        Behavior: Behavior normal.    ED Results / Procedures / Treatments   Labs (all labs ordered are listed, but only abnormal results are displayed) Labs Reviewed  CBC WITH DIFFERENTIAL/PLATELET - Abnormal; Notable for the following components:      Result Value   Hemoglobin 11.8 (*)    All other components within normal limits  BASIC METABOLIC PANEL  D-DIMER, QUANTITATIVE   TSH  I-STAT BETA HCG BLOOD, ED ([facility], [facility], [facility] ONLY)  TROPONIN I (HIGH SENSITIVITY)  TROPONIN I (HIGH SENSITIVITY)    EKG EKG Interpretation  Date/Time:  Sunday September 12 2020 20:24:49  EDT Ventricular Rate:  63 PR Interval:  192 QRS Duration: 103 QT Interval:  387 QTC Calculation: 397 R Axis:   42 Text Interpretation: Sinus rhythm No significant change was found Confirmed by Glynn Octave [phone]) on 09/13/2020 12:56:35 AM  Radiology DG Chest 2 View  Result Date: 09/13/2020 CLINICAL DATA:  Shortness of breath EXAM: CHEST - 2 VIEW COMPARISON:  05/30/2020 FINDINGS: Lungs are clear.  No pleural effusion or pneumothorax. The heart is normal in size. Visualized osseous structures are within normal limits. IMPRESSION: Normal chest radiographs. Electronically Signed   By: Charline Bills M.D.   On: 09/13/2020 02:50   CT HEAD WO CONTRAST ( )  Result Date: 09/13/2020 CLINICAL DATA:  Palpitations, weakness EXAM: CT HEAD WITHOUT CONTRAST TECHNIQUE: Contiguous axial images were obtained from the base of the skull through the vertex without intravenous contrast. COMPARISON:  01/04/2018 FINDINGS: Brain: No evidence of acute infarction, hemorrhage, hydrocephalus, extra-axial collection or mass lesion/mass effect. Vascular: No hyperdense vessel or unexpected calcification. Skull: Normal. Negative for fracture or focal lesion. Sinuses/Orbits: The visualized paranasal sinuses are essentially clear. The mastoid air cells are unopacified. Other: None. IMPRESSION: Normal head CT. Electronically Signed   By: Charline Bills M.D.   On: 09/13/2020 03:09    Procedures Procedures   Medications Ordered in ED Medications - No data to display  ED Course  I have reviewed the triage vital signs and the nursing notes.  Pertinent labs & imaging results that were available during my care of the patient were reviewed by me and considered in my medical decision making (see chart for details).    MDM  Rules/Calculators/A&P                          Palpitations and generalized weakness.  Nonfocal neurological exam.  Several days ago had near syncope with generalized weakness and tingling in her hands. EKG is sinus rhythm without acute ST changes.  Labs show stable hemoglobin HCG negative. Low suspicion for ACS or PE.  Chest x-ray negative.  Troponin negative x2.  D-dimer negative.  Low suspicion for PE or ACS.  Does have transient pulse ox saturation 88% which seems to be spurious. Other values have been normal.  Patient feels improved.  TSH is still pending.  Discussed that she can follow-up with this result tomorrow online.  Establish care with a primary doctor, reduce caffeine intake.  Return to the ED with chest pain, shortness of breath, unilateral weakness, numbness, tingling, any other concerns.   Final Clinical Impression(s) / ED Diagnoses Final diagnoses:  Palpitations    Rx / DC Orders ED Discharge Orders     None        Usha Slager, Jeannett Senior, MD 09/13/20 [phone]

## 2020-09-14 ENCOUNTER — Telehealth: Payer: Self-pay

## 2020-09-14 NOTE — Telephone Encounter (Signed)
Transition Care Management Follow-up Telephone Call Date of discharge and from where: 09/13/2020-[facility] How have you been since you were released from the hospital? Patient stated she is doing fine.  Any questions or concerns? No  Items Reviewed: Did the pt receive and understand the discharge instructions provided? Yes  Medications obtained and verified?  N/A Other? No  Any new allergies since your discharge? No  Dietary orders reviewed? N/A Do you have support at home? Yes   Home Care and Equipment/Supplies: Were home health services ordered? not applicable If so, what is the name of the agency? N/A  Has the agency set up a time to come to the patient's home? not applicable Were any new equipment or medical supplies ordered?  No What is the name of the medical supply agency? N/A Were you able to get the supplies/equipment? not applicable Do you have any questions related to the use of the equipment or supplies? No  Functional Questionnaire: (I = Independent and D = Dependent) ADLs: I  Bathing/Dressing- I  Meal Prep- I  Eating- I  Maintaining continence- I  Transferring/Ambulation- I  Managing Meds- I  Follow up appointments reviewed:  PCP Hospital f/u appt confirmed? No   Specialist Hospital f/u appt confirmed? No   Are transportation arrangements needed? No  If their condition worsens, is the pt aware to call PCP or go to the Emergency Dept.? Yes Was the patient provided with contact information for the PCP's office or ED? Yes Was to pt encouraged to call back with questions or concerns? Yes

## 2020-12-23 DIAGNOSIS — R87619 Unspecified abnormal cytological findings in specimens from cervix uteri: Secondary | ICD-10-CM | POA: Diagnosis not present

## 2020-12-23 DIAGNOSIS — Z01419 Encounter for gynecological examination (general) (routine) without abnormal findings: Secondary | ICD-10-CM | POA: Diagnosis not present

## 2020-12-23 DIAGNOSIS — Z3009 Encounter for other general counseling and advice on contraception: Secondary | ICD-10-CM | POA: Diagnosis not present

## 2020-12-23 DIAGNOSIS — R87611 Atypical squamous cells cannot exclude high grade squamous intraepithelial lesion on cytologic smear of cervix (ASC-H): Secondary | ICD-10-CM | POA: Diagnosis not present

## 2020-12-23 DIAGNOSIS — Z8742 Personal history of other diseases of the female genital tract: Secondary | ICD-10-CM

## 2020-12-23 DIAGNOSIS — IMO0002 Reserved for concepts with insufficient information to code with codable children: Secondary | ICD-10-CM

## 2020-12-23 DIAGNOSIS — Z0389 Encounter for observation for other suspected diseases and conditions ruled out: Secondary | ICD-10-CM | POA: Diagnosis not present

## 2020-12-23 HISTORY — DX: Reserved for concepts with insufficient information to code with codable children: IMO0002

## 2020-12-23 HISTORY — DX: Personal history of other diseases of the female genital tract: Z87.42

## 2021-01-06 ENCOUNTER — Encounter: Payer: Self-pay | Admitting: Radiology

## 2021-01-10 DIAGNOSIS — D259 Leiomyoma of uterus, unspecified: Secondary | ICD-10-CM | POA: Diagnosis not present

## 2021-01-10 DIAGNOSIS — N921 Excessive and frequent menstruation with irregular cycle: Secondary | ICD-10-CM | POA: Diagnosis not present

## 2021-01-11 ENCOUNTER — Other Ambulatory Visit: Payer: Self-pay | Admitting: *Deleted

## 2021-01-11 NOTE — Patient Outreach (Signed)
Care Coordination  01/11/2021  Carmen Johnston 05/12/1980 [id number]  Successful telephone outreach to patient. Reviewed purpose of high risk Managed Medicaid chronic disease management program and role of managed Medicaid team members. Patient states she has no known chronic disease states although she does report she recently received an abnormal PAP smear result and will establish care with a gynecologist on 03/15/21. She says she intermittent back pian x 2 years that is usually from prolonged standing or upon arising from her bed if she's  slept in an awkward position. She says she does not have a primary care doctor and would like help in establishing one. She says she is enrolled in the [facility] health plan but does not have her plan member card and did not receive a member's handbook.   Plan: Provided patient with the toll free member services phone number for her Healthy [facility] plan and her member number; encouraged her to call to get assistance with establishing a primary care provider.  Advised patient this RNCM will mail her a summary of benefits for her Healthy [facility] plan and a [facility] Care Management spiral bound day planner with this RNCM's business card with contact information.  Encouraged her to call this RNCM if she wishes to enroll in the high risk Managed Medicaid disease management program once she has established with a primary care provider and needs assistance with disease management and/or care management issues.  Patet voiced understanding and compliance of plan.   Cranford Mon RN, CCM, CDCES [place]   [facility] Care Management Coordinator - Managed [place] High Risk [phone]

## 2021-01-25 ENCOUNTER — Ambulatory Visit (HOSPITAL_COMMUNITY)
Admission: EM | Admit: 2021-01-25 | Discharge: 2021-01-25 | Disposition: A | Discharge status: Home / Self Care | Payer: Medicaid Other | Attending: Student | Admitting: Student

## 2021-01-25 ENCOUNTER — Other Ambulatory Visit: Payer: Self-pay

## 2021-01-25 ENCOUNTER — Encounter (HOSPITAL_COMMUNITY): Payer: Self-pay | Admitting: Emergency Medicine

## 2021-01-25 DIAGNOSIS — N3001 Acute cystitis with hematuria: Secondary | ICD-10-CM | POA: Diagnosis not present

## 2021-01-25 LAB — POCT URINALYSIS DIPSTICK, ED / UC
Bilirubin Urine: NEGATIVE
Glucose, UA: NEGATIVE mg/dL
Ketones, ur: NEGATIVE mg/dL
Nitrite: NEGATIVE
Protein, ur: NEGATIVE mg/dL
Specific Gravity, Urine: 1.015 (ref 1.005–1.030)
Urobilinogen, UA: 0.2 mg/dL (ref 0.0–1.0)
pH: 7.5 (ref 5.0–8.0)

## 2021-01-25 LAB — POC URINE PREG, ED: Preg Test, Ur: NEGATIVE

## 2021-01-25 MED ORDER — CEPHALEXIN 500 MG PO CAPS: 500.0000 mg | ORAL_CAPSULE | Freq: Four times a day (QID) | ORAL | 0 refills | Status: DC

## 2021-01-25 NOTE — Discharge Instructions (Addendum)
-  Start the antibiotic: Keflex, 4x daily x5 days. You can take this with food if you have a sensitive stomach. -Drink plenty of fluids -Follow-up if symptoms getting worse- abdominal pain, new back pain, etc.

## 2021-01-25 NOTE — ED Triage Notes (Signed)
Lower abdominal discomfort, frequent urination, irritated and small amounts of urine with each void.  Patient took 2 old antibiotics thought to be clindamycin.

## 2021-01-25 NOTE — ED Provider Notes (Signed)
[place]    CSN: [id number] Arrival date & time: 01/25/21  1307      History   Chief Complaint Chief Complaint  Patient presents with   Urinary Tract Infection    HPI Carmen Johnston is a 41 y.o. female presenting with concern for UTI. History UTI. Describes suprapubic pain, frequent urinary with small amounts of urine in each void. Took two pills of clindamycin she had at home without improvement. Denies hematuria,  urgency, flank pain, n/v/d, fevers/chills, abdnormal vaginal discharge.  States she finished her period 3 days ago.  She also had intercourse with her husband about 3 days ago  HPI  Past Medical History:  Diagnosis Date   Abnormal Pap smear    Anemia    Chlamydia    Kidney stone    Pregnancy induced hypertension    Trichomonas    Urinary tract infection     There are no problems to display for this patient.   Past Surgical History:  Procedure Laterality Date   COLPOSCOPY     INDUCED ABORTION      OB History     Gravida  8   Para  4   Term  4   Preterm  0   AB  3   Living  4      SAB  1   IAB  1   Ectopic  1   Multiple      Live Births  4            Home Medications    Prior to Admission medications   Medication Sig Start Date End Date Taking? Authorizing Provider  cephALEXin (KEFLEX) 500 MG capsule Take 1 capsule (500 mg total) by mouth 4 (four) times daily. 01/25/21  Yes Hazel Sams, PA-C  fluticasone (FLONASE) 50 MCG/ACT nasal spray Place 2 sprays into both nostrils daily. 01/16/20 04/15/20  Noe Gens, PA-C  omeprazole (PRILOSEC) 40 MG capsule Take 1 capsule (40 mg total) by mouth daily. 04/16/18 11/26/18  Doran Stabler, MD    Family History Family History  Problem Relation Age of Onset   Mental illness Mother    Anesthesia problems Neg Hx    Stomach cancer Neg Hx    Colon cancer Neg Hx    Pancreatic cancer Neg Hx     Social History Social History   Tobacco Use   Smoking status: Former     Packs/day: 0.25    Years: 0.00    Pack years: 0.00    Types: Cigarettes    Quit date: 01/04/2017    Years since quitting: 4.0   Smokeless tobacco: Never  Vaping Use   Vaping Use: Never used  Substance Use Topics   Alcohol use: Yes    Comment: occ   Drug use: Yes    Types: Marijuana    Comment: occasionally     Allergies   Patient has no known allergies.   Review of Systems Review of Systems  Constitutional:  Negative for appetite change, chills, diaphoresis and fever.  Respiratory:  Negative for shortness of breath.   Cardiovascular:  Negative for chest pain.  Gastrointestinal:  Positive for abdominal pain. Negative for blood in stool, constipation, diarrhea, nausea and vomiting.  Genitourinary:  Positive for frequency. Negative for decreased urine volume, difficulty urinating, dysuria, flank pain, genital sores, hematuria and urgency.  Musculoskeletal:  Negative for back pain.  Neurological:  Negative for dizziness, weakness and light-headedness.  All other systems  reviewed and are negative.   Physical Exam Triage Vital Signs ED Triage Vitals [01/25/21 1444]  Enc Vitals Group     BP      Pulse      Resp      Temp      Temp src      SpO2      Weight      Height      Head Circumference      Peak Flow      Pain Score 7     Pain Loc      Pain Edu?      Excl. in [place]?    No data found.  Updated Vital Signs BP 115/87 (BP Location: Left Arm)    Pulse 76    Temp 98 F (36.7 C) (Oral)    Resp 18    LMP 01/21/2021 (Approximate)    SpO2 100%   Visual Acuity Right Eye Distance:   Left Eye Distance:   Bilateral Distance:    Right Eye Near:   Left Eye Near:    Bilateral Near:     Physical Exam Vitals reviewed.  Constitutional:      General: She is not in acute distress.    Appearance: Normal appearance. She is not ill-appearing.  HENT:     Head: Normocephalic and atraumatic.     Mouth/Throat:     Mouth: Mucous membranes are moist.     Comments: Moist  mucous membranes Eyes:     Extraocular Movements: Extraocular movements intact.     Pupils: Pupils are equal, round, and reactive to light.  Cardiovascular:     Rate and Rhythm: Normal rate and regular rhythm.     Heart sounds: Normal heart sounds.  Pulmonary:     Effort: Pulmonary effort is normal.     Breath sounds: Normal breath sounds. No wheezing, rhonchi or rales.  Abdominal:     General: Bowel sounds are normal. There is no distension.     Palpations: Abdomen is soft. There is no mass.     Tenderness: There is abdominal tenderness in the suprapubic area. There is no right CVA tenderness, left CVA tenderness, guarding or rebound. Negative signs include Murphy's sign, Rovsing's sign and McBurney's sign.  Skin:    General: Skin is warm.     Capillary Refill: Capillary refill takes less than 2 seconds.     Comments: Good skin turgor  Neurological:     General: No focal deficit present.     Mental Status: She is alert and oriented to person, place, and time.  Psychiatric:        Mood and Affect: Mood normal.        Behavior: Behavior normal.     UC Treatments / Results  Labs (all labs ordered are listed, but only abnormal results are displayed) Labs Reviewed  POCT URINALYSIS DIPSTICK, ED / UC - Abnormal; Notable for the following components:      Result Value   Hgb urine dipstick SMALL (*)    Leukocytes,Ua MODERATE (*)    All other components within normal limits  URINE CULTURE  POC URINE PREG, ED    EKG   Radiology No results found.  Procedures Procedures (including critical care time)  Medications Ordered in UC Medications - No data to display  Initial Impression / Assessment and Plan / UC Course  I have reviewed the triage vital signs and the nursing notes.  Pertinent labs & imaging results that were  available during my care of the patient were reviewed by me and considered in my medical decision making (see chart for details).     This patient is a  very pleasant 41 y.o. year old female presenting with acute cystitis. Afebrile, nontachy.   She has already taken two pills of clindamycin that she had on hand at home. UA with small blood and moderate leuk.  U-preg negative.   Will treat with keflex pending culture.  ED return precautions discussed. Patient verbalizes understanding and agreement.    Final Clinical Impressions(s) / UC Diagnoses   Final diagnoses:  Acute cystitis with hematuria     Discharge Instructions      -Start the antibiotic: Keflex, 4x daily x5 days. You can take this with food if you have a sensitive stomach. -Drink plenty of fluids -Follow-up if symptoms getting worse- abdominal pain, new back pain, etc.      ED Prescriptions     Medication Sig Dispense Auth. Provider   cephALEXin (KEFLEX) 500 MG capsule Take 1 capsule (500 mg total) by mouth 4 (four) times daily. 20 capsule Hazel Sams, PA-C      PDMP not reviewed this encounter.   Hazel Sams, PA-C 01/25/21 1512

## 2021-01-26 LAB — URINE CULTURE

## 2021-02-21 ENCOUNTER — Encounter (HOSPITAL_COMMUNITY): Payer: Self-pay | Admitting: *Deleted

## 2021-02-21 ENCOUNTER — Other Ambulatory Visit: Payer: Self-pay

## 2021-02-21 ENCOUNTER — Ambulatory Visit (HOSPITAL_COMMUNITY)
Admission: EM | Admit: 2021-02-21 | Discharge: 2021-02-21 | Disposition: A | Discharge status: Home / Self Care | Payer: Medicaid Other | Attending: Family Medicine | Admitting: Family Medicine

## 2021-02-21 DIAGNOSIS — R300 Dysuria: Secondary | ICD-10-CM | POA: Insufficient documentation

## 2021-02-21 DIAGNOSIS — N760 Acute vaginitis: Secondary | ICD-10-CM | POA: Insufficient documentation

## 2021-02-21 LAB — POCT URINALYSIS DIPSTICK, ED / UC
Bilirubin Urine: NEGATIVE
Glucose, UA: NEGATIVE mg/dL
Ketones, ur: NEGATIVE mg/dL
Nitrite: NEGATIVE
Protein, ur: NEGATIVE mg/dL
Specific Gravity, Urine: 1.01 (ref 1.005–1.030)
Urobilinogen, UA: 0.2 mg/dL (ref 0.0–1.0)
pH: 7 (ref 5.0–8.0)

## 2021-02-21 LAB — POC URINE PREG, ED: Preg Test, Ur: NEGATIVE

## 2021-02-21 MED ORDER — FLUCONAZOLE 150 MG PO TABS: 150.0000 mg | ORAL_TABLET | Freq: Once | ORAL | 0 refills | Status: AC

## 2021-02-21 NOTE — ED Triage Notes (Signed)
Reports Sx's 5 days ago vag DC and dysuria

## 2021-02-21 NOTE — Discharge Instructions (Addendum)
Urinalysis did have a little bit of white blood cells.  Pregnancy test is negative.  Take fluconazole 150 mg, 1 tab 1 time for possible yeast infection  Staff will call you if anything on the swab comes positive that needs different treatment

## 2021-02-21 NOTE — ED Provider Notes (Signed)
[place]    CSN: [id number] Arrival date & time: 02/21/21  1216      History   Chief Complaint Chief Complaint  Patient presents with   Vaginal Discharge   Dysuria    HPI Carmen Johnston is a 41 y.o. female.    Vaginal Discharge Associated symptoms: dysuria   Dysuria Associated symptoms: vaginal discharge   Here for vaginal irritation and itching but is been going on for about a week or 10 days.  At first she had a cottage cheese type discharge but that is gone away since she used an over-the-counter yeast treatment.  She did then have her period, and has felt irritated since the period began.  She has a little bit of dysuria sometimes, and has sometimes had some suprapubic pain.  No fever or chills, and no vomiting or diarrhea  Past Medical History:  Diagnosis Date   Abnormal Pap smear    Anemia    Chlamydia    Trichomonas    Urinary tract infection     There are no problems to display for this patient.   Past Surgical History:  Procedure Laterality Date   COLPOSCOPY     INDUCED ABORTION      OB History     Gravida  8   Para  4   Term  4   Preterm  0   AB  3   Living  4      SAB  1   IAB  1   Ectopic  1   Multiple      Live Births  4            Home Medications    Prior to Admission medications   Medication Sig Start Date End Date Taking? Authorizing Provider  fluconazole (DIFLUCAN) 150 MG tablet Take 1 tablet (150 mg total) by mouth once for 1 dose. 02/21/21 02/21/21 Yes Libni Fusaro, Gwenlyn Perking, MD  fluticasone (FLONASE) 50 MCG/ACT nasal spray Place 2 sprays into both nostrils daily. 01/16/20 04/15/20  Noe Gens, PA-C  omeprazole (PRILOSEC) 40 MG capsule Take 1 capsule (40 mg total) by mouth daily. 04/16/18 11/26/18  Doran Stabler, MD    Family History Family History  Problem Relation Age of Onset   Mental illness Mother    Anesthesia problems Neg Hx    Stomach cancer Neg Hx    Colon cancer Neg Hx    Pancreatic  cancer Neg Hx     Social History Social History   Tobacco Use   Smoking status: Former    Packs/day: 0.25    Years: 0.00    Pack years: 0.00    Types: Cigarettes    Quit date: 01/04/2017    Years since quitting: 4.1   Smokeless tobacco: Never  Vaping Use   Vaping Use: Never used  Substance Use Topics   Alcohol use: Yes    Comment: occ   Drug use: Yes    Types: Marijuana    Comment: occasionally     Allergies   Patient has no known allergies.   Review of Systems Review of Systems  Genitourinary:  Positive for dysuria and vaginal discharge.    Physical Exam Triage Vital Signs ED Triage Vitals  Enc Vitals Group     BP 02/21/21 1407 (!) 130/101     Pulse Rate 02/21/21 1407 65     Resp 02/21/21 1407 18     Temp 02/21/21 1407 98.1 F (36.7 C)  Temp src --      SpO2 02/21/21 1342 98 %     Weight --      Height --      Head Circumference --      Peak Flow --      Pain Score 02/21/21 1404 0     Pain Loc --      Pain Edu? --      Excl. in [place]? --    No data found.  Updated Vital Signs BP (!) 130/101    Pulse 65    Temp 98.1 F (36.7 C)    Resp 18    LMP 02/20/2021    SpO2 98%   Visual Acuity Right Eye Distance:   Left Eye Distance:   Bilateral Distance:    Right Eye Near:   Left Eye Near:    Bilateral Near:     Physical Exam Vitals reviewed.  Constitutional:      General: She is not in acute distress.    Appearance: She is not ill-appearing, toxic-appearing or diaphoretic.  HENT:     Mouth/Throat:     Mouth: Mucous membranes are moist.     Pharynx: No oropharyngeal exudate or posterior oropharyngeal erythema.  Eyes:     Extraocular Movements: Extraocular movements intact.     Conjunctiva/sclera: Conjunctivae normal.     Pupils: Pupils are equal, round, and reactive to light.  Cardiovascular:     Rate and Rhythm: Normal rate and regular rhythm.     Heart sounds: No murmur heard. Pulmonary:     Effort: Pulmonary effort is normal.     Breath  sounds: Normal breath sounds.  Abdominal:     Palpations: Abdomen is soft. There is no mass.     Tenderness: There is no abdominal tenderness.  Musculoskeletal:     Cervical back: Neck supple.  Lymphadenopathy:     Cervical: No cervical adenopathy.  Skin:    Coloration: Skin is not jaundiced or pale.  Neurological:     Mental Status: She is alert and oriented to person, place, and time.  Psychiatric:        Behavior: Behavior normal.     UC Treatments / Results  Labs (all labs ordered are listed, but only abnormal results are displayed) Labs Reviewed  POCT URINALYSIS DIPSTICK, ED / UC - Abnormal; Notable for the following components:      Result Value   Hgb urine dipstick TRACE (*)    Leukocytes,Ua SMALL (*)    All other components within normal limits  URINE CULTURE  POC URINE PREG, ED  CERVICOVAGINAL ANCILLARY ONLY    EKG   Radiology No results found.  Procedures Procedures (including critical care time)  Medications Ordered in UC Medications - No data to display  Initial Impression / Assessment and Plan / UC Course  I have reviewed the triage vital signs and the nursing notes.  Pertinent labs & imaging results that were available during my care of the patient were reviewed by me and considered in my medical decision making (see chart for details).     UPT is negative.  Urinalysis shows trace of blood and a small amount of leukocytes.  Swab for STI done.  Staff will call her if anything is positive that still needs treatment.  We will go and treat empirically with one-time dose of Diflucan.  Urine culture also will be sent Final Clinical Impressions(s) / UC Diagnoses   Final diagnoses:  Dysuria  Acute vaginitis  Discharge Instructions      Urinalysis did have a little bit of white blood cells.  Pregnancy test is negative.  Take fluconazole 150 mg, 1 tab 1 time for possible yeast infection  Staff will call you if anything on the swab comes  positive that needs different treatment     ED Prescriptions     Medication Sig Dispense Auth. Provider   fluconazole (DIFLUCAN) 150 MG tablet Take 1 tablet (150 mg total) by mouth once for 1 dose. 1 tablet Windy Carina Gwenlyn Perking, MD      PDMP not reviewed this encounter.   Barrett Henle, MD 02/21/21 1440

## 2021-02-21 NOTE — ED Triage Notes (Addendum)
Error ,information on Pt entered in error

## 2021-02-22 ENCOUNTER — Telehealth (HOSPITAL_COMMUNITY): Payer: Self-pay | Admitting: Emergency Medicine

## 2021-02-22 LAB — CERVICOVAGINAL ANCILLARY ONLY
Bacterial Vaginitis (gardnerella): NEGATIVE
Candida Glabrata: NEGATIVE
Candida Vaginitis: NEGATIVE
Chlamydia: NEGATIVE
Comment: NEGATIVE
Comment: NEGATIVE
Comment: NEGATIVE
Comment: NEGATIVE
Comment: NEGATIVE
Comment: NORMAL
Neisseria Gonorrhea: NEGATIVE
Trichomonas: POSITIVE — AB

## 2021-02-22 MED ORDER — METRONIDAZOLE 500 MG PO TABS: 500.0000 mg | ORAL_TABLET | Freq: Two times a day (BID) | ORAL | 0 refills | Status: DC

## 2021-02-22 MED ORDER — TINIDAZOLE 500 MG PO TABS: 2.0000 g | ORAL_TABLET | Freq: Once | ORAL | 0 refills | Status: AC

## 2021-02-23 LAB — URINE CULTURE: Culture: >=100000 — AB

## 2021-03-11 ENCOUNTER — Other Ambulatory Visit: Payer: Self-pay

## 2021-03-11 ENCOUNTER — Encounter (HOSPITAL_COMMUNITY): Payer: Self-pay

## 2021-03-11 ENCOUNTER — Telehealth (HOSPITAL_COMMUNITY): Payer: Self-pay | Admitting: Urgent Care

## 2021-03-11 ENCOUNTER — Ambulatory Visit (HOSPITAL_COMMUNITY)
Admission: EM | Admit: 2021-03-11 | Discharge: 2021-03-11 | Disposition: A | Discharge status: Home / Self Care | Payer: Medicaid Other | Attending: Urgent Care | Admitting: Urgent Care

## 2021-03-11 DIAGNOSIS — N898 Other specified noninflammatory disorders of vagina: Secondary | ICD-10-CM | POA: Insufficient documentation

## 2021-03-11 DIAGNOSIS — N760 Acute vaginitis: Secondary | ICD-10-CM

## 2021-03-11 MED ORDER — TINIDAZOLE 500 MG PO TABS: 2.0000 g | ORAL_TABLET | Freq: Every day | ORAL | 0 refills | Status: DC

## 2021-03-11 MED ORDER — METRONIDAZOLE 500 MG PO TABS: 500.0000 mg | ORAL_TABLET | Freq: Two times a day (BID) | ORAL | 0 refills | Status: AC

## 2021-03-11 NOTE — ED Provider Notes (Signed)
?[facility] ? ? ? ?CSN: [id number] ?Arrival date & time: 03/11/21  1510 ? ? ?  ? ?History   ?Chief Complaint ?Chief Complaint  ?Patient presents with  ? Vaginal Discharge  ? ? ?HPI ?Carmen Johnston is a 41 y.o. female.  ? ?Pleasant 41 year old female presents today with concerns of vaginal itching over the past 2 to 3 days.  She was treated and tested for trichomonas 1 month ago, states she took the single day dose, and felt better.  However symptoms have returned and are similar to previous episode.  The only symptom that has not returned is the dysuria.  She denies any pelvic pain, dysuria, hematuria, frequency.  She reports she is having white frothy vaginal discharge with significant itching.  She denies any rash.  She states she has not had any sexual encounters since getting back with her ex mid February.  ? ? ?Vaginal Discharge ? ?Past Medical History:  ?Diagnosis Date  ? Abnormal Pap smear   ? Anemia   ? Chlamydia   ? Trichomonas   ? Urinary tract infection   ? ? ?There are no problems to display for this patient. ? ? ?Past Surgical History:  ?Procedure Laterality Date  ? COLPOSCOPY    ? INDUCED ABORTION    ? ? ?OB History   ? ? Gravida  ?8  ? Para  ?4  ? Term  ?4  ? Preterm  ?0  ? AB  ?3  ? Living  ?4  ?  ? ? SAB  ?1  ? IAB  ?1  ? Ectopic  ?1  ? Multiple  ?   ? Live Births  ?4  ?   ?  ?  ? ? ? ?Home Medications   ? ?Prior to Admission medications   ?Medication Sig Start Date End Date Taking? Authorizing Provider  ?tinidazole (TINDAMAX) 500 MG tablet Take 4 tablets (2,000 mg total) by mouth daily with breakfast for 2 days. 03/11/21 03/13/21 Yes Gentle Hoge L, PA  ?fluticasone (FLONASE) 50 MCG/ACT nasal spray Place 2 sprays into both nostrils daily. 01/16/20 04/15/20  Lurene Shadow, PA-C  ?omeprazole (PRILOSEC) 40 MG capsule Take 1 capsule (40 mg total) by mouth daily. 04/16/18 11/26/18  Sherrilyn Rist, MD  ? ? ?Family History ?Family History  ?Problem Relation Age of Onset  ? Mental illness Mother    ? Anesthesia problems Neg Hx   ? Stomach cancer Neg Hx   ? Colon cancer Neg Hx   ? Pancreatic cancer Neg Hx   ? ? ?Social History ?Social History  ? ?Tobacco Use  ? Smoking status: Former  ?  Packs/day: 0.25  ?  Years: 0.00  ?  Pack years: 0.00  ?  Types: Cigarettes  ?  Quit date: 01/04/2017  ?  Years since quitting: 4.1  ? Smokeless tobacco: Never  ?Vaping Use  ? Vaping Use: Never used  ?Substance Use Topics  ? Alcohol use: Yes  ?  Comment: occ  ? Drug use: Yes  ?  Types: Marijuana  ?  Comment: occasionally  ? ? ? ?Allergies   ?Patient has no known allergies. ? ? ?Review of Systems ?Review of Systems  ?Genitourinary:  Positive for vaginal discharge (itching).  ?All other systems reviewed and are negative. ? ? ?Physical Exam ?Triage Vital Signs ?ED Triage Vitals [03/11/21 1607]  ?Enc Vitals Group  ?   BP (!) 147/110  ?   Pulse   ?  Resp 16  ?   Temp   ?   Temp src   ?   SpO2 100 %  ?   Weight   ?   Height   ?   Head Circumference   ?   Peak Flow   ?   Pain Score   ?   Pain Loc   ?   Pain Edu?   ?   Excl. in [place]?   ? ?No data found. ? ?Updated Vital Signs ?BP (!) 147/110 (BP Location: Left Arm)   Resp 16   LMP 02/20/2021   SpO2 100%  ? ?Visual Acuity ?Right Eye Distance:   ?Left Eye Distance:   ?Bilateral Distance:   ? ?Right Eye Near:   ?Left Eye Near:    ?Bilateral Near:    ? ?Physical Exam ?Vitals and nursing note reviewed.  ?Constitutional:   ?   Appearance: Normal appearance. She is well-developed and normal weight.  ?HENT:  ?   Head: Normocephalic.  ?Cardiovascular:  ?   Rate and Rhythm: Normal rate.  ?   Heart sounds: No murmur heard. ?Pulmonary:  ?   Effort: Pulmonary effort is normal. No respiratory distress.  ?   Breath sounds: No wheezing.  ?Abdominal:  ?   General: Abdomen is flat. Bowel sounds are normal. There is no distension. There are no signs of injury.  ?   Palpations: Abdomen is soft. There is no shifting dullness, fluid wave, hepatomegaly, splenomegaly, mass or pulsatile mass.  ?   Tenderness:  There is no abdominal tenderness. There is no right CVA tenderness, left CVA tenderness, guarding or rebound. Negative signs include Murphy's sign, Rovsing's sign, McBurney's sign, psoas sign and obturator sign.  ?   Hernia: No hernia is present.  ?Genitourinary: ?   General: Normal vulva.  ?   Pubic Area: No rash or pubic lice.   ?   Labia:     ?   Right: No rash, tenderness, lesion or injury.     ?   Left: No rash, tenderness, lesion or injury.   ?   Urethra: No prolapse, urethral pain, urethral swelling or urethral lesion.  ?   Vagina: No signs of injury and foreign body. Vaginal discharge (whitish green frothy discharge without signs of yeast) present. No erythema, tenderness, bleeding, lesions or prolapsed vaginal walls.  ?   Cervix: No cervical motion tenderness, discharge, friability, lesion, erythema or eversion.  ?   Uterus: Normal. Not deviated, not enlarged, not fixed, not tender and no uterine prolapse.   ?   Adnexa: Right adnexa normal and left adnexa normal.    ?   Right: No mass, tenderness or fullness.      ?   Left: No mass, tenderness or fullness.    ?   Rectum: Normal.  ?Neurological:  ?   Mental Status: She is alert.  ? ? ? ?UC Treatments / Results  ?Labs ?(all labs ordered are listed, but only abnormal results are displayed) ?Labs Reviewed  ?CERVICOVAGINAL ANCILLARY ONLY  ? ? ?EKG ? ? ?Radiology ?No results found. ? ?Procedures ?Procedures (including critical care time) ? ?Medications Ordered in UC ?Medications - No data to display ? ?Initial Impression / Assessment and Plan / UC Course  ?I have reviewed the triage vital signs and the nursing notes. ? ?Pertinent labs & imaging results that were available during my care of the patient were reviewed by me and considered in my medical decision making (see chart  for details). ? ?  ? 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 Pt likely did not respond to the prior therapy. She was prescribed both flagyl and tinidazole, but it sounds  like she may have only taken one. Will do the two day 2g tindamax tx today, pt requesting this over metronidazole. I did recommend she f/u with her PCP in one week to perform a test of cure. Will call with results of aptima swab. ? ?Final Clinical Impressions(s) / UC Diagnoses  ? ?Final diagnoses:  ?Vaginal itching  ?Vaginal discharge  ? ? ? ?Discharge Instructions   ? ?  ?Your symptoms are most concerning for trichomonas.  I suspect you did not respond fully to the treatment provided last month. ?Please take 4 tablets of Tindamax today, followed by another 4 tablets tomorrow. ?Please follow-up with your PCP in 1 week to do a test of cure. ?We will call with the results of your Aptima swab should anything additional be positive. ?Also follow-up with your PCP regarding your blood pressure as it has been elevated the past 2 times you have been here.  You can also monitor it at home, with a goal blood pressure of 120/80. ? ? ? ? ?ED Prescriptions   ? ? Medication Sig Dispense Auth. Provider  ? tinidazole (TINDAMAX) 500 MG tablet Take 4 tablets (2,000 mg total) by mouth daily with breakfast for 2 days. 8 tablet [place], [place] L, PA  ? ?  ? ?PDMP not reviewed this encounter. ?  Maretta Bees?Karter Hellmer L, [place]PA ?03/11/21 1701 ? ?

## 2021-03-11 NOTE — ED Triage Notes (Signed)
Pt presents for fishy odor vaginal discharge for a couple of days. ?

## 2021-03-11 NOTE — Telephone Encounter (Signed)
Pt called back and stated that her script for tindamax required a prior auth and could not be filled. Requesting alternate. ?

## 2021-03-11 NOTE — Discharge Instructions (Signed)
Your symptoms are most concerning for trichomonas.  I suspect you did not respond fully to the treatment provided last month. ?Please take 4 tablets of Tindamax today, followed by another 4 tablets tomorrow. ?Please follow-up with your PCP in 1 week to do a test of cure. ?We will call with the results of your Aptima swab should anything additional be positive. ?Also follow-up with your PCP regarding your blood pressure as it has been elevated the past 2 times you have been here.  You can also monitor it at home, with a goal blood pressure of 120/80. ?

## 2021-03-14 ENCOUNTER — Telehealth (HOSPITAL_COMMUNITY): Payer: Self-pay | Admitting: Emergency Medicine

## 2021-03-14 DIAGNOSIS — R87611 Atypical squamous cells cannot exclude high grade squamous intraepithelial lesion on cytologic smear of cervix (ASC-H): Secondary | ICD-10-CM | POA: Insufficient documentation

## 2021-03-14 DIAGNOSIS — R87619 Unspecified abnormal cytological findings in specimens from cervix uteri: Secondary | ICD-10-CM | POA: Insufficient documentation

## 2021-03-14 DIAGNOSIS — R87810 Cervical high risk human papillomavirus (HPV) DNA test positive: Secondary | ICD-10-CM | POA: Insufficient documentation

## 2021-03-14 LAB — CERVICOVAGINAL ANCILLARY ONLY
Bacterial Vaginitis (gardnerella): POSITIVE — AB
Candida Glabrata: NEGATIVE
Candida Vaginitis: POSITIVE — AB
Chlamydia: NEGATIVE
Comment: NEGATIVE
Comment: NEGATIVE
Comment: NEGATIVE
Comment: NEGATIVE
Comment: NEGATIVE
Comment: NORMAL
Neisseria Gonorrhea: NEGATIVE
Trichomonas: POSITIVE — AB

## 2021-03-14 MED ORDER — FLUCONAZOLE 150 MG PO TABS: 150.0000 mg | ORAL_TABLET | Freq: Once | ORAL | 0 refills | Status: AC

## 2021-03-15 ENCOUNTER — Other Ambulatory Visit: Payer: Self-pay | Admitting: Obstetrics & Gynecology

## 2021-03-15 ENCOUNTER — Encounter: Payer: Self-pay | Admitting: Obstetrics & Gynecology

## 2021-03-15 ENCOUNTER — Encounter (HOSPITAL_BASED_OUTPATIENT_CLINIC_OR_DEPARTMENT_OTHER): Payer: Self-pay | Admitting: Obstetrics & Gynecology

## 2021-03-15 ENCOUNTER — Other Ambulatory Visit: Payer: Self-pay

## 2021-03-15 ENCOUNTER — Ambulatory Visit (INDEPENDENT_AMBULATORY_CARE_PROVIDER_SITE_OTHER): Payer: Medicaid Other | Admitting: Obstetrics & Gynecology

## 2021-03-15 VITALS — BP 124/83 | HR 77 | Wt 194.6 lb

## 2021-03-15 DIAGNOSIS — R87611 Atypical squamous cells cannot exclude high grade squamous intraepithelial lesion on cytologic smear of cervix (ASC-H): Secondary | ICD-10-CM | POA: Diagnosis not present

## 2021-03-15 DIAGNOSIS — Z01812 Encounter for preprocedural laboratory examination: Secondary | ICD-10-CM | POA: Diagnosis not present

## 2021-03-15 DIAGNOSIS — R87810 Cervical high risk human papillomavirus (HPV) DNA test positive: Secondary | ICD-10-CM

## 2021-03-15 DIAGNOSIS — R87619 Unspecified abnormal cytological findings in specimens from cervix uteri: Secondary | ICD-10-CM | POA: Diagnosis not present

## 2021-03-15 DIAGNOSIS — Z9889 Other specified postprocedural states: Secondary | ICD-10-CM

## 2021-03-15 DIAGNOSIS — Z1231 Encounter for screening mammogram for malignant neoplasm of breast: Secondary | ICD-10-CM

## 2021-03-15 LAB — POCT URINE PREGNANCY: Preg Test, Ur: NEGATIVE

## 2021-03-15 MED ORDER — IBUPROFEN 200 MG PO TABS
800.0000 mg | ORAL_TABLET | Freq: Once | ORAL | Status: AC
Start: 2021-03-15 — End: 2021-03-15
  Administered 2021-03-15: 800 mg via ORAL

## 2021-03-15 NOTE — Progress Notes (Signed)
Spoke w/ via phone for pre-op interview--- pt ?Lab needs dos----   urine preg            ?Lab results------ no ?COVID test -----patient states asymptomatic no test needed ?Arrive at ------- 1100 on 03-16-2021 ?NPO after MN NO Solid Food.  Clear liquids from MN until--- 1000 ?Med rec completed ?Medications to take morning of surgery ----- none ?Diabetic medication ----- n/a ?Patient instructed no nail polish to be worn day of surgery ?Patient instructed to bring photo id and insurance card day of surgery ?Patient aware to have Driver (ride ) / caregiver for 24 hours after surgery -- father, Maisie Fus ?Patient Special Instructions ----- n/a ?Pre-Op special Istructions ----- n/a ?Patient verbalized understanding of instructions that were given at this phone interview. ?Patient denies shortness of breath, chest pain, fever, cough at this phone interview.  ?

## 2021-03-15 NOTE — Progress Notes (Signed)
?  ?  ?  GYNECOLOGY OFFICE PROCEDURE NOTE ?  ?Carmen Johnston is a 41 y.o. [phone] here for colposcopy and endometrial biopsy for recent abnormal pap smear done 12/23/2020 at [facility]; the pap showed ASC-H/AGC/HPV positive. Today, she reports no concerning symptoms.   ? ?COLPOSCOPY PROCEDURE DETAILS ?Discussed role for HPV in cervical dysplasia, need for surveillance. Also indications for endometrial biopsy were reviewed.   Risks of the biopsy including cramping, bleeding, infection, uterine perforation, inadequate specimen and need for additional procedures were discussed. Offered alternative of hysteroscopy, dilation and curettage in OR. The patient states she understands the R/B/I/A and agrees to undergo colposcopy, biopsies and endocervical curettings today but wants to defer endometrial biopsy due to pain concerns. Desires hysteroscopy, dilation and curettage in OR for endometrial sampling. ? ?Urine pregnancy test was negative. Consent was signed. Time out was performed. Chaperone was present during entire procedure. ? ?Patient was placed in lithotomy position. A vaginal speculum was placed.  Cervix viewed with speculum and colposcope after application of acetic acid.  ? ?Colposcopy adequate? Yes ?Acetowhite lesion(s) noted at 1 and 4 o'clock; corresponding biopsies obtained.  ECC specimen obtained. ?All specimens were labeled and sent to pathology. ? ? ?Patient was given post procedure instructions. Ibuprofen was given to her for cramping after colposcopy.   Will follow up pathology and manage accordingly; patient will be contacted with results and recommendations.  Routine preventative health maintenance measures emphasized, screening mammogram scheduled for patient. ? ? ?As for the Hysteroscopy, Dilation and Curettage, this was scheduled for patient on 03/16/2021 at 1 pm.  Risks of procedure discussed with patient, information given to her to review at home. All questions answered.  Preoperative  instructions given to patient.  Preoperative orders signed and held. ? ?I spent 20 minutes dedicated to the care of this patient including pre-visit review of records, face to face time with the patient discussing her conditions and upcoming procedure.  ? ? ?Verita Schneiders, MD, FACOG ?Obstetrician Social research officer, government, Faculty Practice ?[facility] ?

## 2021-03-16 ENCOUNTER — Encounter (HOSPITAL_BASED_OUTPATIENT_CLINIC_OR_DEPARTMENT_OTHER)
Admission: RE | Disposition: A | Discharge status: Home / Self Care | Payer: Self-pay | Source: Home / Self Care | Attending: Obstetrics & Gynecology

## 2021-03-16 ENCOUNTER — Encounter (HOSPITAL_BASED_OUTPATIENT_CLINIC_OR_DEPARTMENT_OTHER): Payer: Self-pay | Admitting: Obstetrics & Gynecology

## 2021-03-16 ENCOUNTER — Encounter: Payer: Self-pay | Admitting: Obstetrics & Gynecology

## 2021-03-16 ENCOUNTER — Other Ambulatory Visit: Payer: Self-pay

## 2021-03-16 ENCOUNTER — Ambulatory Visit (HOSPITAL_BASED_OUTPATIENT_CLINIC_OR_DEPARTMENT_OTHER)
Admission: RE | Admit: 2021-03-16 | Discharge: 2021-03-16 | Disposition: A | Discharge status: Home / Self Care | Payer: Medicaid Other | Attending: Obstetrics & Gynecology | Admitting: Obstetrics & Gynecology

## 2021-03-16 ENCOUNTER — Ambulatory Visit (HOSPITAL_BASED_OUTPATIENT_CLINIC_OR_DEPARTMENT_OTHER): Payer: Medicaid Other | Admitting: Anesthesiology

## 2021-03-16 DIAGNOSIS — Z8616 Personal history of COVID-19: Secondary | ICD-10-CM | POA: Insufficient documentation

## 2021-03-16 DIAGNOSIS — Z87891 Personal history of nicotine dependence: Secondary | ICD-10-CM | POA: Diagnosis not present

## 2021-03-16 DIAGNOSIS — R87618 Other abnormal cytological findings on specimens from cervix uteri: Secondary | ICD-10-CM | POA: Insufficient documentation

## 2021-03-16 DIAGNOSIS — K219 Gastro-esophageal reflux disease without esophagitis: Secondary | ICD-10-CM | POA: Diagnosis not present

## 2021-03-16 DIAGNOSIS — R87619 Unspecified abnormal cytological findings in specimens from cervix uteri: Secondary | ICD-10-CM | POA: Diagnosis not present

## 2021-03-16 HISTORY — DX: Personal history of other infectious and parasitic diseases: Z86.19

## 2021-03-16 HISTORY — DX: Gastro-esophageal reflux disease without esophagitis: K21.9

## 2021-03-16 HISTORY — DX: Trichomonal vulvovaginitis: A59.01

## 2021-03-16 LAB — POCT PREGNANCY, URINE: Preg Test, Ur: NEGATIVE

## 2021-03-16 SURGERY — DILATATION AND CURETTAGE /HYSTEROSCOPY
Anesthesia: General | Site: Vagina

## 2021-03-16 MED ORDER — PHENYLEPHRINE 40 MCG/ML (10ML) SYRINGE FOR IV PUSH (FOR BLOOD PRESSURE SUPPORT)
PREFILLED_SYRINGE | INTRAVENOUS | Status: AC
  Filled 2021-03-16: qty 20

## 2021-03-16 MED ORDER — SCOPOLAMINE 1 MG/3DAYS TD PT72: 1.0000 | MEDICATED_PATCH | TRANSDERMAL | Status: DC

## 2021-03-16 MED ORDER — MIDAZOLAM HCL 2 MG/2ML IJ SOLN
INTRAMUSCULAR | Status: AC
  Filled 2021-03-16: qty 2

## 2021-03-16 MED ORDER — FENTANYL CITRATE (PF) 100 MCG/2ML IJ SOLN
INTRAMUSCULAR | Status: DC | PRN
  Administered 2021-03-16: 50 ug via INTRAVENOUS

## 2021-03-16 MED ORDER — DOCUSATE SODIUM 100 MG PO CAPS: 100.0000 mg | ORAL_CAPSULE | Freq: Two times a day (BID) | ORAL | 2 refills | Status: DC | PRN

## 2021-03-16 MED ORDER — GABAPENTIN 300 MG PO CAPS
ORAL_CAPSULE | ORAL | Status: AC
  Filled 2021-03-16: qty 1

## 2021-03-16 MED ORDER — ACETAMINOPHEN 500 MG PO TABS
ORAL_TABLET | ORAL | Status: AC
  Filled 2021-03-16: qty 2

## 2021-03-16 MED ORDER — LIDOCAINE HCL (PF) 2 % IJ SOLN
INTRAMUSCULAR | Status: AC
  Filled 2021-03-16: qty 10

## 2021-03-16 MED ORDER — MEPERIDINE HCL 25 MG/ML IJ SOLN: 6.2500 mg | INTRAMUSCULAR | Status: DC | PRN

## 2021-03-16 MED ORDER — GABAPENTIN 300 MG PO CAPS
300.0000 mg | ORAL_CAPSULE | ORAL | Status: AC
  Administered 2021-03-16: 300 mg via ORAL

## 2021-03-16 MED ORDER — LIDOCAINE HCL (CARDIAC) PF 100 MG/5ML IV SOSY
PREFILLED_SYRINGE | INTRAVENOUS | Status: DC | PRN
  Administered 2021-03-16: 60 mg via INTRAVENOUS

## 2021-03-16 MED ORDER — ONDANSETRON HCL 4 MG/2ML IJ SOLN
INTRAMUSCULAR | Status: DC | PRN
  Administered 2021-03-16: 4 mg via INTRAVENOUS

## 2021-03-16 MED ORDER — IBUPROFEN 600 MG PO TABS: 600.0000 mg | ORAL_TABLET | Freq: Four times a day (QID) | ORAL | 2 refills | Status: DC | PRN

## 2021-03-16 MED ORDER — MIDAZOLAM HCL 2 MG/2ML IJ SOLN
INTRAMUSCULAR | Status: DC | PRN
  Administered 2021-03-16: 2 mg via INTRAVENOUS

## 2021-03-16 MED ORDER — OXYCODONE HCL 5 MG/5ML PO SOLN: 5.0000 mg | Freq: Once | ORAL | Status: DC | PRN

## 2021-03-16 MED ORDER — BUPIVACAINE HCL 0.5 % IJ SOLN
INTRAMUSCULAR | Status: DC | PRN
  Administered 2021-03-16: 30 mL

## 2021-03-16 MED ORDER — DEXAMETHASONE SODIUM PHOSPHATE 4 MG/ML IJ SOLN
INTRAMUSCULAR | Status: DC | PRN
  Administered 2021-03-16: 8 mg via INTRAVENOUS

## 2021-03-16 MED ORDER — ACETAMINOPHEN 500 MG PO TABS
1000.0000 mg | ORAL_TABLET | ORAL | Status: AC
  Administered 2021-03-16: 1000 mg via ORAL

## 2021-03-16 MED ORDER — KETOROLAC TROMETHAMINE 30 MG/ML IJ SOLN
INTRAMUSCULAR | Status: DC | PRN
  Administered 2021-03-16: 30 mg via INTRAVENOUS

## 2021-03-16 MED ORDER — LACTATED RINGERS IV SOLN
INTRAVENOUS | Status: DC
  Administered 2021-03-16: 1000 mL via INTRAVENOUS

## 2021-03-16 MED ORDER — OXYCODONE HCL 5 MG PO TABS: 5.0000 mg | ORAL_TABLET | Freq: Once | ORAL | Status: DC | PRN

## 2021-03-16 MED ORDER — SODIUM CHLORIDE 0.9 % IR SOLN
Status: DC | PRN
  Administered 2021-03-16: 3000 mL

## 2021-03-16 MED ORDER — OXYCODONE HCL 5 MG PO TABS: 5.0000 mg | ORAL_TABLET | ORAL | 0 refills | Status: DC | PRN

## 2021-03-16 MED ORDER — HYDROMORPHONE HCL 1 MG/ML IJ SOLN
INTRAMUSCULAR | Status: AC
  Filled 2021-03-16: qty 1

## 2021-03-16 MED ORDER — AMISULPRIDE (ANTIEMETIC) 5 MG/2ML IV SOLN: 10.0000 mg | Freq: Once | INTRAVENOUS | Status: DC | PRN

## 2021-03-16 MED ORDER — FENTANYL CITRATE (PF) 100 MCG/2ML IJ SOLN
INTRAMUSCULAR | Status: AC
  Filled 2021-03-16: qty 2

## 2021-03-16 MED ORDER — HYDROMORPHONE HCL 1 MG/ML IJ SOLN
0.2500 mg | INTRAMUSCULAR | Status: DC | PRN
  Administered 2021-03-16: 0.25 mg via INTRAVENOUS

## 2021-03-16 MED ORDER — PROPOFOL 10 MG/ML IV BOLUS
INTRAVENOUS | Status: DC | PRN
  Administered 2021-03-16 (×3): 10 mg via INTRAVENOUS
  Administered 2021-03-16: 200 mg via INTRAVENOUS

## 2021-03-16 MED ORDER — POVIDONE-IODINE 10 % EX SWAB
2.0000 | Freq: Once | CUTANEOUS | Status: DC
Start: 2021-03-16 — End: 2021-03-16

## 2021-03-16 MED ORDER — LACTATED RINGERS IV SOLN: INTRAVENOUS | Status: DC

## 2021-03-16 SURGICAL SUPPLY — 13 items
CATH ROBINSON RED A/P 16FR (CATHETERS) ×2 IMPLANT
GAUZE 4X4 16PLY ~~LOC~~+RFID DBL (SPONGE) ×2 IMPLANT
GLOVE SURG LTX SZ7 (GLOVE) ×2 IMPLANT
GLOVE SURG UNDER POLY LF SZ6.5 (GLOVE) ×2 IMPLANT
GLOVE SURG UNDER POLY LF SZ7 (GLOVE) ×4 IMPLANT
GOWN STRL REUS W/TWL LRG LVL3 (GOWN DISPOSABLE) ×4 IMPLANT
IV NS IRRIG 3000ML ARTHROMATIC (IV SOLUTION) ×1 IMPLANT
KIT PROCEDURE FLUENT (KITS) ×2 IMPLANT
KIT TURNOVER CYSTO (KITS) ×2 IMPLANT
PACK VAGINAL MINOR WOMEN LF (CUSTOM PROCEDURE TRAY) ×2 IMPLANT
PAD OB MATERNITY 4.3X12.25 (PERSONAL CARE ITEMS) ×2 IMPLANT
SOL PREP POV-IOD 4OZ 10% (MISCELLANEOUS) ×1 IMPLANT
UNDERPAD 30X36 HEAVY ABSORB (UNDERPADS AND DIAPERS) ×2 IMPLANT

## 2021-03-16 NOTE — Anesthesia Procedure Notes (Signed)
Procedure Name: LMA Insertion ?Date/Time: 03/16/2021 11:54 AM ?Performed by: Earmon Phoenix, CRNA ?Pre-anesthesia Checklist: Patient identified, Emergency Drugs available, Suction available, Patient being monitored and Timeout performed ?Patient Re-evaluated:Patient Re-evaluated prior to induction ?Oxygen Delivery Method: Circle system utilized ?Preoxygenation: Pre-oxygenation with 100% oxygen ?Induction Type: IV induction ?Ventilation: Mask ventilation without difficulty ?LMA: LMA inserted ?LMA Size: 4.0 ?Number of attempts: 1 ?Placement Confirmation: positive ETCO2, CO2 detector and breath sounds checked- equal and bilateral ?Tube secured with: Tape ?Dental Injury: Teeth and Oropharynx as per pre-operative assessment  ? ? ? ? ?

## 2021-03-16 NOTE — Discharge Instructions (Signed)
NO TYLENOL CONTAINING PRODUCTS UNTIL 4:15 pm TODAY. ? ? ? ?DISCHARGE INSTRUCTIONS: HYSTEROSCOPY / ENDOMETRIAL ABLATION ?The following instructions have been prepared to help you care for yourself upon your return home. ? ?Personal hygiene: ? Use sanitary pads for vaginal drainage, not tampons. ? Shower the day after your procedure. ? NO tub baths, pools or Jacuzzis for 2-3 weeks. ? Wipe front to back after using the bathroom. ? ?Activity and limitations: ? Do NOT drive or operate any equipment for 24 hours. The effects of anesthesia are still present ?and drowsiness may result. ? Do NOT rest in bed all day. ? Walking is encouraged. ? Walk up and down stairs slowly. ? You may resume your normal activity in one to two days or as indicated by your physician. ?Sexual activity: NO intercourse for at least 2 weeks after the procedure, or as indicated by your ?Doctor. ? ?Diet: Eat a light meal as desired this evening. You may resume your usual diet tomorrow. ? ?Return to Work: You may resume your work activities in one to two days or as indicated by your ?Doctor. ? ?What to expect after your surgery: Expect to have vaginal bleeding/discharge for 2-3 days and ?spotting for up to 10 days. It is not unusual to have soreness for up to 1-2 weeks. You may have a ?slight burning sensation when you urinate for the first day. Mild cramps may continue for a couple of ?days. You may have a regular period in 2-6 weeks. ? ?Call your doctor for any of the following: ? Excessive vaginal bleeding or clotting, saturating and changing one pad every hour. ? Inability to urinate 6 hours after discharge from hospital. ? Pain not relieved by pain medication. ? Fever of 100.4? F or greater. ? Unusual vaginal discharge or odor. ? ? ? ? ? ?Post Anesthesia Home Care Instructions ? ?Activity: ?Get plenty of rest for the remainder of the day. A responsible individual must stay with you for 24 hours following the procedure.  ?For the next 24 hours, DO  NOT: ?-Drive a car ?-Paediatric nurse ?-Drink alcoholic beverages ?-Take any medication unless instructed by your physician ?-Make any legal decisions or sign important papers. ? ?Meals: ?Start with liquid foods such as gelatin or soup. Progress to regular foods as tolerated. Avoid greasy, spicy, heavy foods. If nausea and/or vomiting occur, drink only clear liquids until the nausea and/or vomiting subsides. Call your physician if vomiting continues. ? ?Special Instructions/Symptoms: ?Your throat may feel dry or sore from the anesthesia or the breathing tube placed in your throat during surgery. If this causes discomfort, gargle with warm salt water. The discomfort should disappear within 24 hours. ? ?    ?

## 2021-03-16 NOTE — Anesthesia Preprocedure Evaluation (Addendum)
Anesthesia Evaluation  ?Patient identified by MRN, date of birth, ID band ?Patient awake ? ? ? ?Reviewed: ?Allergy & Precautions, NPO status , Patient's Chart, lab work & pertinent test results ? ?Airway ?Mallampati: II ? ?TM Distance: >3 FB ?Neck ROM: Full ? ? ? Dental ? ?(+) Missing, Dental Advisory Given, Teeth Intact ?  ?Pulmonary ?neg pulmonary ROS, former smoker,  ?  ?Pulmonary exam normal ?breath sounds clear to auscultation ? ? ? ? ? ? Cardiovascular ?negative cardio ROS ?Normal cardiovascular exam ?Rhythm:Regular Rate:Normal ? ? ?  ?Neuro/Psych ?negative neurological ROS ?   ? GI/Hepatic ?Neg liver ROS, GERD  ,  ?Endo/Other  ?negative endocrine ROS ? Renal/GU ?negative Renal ROS  ? ?  ?Musculoskeletal ?negative musculoskeletal ROS ?(+)  ? Abdominal ?  ?Peds ? Hematology ? ?(+) Blood dyscrasia, anemia ,   ?Anesthesia Other Findings ? ? Reproductive/Obstetrics ? ?  ? ? ? ? ? ? ? ? ? ? ? ? ? ?  ?  ? ? ? ? ? ? ? ?Anesthesia Physical ?Anesthesia Plan ? ?ASA: 2 ? ?Anesthesia Plan: General  ? ?Post-op Pain Management: Tylenol PO (pre-op)*, Gabapentin PO (pre-op)* and Toradol IV (intra-op)*  ? ?Induction: Intravenous ? ?PONV Risk Score and Plan: 4 or greater and Ondansetron, Dexamethasone, Treatment may vary due to age or medical condition, Midazolam and Scopolamine patch - Pre-op ? ?Airway Management Planned: LMA ? ?Additional Equipment:  ? ?Intra-op Plan:  ? ?Post-operative Plan: Extubation in OR ? ?Informed Consent: I have reviewed the patients History and Physical, chart, labs and discussed the procedure including the risks, benefits and alternatives for the proposed anesthesia with the patient or authorized representative who has indicated his/her understanding and acceptance.  ? ? ? ?Dental advisory given ? ?Plan Discussed with: CRNA ? ?Anesthesia Plan Comments:   ? ? ? ? ? ?Anesthesia Quick Evaluation ? ?

## 2021-03-16 NOTE — Transfer of Care (Signed)
Immediate Anesthesia Transfer of Care Note ? ?Patient: Carmen Johnston ? ?Procedure(s) Performed: DILATATION AND CURETTAGE /HYSTEROSCOPY (Vagina ) ? ?Patient Location: PACU ? ?Anesthesia Type:General ? ?Level of Consciousness: awake and patient cooperative ? ?Airway & Oxygen Therapy: Patient Spontanous Breathing and Patient connected to nasal cannula oxygen ? ?Post-op Assessment: Report given to RN and Post -op Vital signs reviewed and stable ? ?Post vital signs: Reviewed and stable ? ?Last Vitals:  ?Vitals Value Taken Time  ?BP    ?Temp    ?Pulse 77 03/16/21 1240  ?Resp 14 03/16/21 1240  ?SpO2 100 % 03/16/21 1240  ?Vitals shown include unvalidated device data. ? ?Last Pain:  ?Vitals:  ? 03/16/21 1049  ?TempSrc: Oral  ?PainSc: 0-No pain  ?   ? ?Patients Stated Pain Goal: 5 (03/16/21 1106) ? ?Complications: No notable events documented. ?

## 2021-03-16 NOTE — H&P (Signed)
? ? ?Preoperative History and Physical ? ?Carmen Johnston is a 41 y.o. [id number] here for surgical evaluation of atypical glandular cells seen on pap smear done 12/23/2020.  Of note, pap smear also showed atypical squamous cells cannot exclude high grade squamous intraepithelial lesion (ASC-H) and high risk HPV positive. She underwent colposcopy, ectocervical biopsies and endocervical curettings in office of 03/15/2021 for evaluation but absolutely refused to undergo endometrial biopsy while in office due to concerns about pain.  She wanted this to be done under anesthesia.  Fortunately, she was able to be scheduled today for endometrial sampling under anesthesia in the operating room  No significant preoperative concerns. ? ?Proposed surgery: Hysteroscopy, Dilation and Curettage ? ?Past Medical History:  ?Diagnosis Date  ? Abnormal Pap smear 12/23/2020  ? ASC-H/AGC/HPV pos. Had colposcopy in office on 03/15/21, Hysteroscopy, D&C on 03/16/21  ? Anemia   ? Chlamydia   ? GERD (gastroesophageal reflux disease)   ? History of COVID-19 2022  ? per pt mild symptoms that resolved  ? 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 ?Past Surgical History:  ?Procedure Laterality Date  ? WISDOM TOOTH EXTRACTION    ? teen  ? ?OB History  ?Gravida Para Term Preterm AB Living  ?8 4 4  0 3 4  ?SAB IAB Ectopic Multiple Live Births  ?1 1 1   4   ?  ?# Outcome Date GA Lbr Len/2nd Weight Sex Delivery Anes PTL Lv  ?8 Gravida           ?7 Term 07/20/11 [redacted]w[redacted]d 15:36 / 00:12 2886 g M Vag-Spont EPI  LIV  ?6 Ectopic 2010          ?   Birth Comments: MTX  ?5 Term 10/28/05    F Vag-Spont  N DEC  ?4 Term 07/04/04    M Vag-Spont  N LIV  ?3 Term 02/19/98    M Vag-Spont   LIV  ?   Birth Comments: pre-eclampsia  ?2 SAB           ?1 IAB           ?Patient denies any other pertinent gynecologic issues.  ? ?No current facility-administered medications on file prior to encounter.  ? ?Current Outpatient Medications on File Prior to  Encounter  ?Medication Sig Dispense Refill  ? calcium carbonate (TUMS - DOSED IN MG ELEMENTAL CALCIUM) 500 MG chewable tablet Chew 1 tablet by mouth as needed for indigestion or heartburn.    ? Cholecalciferol (VITAMIN D-3 PO) Take by mouth daily.    ? metroNIDAZOLE (FLAGYL) 500 MG tablet Take 1 tablet (500 mg total) by mouth 2 (two) times daily for 7 days. 14 tablet 0  ? Multiple Vitamins-Minerals (CENTRUM ADULTS PO) Take by mouth daily.    ? OVER THE COUNTER MEDICATION Take 30 mLs by mouth 2 (two) times daily as needed. Daquil for sinus pressure and runny nose    ? [DISCONTINUED] fluticasone (FLONASE) 50 MCG/ACT nasal spray Place 2 sprays into both nostrils daily. 16 g 2  ? [DISCONTINUED] omeprazole (PRILOSEC) 40 MG capsule Take 1 capsule (40 mg total) by mouth daily. 90 capsule 0  ? ?No Known Allergies ? ?Social History:   reports that she quit smoking about 4 years ago. Her smoking use included cigarettes. She has a 2.50 pack-year smoking history. She has never used smokeless tobacco. She reports current alcohol use. She reports current drug use. Drug: Marijuana. ? ?Family History  ?  Problem Relation Age of Onset  ? Mental illness Mother   ? Anesthesia problems Neg Hx   ? Stomach cancer Neg Hx   ? Colon cancer Neg Hx   ? Pancreatic cancer Neg Hx   ? ? ?Review of Systems: Pertinent items noted in HPI and remainder of comprehensive ROS otherwise negative. ? ?PHYSICAL EXAM: ?Blood pressure 125/89, pulse 80, temperature 99.4 ?F (37.4 ?C), temperature source Oral, resp. rate 20, height 5\' 8"  (1.727 m), weight 88.2 kg, last menstrual period 02/20/2021, SpO2 100 %, unknown if currently breastfeeding. ?CONSTITUTIONAL: Well-developed, well-nourished female in no acute distress.  ?HENT:  Normocephalic, atraumatic, External right and left ear normal. Oropharynx is clear and moist ?EYES: Conjunctivae and EOM are normal. Pupils are equal, round, and reactive to light. No scleral icterus.  ?NECK: Normal range of motion,  supple, no masses ?SKIN: Skin is warm and dry. No rash noted. Not diaphoretic. No erythema. No pallor. ?NEUROLOGIC: Alert and oriented to person, place, and time. Normal reflexes, muscle tone coordination. No cranial nerve deficit noted. ?PSYCHIATRIC: Normal mood and affect. Normal behavior. Normal judgment and thought content. ?CARDIOVASCULAR: Normal heart rate noted, regular rhythm ?RESPIRATORY: Effort and breath sounds normal, no problems with respiration noted ?ABDOMEN: Soft, nontender, nondistended. ?PELVIC: Deferred ?MUSCULOSKELETAL: Normal range of motion. No edema and no tenderness. 2+ distal pulses. ? ?Labs: ?Results for orders placed or performed during the hospital encounter of 03/16/21 (from the past 48 hour(s))  ?Pregnancy, urine POC     Status: None  ? Collection Time: 03/16/21 10:43 AM  ?Result Value Ref Range  ? Preg Test, Ur NEGATIVE NEGATIVE  ?  Comment:        ?THE SENSITIVITY OF THIS ?METHODOLOGY IS >24 mIU/mL ?  ? ? ?Assessment: ?Principal Problem: ?  Atypical cervical glandular cells on 12/23/2020 pap smear ? ? ?Plan: ?Patient will undergo surgical evaluation with Hysteroscopy, Dilation and Curettage.   The risks of surgery were discussed in detail with the patient including but not limited to: bleeding which may require transfusion; infection which may require antibiotics; injury to uterus or surrounding organs; intrauterine scarring which may impair future fertility; need for additional procedures including laparotomy, laparoscopy or subsequent procedures secondary to abnormal pathology; and other postoperative/anesthesia complications. Likelihood of success in evaluating the patient's condition was discussed. Routine postoperative instructions will be reviewed with the patient and her family in detail after surgery.  The patient concurred with the proposed plan, giving informed written consent for the surgery.  Patient has been NPO since last night and she will remain NPO for procedure.   Anesthesia and OR aware.   To OR when ready. ? ? ?12/25/2020, MD, FACOG ?Obstetrician Jaynie Collins, Faculty Practice ?[facility], [facility] ? ?

## 2021-03-16 NOTE — Op Note (Signed)
PREOPERATIVE DIAGNOSES:  Atypical glandular cells on pap smear, patient declined office endometrial sampling ?POSTOPERATIVE DIAGNOSES: The same ?PROCEDURE: Hysteroscopy, Dilation and Curettage. ?SURGEON:  Dr. Jaynie Collins ? ? ?INDICATIONS: 41 y.o. [id number]  here for scheduled surgical evaluation for the aforementioned diagnoses.   Risks of surgery were discussed with the patient including but not limited to: bleeding which may require transfusion; infection which may require antibiotics; injury to uterus or surrounding organs; intrauterine scarring which may impair future fertility; need for additional procedures including laparotomy or laparoscopy; and other postoperative/anesthesia complications. Written informed consent was obtained.   ? ?FINDINGS:  A 9 week size uterus.  Very diffusely proliferative endometrium.  Normal ostia bilaterally. ? ?ANESTHESIA:   General, paracervical block with 30 ml of 0.5% Marcaine ?FLUID DEFICITS:  50 ml of NS ?ESTIMATED BLOOD LOSS:  Less than 20 ml ?SPECIMENS: Endometrial curettings sent to pathology ?COMPLICATIONS:  None immediate. ? ?PROCEDURE DETAILS:  The patient was then taken to the operating room where general anesthesia was administered and was found to be adequate.  After an adequate timeout was performed, she was placed in the dorsal lithotomy position and examined; then prepped and draped in the sterile manner.   Her bladder was catheterized for an unmeasured amount of clear, yellow urine. A speculum was then placed in the patient's vagina and a single tooth tenaculum was applied to the anterior lip of the cervix.   A paracervical block using 30 ml of 0.5% Marcaine was administered.  The uterus was sounded to 9 cm and the cervix was dilated manually with metal dilators to accommodate the 5 mm diagnostic hysteroscope.  The hysteroscope was then inserted under direct visualization using NS as a suspension medium.  The uterine cavity was carefully examined with the  findings as noted above.   After further careful visualization of the uterine cavity, the hysteroscope was removed under direct visualization.  A sharp curettage was then performed to obtain a significant amount of endometrial curettings.  The tenaculum was removed from the anterior lip of the cervix and the vaginal speculum was removed after noting good hemostasis.  The patient tolerated the procedure well and was taken to the recovery area awake, extubated and in stable condition. ? ?The patient will be discharged to home as per PACU criteria.  Routine postoperative instructions given.  She was prescribed Oxycodone, Ibuprofen and Colace.  She will follow up in the clinic on 04/05/2021  for postoperative evaluation. ? ? ? ?Jaynie Collins, MD, FACOG ?Obstetrician Heritage manager, Faculty Practice ?[facility] for [employer], [facility] ?

## 2021-03-16 NOTE — Anesthesia Postprocedure Evaluation (Signed)
Anesthesia Post Note ? ?Patient: [name] ? ?Procedure(s) Performed: DILATATION AND CURETTAGE /HYSTEROSCOPY (Vagina ) ? ?  ? ?Patient location during evaluation: PACU ?Anesthesia Type: General ?Level of consciousness: sedated and patient cooperative ?Pain management: pain level controlled ?Vital Signs Assessment: post-procedure vital signs reviewed and stable ?Respiratory status: spontaneous breathing ?Cardiovascular status: stable ?Anesthetic complications: no ? ? ?No notable events documented. ? ?Last Vitals:  ?Vitals:  ? 03/16/21 1330 03/16/21 1410  ?BP: 122/88 124/88  ?Pulse: 65 66  ?Resp: 13 15  ?Temp:  36.7 ?C  ?SpO2: 94% 99%  ?  ?Last Pain:  ?Vitals:  ? 03/16/21 1410  ?TempSrc:   ?PainSc: 3   ? ? ?  ?  ?  ?  ?  ?  ? ?Nolon Nations ? ? ? ? ?

## 2021-03-17 ENCOUNTER — Encounter (HOSPITAL_BASED_OUTPATIENT_CLINIC_OR_DEPARTMENT_OTHER): Payer: Self-pay | Admitting: Obstetrics & Gynecology

## 2021-03-17 LAB — SURGICAL PATHOLOGY

## 2021-03-21 ENCOUNTER — Encounter: Payer: Self-pay | Admitting: Obstetrics & Gynecology

## 2021-03-21 DIAGNOSIS — N871 Moderate cervical dysplasia: Secondary | ICD-10-CM | POA: Insufficient documentation

## 2021-03-22 ENCOUNTER — Ambulatory Visit
Admission: RE | Admit: 2021-03-22 | Discharge: 2021-03-22 | Disposition: A | Discharge status: Home / Self Care | Payer: Medicaid Other | Source: Ambulatory Visit | Attending: Obstetrics & Gynecology | Admitting: Obstetrics & Gynecology

## 2021-03-22 DIAGNOSIS — Z1231 Encounter for screening mammogram for malignant neoplasm of breast: Secondary | ICD-10-CM | POA: Diagnosis not present

## 2021-03-24 ENCOUNTER — Telehealth: Payer: Self-pay | Admitting: *Deleted

## 2021-03-24 NOTE — Telephone Encounter (Signed)
-----   Message from Tereso Newcomer, MD sent at 03/21/2021  9:10 AM EDT ----- ?03/05/2021 Colposcopy Biopsy pathology after ASC-H/AGC/HPV + pap smear ? ?Diagnosis ?1. Ectocervical biopsy, At 1 and 4 o'clock ?- HIGH-GRADE SQUAMOUS INTRAEPITHELIAL LESION (HSIL)/CIN-2. ?2. Endocervix, curettage ?- FEW MINUTE ATYPICAL EPITHELIAL FRAGMENTS OF UNCERTAIN SIGNIFICANCE, IN A BACKGROUND OF NON-DYSPLASTIC ENDOCERVICAL EPITHELIUM AND MUCOSA. ? ?Patient needs appointment to discuss management options for CIN2 ; LEEP is recommended but alternative is cryotherapy. She was unable to withstand endometrial biopsy in office, unsure if she would withstand LEEP in office.  Discussion appointment can be face to face or virtual.  Please call to inform patient of results and recommendations. ? ? ?Jaynie Collins, MD ? ?

## 2021-03-24 NOTE — Telephone Encounter (Signed)
Pt informed results and to keep appt on 4/4 as Dr A will discuss next treatment options. Pt verbalizes and understands ?

## 2021-04-05 ENCOUNTER — Ambulatory Visit (INDEPENDENT_AMBULATORY_CARE_PROVIDER_SITE_OTHER): Payer: Medicaid Other | Admitting: Obstetrics & Gynecology

## 2021-04-05 ENCOUNTER — Encounter: Payer: Self-pay | Admitting: Obstetrics & Gynecology

## 2021-04-05 ENCOUNTER — Other Ambulatory Visit (HOSPITAL_COMMUNITY)
Admission: RE | Admit: 2021-04-05 | Discharge: 2021-04-05 | Disposition: A | Discharge status: Home / Self Care | Payer: Medicaid Other | Source: Ambulatory Visit | Attending: Obstetrics & Gynecology | Admitting: Obstetrics & Gynecology

## 2021-04-05 VITALS — BP 128/85 | HR 75 | Ht 68.0 in | Wt 194.8 lb

## 2021-04-05 DIAGNOSIS — N898 Other specified noninflammatory disorders of vagina: Secondary | ICD-10-CM | POA: Insufficient documentation

## 2021-04-05 DIAGNOSIS — N871 Moderate cervical dysplasia: Secondary | ICD-10-CM

## 2021-04-05 DIAGNOSIS — B9689 Other specified bacterial agents as the cause of diseases classified elsewhere: Secondary | ICD-10-CM

## 2021-04-05 DIAGNOSIS — R87619 Unspecified abnormal cytological findings in specimens from cervix uteri: Secondary | ICD-10-CM

## 2021-04-05 DIAGNOSIS — N760 Acute vaginitis: Secondary | ICD-10-CM | POA: Diagnosis not present

## 2021-04-05 NOTE — Progress Notes (Signed)
? ?GYNECOLOGY OFFICE VISIT NOTE ? ?History:  ? Carmen Johnston is a 41 y.o. [phone] here today for discussion of results and further management after recent colposcopy and hysteroscopy for ASC-H/AGC/HPV positive pap smear.  Had minimal spotting after hysteroscopy, no other concerns.  She denies any abnormal vaginal discharge, bleeding, pelvic pain or other concerns.  ?  ?Past Medical History:  ?Diagnosis Date  ? Abnormal Pap smear 12/23/2020  ? ASC-H/AGC/HPV pos. Had colposcopy in office on 03/15/21, Hysteroscopy, D&C on 03/16/21  ? Anemia   ? Chlamydia   ? GERD (gastroesophageal reflux disease)   ? History of COVID-19 2022  ? per pt mild symptoms that resolved  ? 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 ? ?Past Surgical History:  ?Procedure Laterality Date  ? HYSTEROSCOPY WITH D & C N/A 03/16/2021  ? Procedure: DILATATION AND CURETTAGE /HYSTEROSCOPY;  Surgeon: Tereso Newcomer, MD;  Location: [facility];  Service: Gynecology;  Laterality: N/A;  ? WISDOM TOOTH EXTRACTION    ? teen  ? ? ?The following portions of the patient's history were reviewed and updated as appropriate: allergies, current medications, past family history, past medical history, past social history, past surgical history and problem list.  ? ?Health Maintenance:  Normal mammogram on 03/23/2021.  ? ?Review of Systems:  ?Pertinent items noted in HPI and remainder of comprehensive ROS otherwise negative. ? ?Physical Exam:  ?BP 128/85 (BP Location: Left Arm, Patient Position: Sitting, Cuff Size: Normal)   Pulse 75   Ht 5\' 8"  (1.727 m)   Wt 194 lb 12.8 oz (88.4 kg)   BMI 29.62 kg/m?  ?CONSTITUTIONAL: Well-developed, well-nourished female in no acute distress.  ?HEENT:  Normocephalic, atraumatic. External right and left ear normal. No scleral icterus.  ?NECK: Normal range of motion, supple, no masses noted on observation ?SKIN: No rash noted. Not diaphoretic. No erythema. No pallor. ?MUSCULOSKELETAL: Normal  range of motion. No edema noted. ?NEUROLOGIC: Alert and oriented to person, place, and time. Normal muscle tone coordination. No cranial nerve deficit noted. ?PSYCHIATRIC: Normal mood and affect. Normal behavior. Normal judgment and thought content. ?CARDIOVASCULAR: Normal heart rate noted ?RESPIRATORY: Effort and breath sounds normal, no problems with respiration noted ?ABDOMEN: No masses noted. No other overt distention noted.   ?PELVIC: Deferred ? ?Pathology  ?03/05/2021 Colposcopy Biopsy pathology after ASC-H/AGC/HPV + pap smear ?1. Ectocervical biopsy, At 1 and 4 o'clock ?- HIGH-GRADE SQUAMOUS INTRAEPITHELIAL LESION (HSIL)/CIN-2. ?2. Endocervix, curettage ?- FEW MINUTE ATYPICAL EPITHELIAL FRAGMENTS OF UNCERTAIN SIGNIFICANCE, IN A BACKGROUND OF NON-DYSPLASTIC ENDOCERVICAL EPITHELIUM AND MUCOSA. ? ?03/16/2021 ENDOMETRIUM, CURETTAGE  ?-    Late secretory phase endometrium.  ?-    Negative for intraepithelial neoplasia (EIN) and malignancy.  ?   ?Assessment and Plan:  ?   ?1. Vaginal discharge ?Self swab done, will follow up results and manage accordingly. ?- Cervicovaginal ancillary only ? ?2. Atypical cervical glandular cells on 12/23/2020 pap smear ?Negative endometrial and endocervical sampling, reassuring ? ?3. 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 Recommended LEEP but patient is a little hesistant to do this.  Cryotherapy was also discussed as alternative, but LEEP is preferred. Details of both procedures discussed, all questions answered.  Information given to her to review. She will make decision by her appointment, premedication with Ibuprofen recommended. ? ?Routine preventative health maintenance measures emphasized. ?Please refer to After Visit Summary for other  counseling recommendations.  ? ?Return for LEEP (or Cryotherapy of Cervix) .   ? ?I spent 30 minutes dedicated to the care of this patient including pre-visit  review of records, face to face time with the patient discussing her conditions and treatments and post visit orders. ? ? ? ?Jaynie Collins, MD, FACOG ?Obstetrician Heritage manager, Faculty Practice ?[facility] [employer], [facility] ? ? ? ? ? ? ?

## 2021-04-05 NOTE — Patient Instructions (Addendum)
Reason for intervention is CIN 2 (moderate to severe dysplasia of cervix) - abnormal cervical cells, precancerous cells. ? ?LEEP is preferred.  ? ?Cervical cryotherapy ?Cervical cryotherapy is a procedure which involves freezing an area of abnormal tissue on the cervix. This tissue gradually disappears and the cervix heals. One cervical cryotherapy is usually sufficient to destroy the abnormal tissue.  ?Purpose  ?Cervical cryotherapy is a standard method used to treat cervical dysplasia, meaning the removal of abnormal cell tissue on the cervix.  ?Description  ?Cervical cryotherapy, or freezing, usually lasts about five minutes and causes a slight amount of discomfort. The procedure is usually performed in an outpatient setting.  ?Cervical cryotherapy is done by placing a small freeze-probe (cryoprobe) against the cervix that cools the cervix to sub-zero temperatures. The cells destroyed by freezing are shed afterwards in a heavy watery discharge. The main advantage of cryotherapy is that it is a simple procedure that requires inexpensive equipment.  ?The cryogenic device consists of a gas tank containing a refrigerant and non-explosive, non-toxic gas (usually nitrous oxide). The gas is delivered using flexible tubing through a gun-type attachment to the cryoprobe.  ?Diagnosis/Preparation  ?Women who undergo cervical cryotherapy typically have had an abnormal Pap smear which has led to a diagnosis of cervical squamous dysplasia and usually confirmed by biopsy after an adequate colposcopic exam.  ?Preparation for cervical cryotherapy involves scheduling the procedure when the patient is not experiencing heavy menstrual flow. Ibuprofen or naproxen sodium may be given before cryotherapy to decrease cramping. If there is any doubt about the pregnancy status, a pregnancy test is performed.  ?Aftercare  ?Cervical cryotherapy is often followed by a heavy and often odorous discharge during the first month after the  procedure. The discharge is due to the dead tissue cells leaving the treatment site. The patient should abstain from sexual intercourse and not use tampons for a period of two weeks after the procedure. Excessive exercise should also be avoided to lessen the occurrence of post-therapy bleeding.  ?Risks  ?The following risks have been associated with cervical cryotherapy:  ?Uterine cramping. Often occurs during the cryotherapy but rapidly subsides after treatment.  ?Bleeding and infection. Rare, but incidences have been reported.  ?More difficult Pap smears. Future Pap smears and colposcopy may be more difficult after cryotherapy.  ?Normal results  ?A normal result is no recurrence of the abnormal cervix cells. The first follow-up Pap smear is done at 6 months then 12 months after the procedure. If normal, patients resume usual pap smear screening.  If any, recurrences usually occur within two years of treatment.  If a follow-up Pap smear is abnormal, a colposcopy with biopsy is usually performed. Other treatment methods, usually the loop electrocautery excision procedure (LEEP) are then used if persistent disease is discovered.  ?Following the procedure, it is considered normal to experience the following:  ?slight cramping for two to three days  ?watery discharge requiring several pad changes daily  ?Bloody or brown discharge, especially 12-16 days after the procedure  ?Alternatives  ?Loop electrocautery excision procedure (LEEP). This procedure uses a fine wire loop with an electric current flowing through it to remove the desired area of the cervix. Loop excision is usually done under local anesthesia and causes very little discomfort.  ? ? ? ?Loop Electrosurgical Excision Procedure ?Loop electrosurgical excision procedure (LEEP) is the cutting and removal (excision) of tissue from the cervix. The cervix is the bottom part of the uterus that opens into the vagina. The tissue  that is removed from the cervix is  examined to see if there are cancer cells or cells that might turn into cancer (precancerous cells). LEEP may be done when: ?You have abnormal bleeding from your cervix. ?You have an abnormal Pap test result. ?Your health care provider finds abnormalities on your cervix during an exam. ?LEEP typically only takes a few minutes and is often done in the health care provider's office. The procedure is safe for women who are trying to get pregnant. The procedure is usually not done during a menstrual period or during pregnancy. ?Tell a health care provider about: ?Any allergies you have. ?All medicines you are taking, including vitamins, herbs, eye drops, creams, and over-the-counter medicines. ?Any problems you or family members have had with anesthetic medicines. ?Any bleeding problems you have. ?Any medical conditions you have or have had. This includes current or past vaginal infections, such as herpes or STIs (sexually transmitted infections). ?Whether you are pregnant or may be pregnant. ?If you are having vaginal bleeding on the day of the procedure. ?What are the risks? ?Generally, this is a safe procedure. However, problems may occur, including: ?Infection. ?Bleeding. ?Allergic reactions to medicines. ?Changes or scarring in the cervix. ?Damage to nearby structures or organs. ?Increased risk of early (preterm) labor in future pregnancies. ?What happens before the procedure? ?Ask your health care provider about: ?Changing or stopping your regular medicines. This is especially important if you are taking diabetes medicines or blood thinners. ?Taking medicines such as aspirin and ibuprofen. These medicines can thin your blood. Do not take these medicines unless your health care provider tells you to take them. ?Taking over-the-counter medicines, vitamins, herbs, and supplements. ?Your health care provider may recommend that you take pain medicine before the procedure. ?Ask your health care provider if you should  plan to have a responsible adult take you home after the procedure. ?What happens during the procedure? ? ?An instrument called a speculum will be placed in your vagina. This will allow your health care provider to see your cervix. ?You will be given a medicine to numb the area (local anesthetic). The medicine will be injected into your cervix and the surrounding area. ?A solution will be applied to your cervix. This solution will help the health care provider find the abnormal cells that need to be removed. ?A thin wire loop will be passed through your vagina to your cervix. The wire will remove layers of abnormal cervical cells. ?The wire will burn (cauterize) the cervical tissue with an electrical current during cell removal. ?Open blood vessels will be cauterized to prevent bleeding. ?You might feel some pressure, aching, and cramping. If you feel like you will faint during the procedure, tell your health care provider right away. ?A paste may be applied to the cauterized area of your cervix to help control bleeding. ?The sample of cervical tissue will be sent to a lab and looked at under a microscope. ?The procedure may vary among health care providers and hospitals. ?What can I expect after the procedure? ?After the procedure, it is common to have: ?Mild abdominal cramps that may last for up to 1 week. ?A small amount of pink-tinged or bloody vaginal discharge, including light to moderate bleeding, for 1-2 weeks. ?A brown- or black-colored discharge coming from your vagina, if a paste was used on the cervix to control bleeding. ?It is up to you to get the results of your procedure. Ask your health care provider, or the department that is doing  the procedure, when your results will be ready. ?Follow these instructions at home: ?Take over-the-counter and prescription medicines only as told by your health care provider. ?Return to your normal activities as told by your health care provider. Ask your health care  provider what activities are safe for you. ?Do not put anything in your vagina for 2 weeks after the procedure or until your health care provider says that it is okay. This includes tampons, creams, and douches. ?D

## 2021-04-07 ENCOUNTER — Telehealth: Payer: Self-pay | Admitting: *Deleted

## 2021-04-07 LAB — CERVICOVAGINAL ANCILLARY ONLY
Bacterial Vaginitis (gardnerella): POSITIVE — AB
Candida Glabrata: NEGATIVE
Candida Vaginitis: NEGATIVE
Chlamydia: NEGATIVE
Comment: NEGATIVE
Comment: NEGATIVE
Comment: NEGATIVE
Comment: NEGATIVE
Comment: NEGATIVE
Comment: NORMAL
Neisseria Gonorrhea: NEGATIVE
Trichomonas: NEGATIVE

## 2021-04-07 MED ORDER — METRONIDAZOLE 500 MG PO TABS: 500.0000 mg | ORAL_TABLET | Freq: Two times a day (BID) | ORAL | 0 refills | Status: AC

## 2021-04-07 MED ORDER — METRONIDAZOLE 0.75 % VA GEL: 1.0000 | Freq: Every day | VAGINAL | 1 refills | Status: DC

## 2021-04-07 NOTE — Telephone Encounter (Signed)
Pt informed of swab results and would gel called  ?

## 2021-04-07 NOTE — Addendum Note (Signed)
Addended by: Jaynie Collins A on: 04/07/2021 03:49 PM ? ? Modules accepted: Orders ? ?

## 2021-04-12 ENCOUNTER — Encounter: Payer: Medicaid Other | Admitting: Obstetrics & Gynecology

## 2021-05-10 ENCOUNTER — Encounter: Payer: Medicaid Other | Admitting: Obstetrics & Gynecology

## 2021-05-12 ENCOUNTER — Telehealth: Payer: Self-pay | Admitting: *Deleted

## 2021-05-12 ENCOUNTER — Other Ambulatory Visit: Payer: Self-pay | Admitting: Obstetrics & Gynecology

## 2021-05-12 DIAGNOSIS — N871 Moderate cervical dysplasia: Secondary | ICD-10-CM

## 2021-05-12 NOTE — Telephone Encounter (Signed)
Called pt to see if she wanted to reschedule her LEEP or if she would prefer to do it in the OR, pt would like to do in the OR and will let Dr A know.  ?

## 2021-05-12 NOTE — Progress Notes (Signed)
Patient missed her appointment for LEEP on 05/10/2021 for CIN 2.  She was called and she reports she wants this procedure to be done in OR under anesthesia, she does not want to do this in office. Will send surgical request and patient will be contacted with details for this operating room procedure. ? ? ?Carmen Schneiders, MD ? ?

## 2021-05-16 ENCOUNTER — Emergency Department (HOSPITAL_COMMUNITY): Payer: Medicaid Other

## 2021-05-16 ENCOUNTER — Other Ambulatory Visit: Payer: Self-pay

## 2021-05-16 ENCOUNTER — Emergency Department (HOSPITAL_COMMUNITY)
Admission: EM | Admit: 2021-05-16 | Discharge: 2021-05-16 | Discharge status: Against Medical Advice | Payer: Medicaid Other | Attending: Emergency Medicine | Admitting: Emergency Medicine

## 2021-05-16 ENCOUNTER — Encounter (HOSPITAL_COMMUNITY): Payer: Self-pay

## 2021-05-16 DIAGNOSIS — R079 Chest pain, unspecified: Secondary | ICD-10-CM | POA: Insufficient documentation

## 2021-05-16 DIAGNOSIS — M25511 Pain in right shoulder: Secondary | ICD-10-CM | POA: Insufficient documentation

## 2021-05-16 DIAGNOSIS — Z5321 Procedure and treatment not carried out due to patient leaving prior to being seen by health care provider: Secondary | ICD-10-CM | POA: Diagnosis not present

## 2021-05-16 DIAGNOSIS — R002 Palpitations: Secondary | ICD-10-CM | POA: Insufficient documentation

## 2021-05-16 DIAGNOSIS — R0789 Other chest pain: Secondary | ICD-10-CM | POA: Diagnosis not present

## 2021-05-16 LAB — BASIC METABOLIC PANEL
Anion gap: 7 (ref 5–15)
BUN: 10 mg/dL (ref 6–20)
CO2: 23 mmol/L (ref 22–32)
Calcium: 9.2 mg/dL (ref 8.9–10.3)
Chloride: 108 mmol/L (ref 98–111)
Creatinine, Ser: 0.85 mg/dL (ref 0.44–1.00)
GFR, Estimated: >60 mL/min (ref >60)
Glucose, Bld: 103 mg/dL — ABNORMAL HIGH (ref 70–99)
Potassium: 3.7 mmol/L (ref 3.5–5.1)
Sodium: 138 mmol/L (ref 135–145)

## 2021-05-16 LAB — TSH: TSH: 2.185 u[IU]/mL (ref 0.350–4.500)

## 2021-05-16 LAB — CBC
HCT: 38.1 % (ref 36.0–46.0)
Hemoglobin: 12.3 g/dL (ref 12.0–15.0)
MCH: 27.8 pg (ref 26.0–34.0)
MCHC: 32.3 g/dL (ref 30.0–36.0)
MCV: 86 fL (ref 80.0–100.0)
Platelets: 250 10*3/uL (ref 150–400)
RBC: 4.43 MIL/uL (ref 3.87–5.11)
RDW: 12.8 % (ref 11.5–15.5)
WBC: 7.7 10*3/uL (ref 4.0–10.5)
nRBC: 0 % (ref 0.0–0.2)

## 2021-05-16 LAB — MAGNESIUM: Magnesium: 2.2 mg/dL (ref 1.7–2.4)

## 2021-05-16 LAB — TROPONIN I (HIGH SENSITIVITY): Troponin I (High Sensitivity): <2 ng/L (ref <18)

## 2021-05-16 NOTE — ED Triage Notes (Signed)
Patient c/o right shoulder pain x 2 weeks, right chest pain that started today and states she noted she had heart palpitations on the right side . Patient denies any SOB. ?

## 2021-05-16 NOTE — ED Provider Triage Note (Signed)
Emergency Medicine Provider Triage Evaluation Note ? ?Carmen Johnston , a 41 y.o. female  was evaluated in triage.  Pt complains of right shoulder pain for several weeks, works as a Lawyer, suspected she may have pulled something however now has pain radiating down her right arm as well as palpitations on the right side of her chest.  Palpitations new today, lasting a few seconds, resolves briefly and then returns.  Non-smoker, does admit to caffeine use.  No associated nausea, diaphoresis, shortness of breath, no significant family cardiac history.. ? ?Review of Systems  ?Positive: Palpitations, right shoulder pain ?Negative: Shortness of breath ? ?Physical Exam  ?BP (!) 146/104 (BP Location: Left Arm)   Temp 98.4 ?F (36.9 ?C) (Oral)   Resp (!) 24   SpO2 99%  ?Gen:   Awake, no distress   ?Resp:  Normal effort  ?MSK:   Moves extremities without difficulty  ?Other:  HR RRR, lungs CTA ? ?Medical Decision Making  ?Medically screening exam initiated at 4:11 PM.  Appropriate orders placed.  HELEN WINTERHALTER was informed that the remainder of the evaluation will be completed by another provider, this initial triage assessment does not replace that evaluation, and the importance of remaining in the ED until their evaluation is complete. ? ? ?  ?Jeannie Fend, PA-C ?05/16/21 1612 ? ?

## 2021-05-24 ENCOUNTER — Encounter (HOSPITAL_COMMUNITY): Payer: Self-pay | Admitting: Certified Registered Nurse Anesthetist

## 2021-05-24 ENCOUNTER — Other Ambulatory Visit: Payer: Self-pay

## 2021-05-24 ENCOUNTER — Encounter (HOSPITAL_COMMUNITY): Payer: Self-pay | Admitting: Obstetrics & Gynecology

## 2021-05-24 NOTE — Progress Notes (Signed)
Ms Dorion denies chest pain or shortness of breath.  Patient denies having any s/s of Covid in her household.  Patient denies any known exposure to Covid..   I instructed Ms Pyeatt to not take any more vitamins or Ibuprofen.

## 2021-05-24 NOTE — H&P (Incomplete)
   Patient was scheduled for LEEP in the OR today but called preoperative area and reported she had transportation issues and could not come. She left a message wanting to be rescheduled.  Patient will be contacted with a rescheduled date and time for her LEEP.   Jaynie Collins, MD, FACOG Obstetrician & Gynecologist, [facility] for [employer], [facility]

## 2021-05-25 NOTE — Progress Notes (Signed)
Called patient and she said she would need to reschedule.  She doesn't have a ride here or home afterward. Dr. Harolyn Rutherford, Dr. Ola Spurr and OR main desk all aware.

## 2021-05-25 NOTE — Progress Notes (Signed)
    Patient was scheduled for LEEP in the OR today but called preoperative area and reported she had transportation issues and could not come. She left a message wanting to be rescheduled.  Patient will be contacted with a rescheduled date and time for her LEEP.   Courvoisier Hamblen, MD, FACOG Obstetrician & Gynecologist, [facility], [facility]  

## 2021-06-01 NOTE — Progress Notes (Signed)
Unable to reach pt with both listed numbers. Instructions sent via text to the pt.

## 2021-06-01 NOTE — Progress Notes (Addendum)
Pt called back to express that she knew nothing about her having surgery tomorrow 6/1. Pt states that she has not spoken to Dr. Arther Abbott office in about a month. The pt states she might be pregnant, and has a scheduled appointment to receive an "abortion pill" tomorrow morning. She then asked if she could still have her procedure after receiving this medicine, and she was given the number to Dr. Arther Abbott office to speak to an after hours physician regarding the matter. Pt advised to call back to the hospital to update the staff if she will be arriving for her procedure. Asked the pt if she received the sent text messages with the pre-op instructions, and she replied, "Yes". Pt advised to follow those instructions if the doctor states she can still have her procedure, verbal instructions were also given to verify. Awaiting the pt's update.

## 2021-06-02 ENCOUNTER — Ambulatory Visit (HOSPITAL_COMMUNITY)
Admission: RE | Admit: 2021-06-02 | Payer: Medicaid Other | Source: Home / Self Care | Admitting: Obstetrics & Gynecology

## 2021-06-02 HISTORY — DX: Anxiety disorder, unspecified: F41.9

## 2021-06-02 SURGERY — LEEP (LOOP ELECTROSURGICAL EXCISION PROCEDURE)
Anesthesia: Choice

## 2021-06-08 DIAGNOSIS — H5213 Myopia, bilateral: Secondary | ICD-10-CM | POA: Diagnosis not present

## 2021-07-08 ENCOUNTER — Encounter (HOSPITAL_BASED_OUTPATIENT_CLINIC_OR_DEPARTMENT_OTHER): Payer: Self-pay | Admitting: Obstetrics & Gynecology

## 2021-07-08 NOTE — Progress Notes (Signed)
Spoke w/ via phone for pre-op interview--- pt Lab needs dos----  urine preg             Lab results------ no COVID test -----patient states asymptomatic no test needed Arrive at ------- 1345 on 07-20-2021 NPO after MN NO Solid Food.  Clear liquids from MN until--- 1245 Med rec completed Medications to take morning of surgery ----- none Diabetic medication ----- n/a Patient instructed no nail polish to be worn day of surgery Patient instructed to bring photo id and insurance card day of surgery Patient aware to have Driver (ride ) / caregiver for 24 hours after surgery -- father, Carmen Johnston Patient Special Instructions ----- n/a Pre-Op special Istructions ----- n/a Patient verbalized understanding of instructions that were given at this phone interview. Patient denies shortness of breath, chest pain, fever, cough at this phone interview.

## 2021-07-19 ENCOUNTER — Encounter (HOSPITAL_BASED_OUTPATIENT_CLINIC_OR_DEPARTMENT_OTHER): Payer: Self-pay | Admitting: Anesthesiology

## 2021-07-20 ENCOUNTER — Ambulatory Visit (HOSPITAL_BASED_OUTPATIENT_CLINIC_OR_DEPARTMENT_OTHER)
Admission: RE | Admit: 2021-07-20 | Payer: Medicaid Other | Source: Home / Self Care | Admitting: Obstetrics & Gynecology

## 2021-07-20 DIAGNOSIS — Z01818 Encounter for other preprocedural examination: Secondary | ICD-10-CM

## 2021-07-20 HISTORY — DX: Moderate cervical dysplasia: N87.1

## 2021-07-20 HISTORY — DX: Personal history of other infectious and parasitic diseases: Z86.19

## 2021-07-20 SURGERY — LEEP (LOOP ELECTROSURGICAL EXCISION PROCEDURE)
Anesthesia: Choice

## 2021-08-16 ENCOUNTER — Ambulatory Visit (HOSPITAL_COMMUNITY)
Admission: EM | Admit: 2021-08-16 | Discharge: 2021-08-16 | Disposition: A | Discharge status: Home / Self Care | Payer: Medicaid Other | Attending: Family Medicine | Admitting: Family Medicine

## 2021-08-16 ENCOUNTER — Encounter (HOSPITAL_COMMUNITY): Payer: Self-pay | Admitting: *Deleted

## 2021-08-16 DIAGNOSIS — B86 Scabies: Secondary | ICD-10-CM | POA: Diagnosis not present

## 2021-08-16 MED ORDER — PERMETHRIN 5 % EX CREA: TOPICAL_CREAM | CUTANEOUS | 1 refills | Status: DC

## 2021-08-16 NOTE — ED Provider Notes (Signed)
[facility]    CSN: [id number] Arrival date & time: 08/16/21  5784      History   Chief Complaint Chief Complaint  Patient presents with   Pruritis    HPI Carmen Johnston is a 41 y.o. female.   HPI Here for itching and rash that she has noticed on her wrist, popliteal areas, and her ankles.  The rash that she is seeing is fairly linear in nature.  It feels like when she had scabies sometime ago.  No fever.  No cough or upper respiratory symptoms.  No vomiting or diarrhea.  Her last menstrual cycle just finished.  Past Medical History:  Diagnosis Date   Anemia    Anxiety    CIN II (cervical intraepithelial neoplasia II)    GERD (gastroesophageal reflux disease)    History of chlamydia    History of COVID-19 2022   per pt mild symptoms that resolved   History of trichomonal vaginitis    hx recurrent ,  last episode 03-11-2021   Hx of abnormal cervical Pap smear 12/23/2020   ASC-H/AGC/HPV pos. Had colposcopy in office on 03/15/21, Hysteroscopy, D&C on 03/16/21    Patient Active Problem List   Diagnosis Date Noted   Dysplasia of cervix, high grade CIN 2 on 03/05/2021 colposcopy biopsy 03/21/2021   Atypical cervical glandular cells on 12/23/2020 pap smear 03/14/2021   Atypical squamous cells cannot exclude high grade squamous intraepithelial lesion (ASC-H) on 12/23/2020 pap smear 03/14/2021   Cervical high risk HPV (human papillomavirus) test positive on 12/23/2020 pap smear 03/14/2021    Past Surgical History:  Procedure Laterality Date   HYSTEROSCOPY WITH D & C N/A 03/16/2021   Procedure: DILATATION AND CURETTAGE /HYSTEROSCOPY;  Surgeon: Carmen Newcomer, Carmen Johnston;  Location: [facility];  Service: Gynecology;  Laterality: N/A;   WISDOM TOOTH EXTRACTION     teen    OB History     Gravida  8   Para  4   Term  4   Preterm  0   AB  3   Living  4      SAB  1   IAB  1   Ectopic  1   Multiple      Live Births  4             Home Medications    Prior to Admission medications   Medication Sig Start Date End Date Taking? Authorizing Provider  permethrin (ELIMITE) 5 % cream Apply to affected area once; you can repeat in 2 weeks if need 08/16/21  Yes Carmen Johnston, Carmen Aris, Carmen Johnston  calcium carbonate (TUMS - DOSED IN MG ELEMENTAL CALCIUM) 500 MG chewable tablet Chew 1 tablet by mouth as needed for indigestion or heartburn.    Provider, Historical, Carmen Johnston  Cholecalciferol (VITAMIN D-3 PO) Take 1 tablet by mouth daily.    Provider, Historical, Carmen Johnston  Multiple Vitamins-Minerals (CENTRUM ADULTS PO) Take 1 tablet by mouth daily.    Provider, Historical, Carmen Johnston  fluticasone (FLONASE) 50 MCG/ACT nasal spray Place 2 sprays into both nostrils daily. 01/16/20 04/15/20  Carmen Shadow, Carmen Johnston  omeprazole (PRILOSEC) 40 MG capsule Take 1 capsule (40 mg total) by mouth daily. 04/16/18 11/26/18  Carmen Rist, Carmen Johnston    Family History Family History  Problem Relation Age of Onset   Mental illness Mother    Anesthesia problems Neg Hx    Stomach cancer Neg Hx    Colon cancer Neg Hx  Pancreatic cancer Neg Hx     Social History Social History   Tobacco Use   Smoking status: Former    Packs/day: 0.25    Years: 10.00    Total pack years: 2.50    Types: Cigarettes    Quit date: 01/04/2017    Years since quitting: 4.6   Smokeless tobacco: Never  Vaping Use   Vaping Use: Never used  Substance Use Topics   Alcohol use: Yes    Comment: occasionally   Drug use: Yes    Types: Marijuana     Allergies   Patient has no known allergies.   Review of Systems Review of Systems   Physical Exam Triage Vital Signs ED Triage Vitals [08/16/21 1005]  Enc Vitals Group     BP (!) 151/91     Pulse Rate 65     Resp 16     Temp 98.2 F (36.8 C)     Temp Source Oral     SpO2 97 %     Weight      Height      Head Circumference      Peak Flow      Pain Score 0     Pain Loc      Pain Edu?      Excl. in GC?    No data found.  Updated  Vital Signs BP (!) 151/91   Pulse 65   Temp 98.2 F (36.8 C) (Oral)   Resp 16   LMP 08/11/2021 (Exact Date)   SpO2 97%   Breastfeeding No   Visual Acuity Right Eye Distance:   Left Eye Distance:   Bilateral Distance:    Right Eye Near:   Left Eye Near:    Bilateral Near:     Physical Exam Vitals and nursing note reviewed.  Constitutional:      General: She is not in acute distress.    Appearance: She is not ill-appearing, toxic-appearing or diaphoretic.  Skin:    Comments: There is faint rash on her ankles that is in a linear distribution.  There is also is a little bit on her volar wrists bilaterally  Neurological:     Mental Status: She is alert and oriented to person, place, and time.  Psychiatric:        Behavior: Behavior normal.      UC Treatments / Results  Labs (all labs ordered are listed, but only abnormal results are displayed) Labs Reviewed - No data to display  EKG   Radiology No results found.  Procedures Procedures (including critical care time)  Medications Ordered in UC Medications - No data to display  Initial Impression / Assessment and Plan / UC Course  I have reviewed the triage vital signs and the nursing notes.  Pertinent labs & imaging results that were available during my care of the patient were reviewed by me and considered in my medical decision making (see chart for details).     Prescription is sent for the Elimite cream.  She states this was effective previously. Final Clinical Impressions(s) / UC Diagnoses   Final diagnoses:  Scabies     Discharge Instructions      Use the elimite cream according to the package instructions      ED Prescriptions     Medication Sig Dispense Auth. Provider   permethrin (ELIMITE) 5 % cream Apply to affected area once; you can repeat in 2 weeks if need 60 g Carmen Johnston, Carmen Aris, Carmen Johnston  PDMP not reviewed this encounter.   Carmen Resides, Carmen Johnston 08/16/21 1045

## 2021-08-16 NOTE — Discharge Instructions (Addendum)
Use the elimite cream according to the package instructions

## 2021-08-16 NOTE — ED Triage Notes (Signed)
Pt believes she has scabies; c/o pruritis to bilat hands, wrists, knees, and ankles; states she has had in past. No lesions noted at this time.

## 2021-08-22 ENCOUNTER — Telehealth: Payer: Self-pay

## 2021-08-22 NOTE — Telephone Encounter (Signed)
Called patient and spoke to her regarding getting her surgery rescheduled. Patient opted for 09/13 surgery date, explained to patient that if she no shows this surgery date, she will not be rescheduled. Patient expressed understanding.

## 2021-08-22 NOTE — Telephone Encounter (Signed)
----- Message from Tereso Newcomer, MD sent at 08/11/2021 11:02 AM EDT ----- Regarding: RE: Surgery 07/19 - patient was a no-show With her increased risk of cervical cancer if this goes untreated...  Rhea Bleacher, please try to reach out to her one more time to schedule this LEEP.   She needs to know this is the last time this will be scheduled.  Thank you!  Jaynie Collins, MD  ----- Message ----- From: Alvin Critchley Sent: 08/11/2021  10:59 AM EDT To: Tereso Newcomer, MD; Olevia Bowens Subject: RE: Surgery 07/19 - patient was a no-show      Also patient stated no one called to notify her of the appointment prior to the hospital calling to confirm. Just wanted to clarify that.   ----- Message ----- From: Olevia Bowens Sent: 08/11/2021  10:25 AM EDT To: Tereso Newcomer, MD; Alvin Critchley Subject: RE: Surgery 07/19 - patient was a no-show      Surgery days are not negotiable, either she can do the day or she can't. All MDs only operate about 1 4hr block per month. She has been scheduled multiple times and had plenty of notice to contact us if that day did not work.   Dr. Macon Large how would you like to proceed with this patient? ----- Message ----- From: Alvin Critchley Sent: 08/11/2021  10:08 AM EDT To: Tereso Newcomer, MD; Olevia Bowens Subject: RE: Surgery 07/19 - patient was a no-show      Patient called in stating when she was informed of the procedure by hospital staff, she indicated to them that day/time will not work, she was told to call office. Pt stated she never received a call to get appointment scheduled, that it was just done and that wasn't a good day or time for her. Pt is requesting scheduler contacts her by phone to see when she is available.   ----- Message ----- From: Tereso Newcomer, MD Sent: 07/20/2021   2:03 PM EDT To: Clovis Riley, RN; Olevia Bowens; # Subject: RE: Surgery 07/19 - patient was a no-show      This is the third time this  LEEP has been scheduled in the OR and the patient will not allow for office LEEP.  Patient needs to be reminded she has high grade cervical dysplasia that can progress to cervical cancer if untreated.  We have tried to treat her with excision multiple times.   It is up to her now to arrange for this excision to be performed.   Thank you!   Jaynie Collins, MD  ----- Message ----- From: Olevia Bowens Sent: 06/03/2021   9:24 AM EDT To: Tereso Newcomer, MD; Alvin Critchley; # Subject: Surgery 07/19                                  Rescheduled to 07/19 @1500  w/ Anyanwu ----- Message ----- From: Sent: 05/17/2021   4:16 PM EDT To: 05/19/2021, MD, Tereso Newcomer, # Subject: Surgery 05/24                                  Done. Posted for 05/25/2021 @1500  w/ Anyanwu CPT 05/27/2021 Dx [id number] ----- Message ----- From: [id number], MD Sent: 05/12/2021   1:35 PM EDT  To: Tera Partridge Battle Subject: Needs LEEP in OR                               LEEP under anesthesia  CIN 2, anxiety about office procedures   Thank you!   (Will need Lugol's solution, Cone Biopsy Excisors -small, medium, large)    Jaynie Collins, MD

## 2021-08-31 ENCOUNTER — Encounter (HOSPITAL_BASED_OUTPATIENT_CLINIC_OR_DEPARTMENT_OTHER): Payer: Self-pay | Admitting: Obstetrics & Gynecology

## 2021-08-31 NOTE — Progress Notes (Signed)
Spoke w/ via phone for pre-op interview--- pt Lab needs dos----  cbc, urine preg             Lab results------ no COVID test -----patient states asymptomatic no test needed Arrive at ------- 1130 on 09-14-2021 NPO after MN NO Solid Food.  Clear liquids from MN until--- 1030 Med rec completed Medications to take morning of surgery ----- none Diabetic medication ----- n/a Patient instructed no nail polish to be worn day of surgery Patient instructed to bring photo id and insurance card day of surgery Patient aware to have Driver (ride ) / caregiver for 24 hours after surgery --- friend, sherad Patient Special Instructions ----- n/a Pre-Op special Istructions ----- n/a Patient verbalized understanding of instructions that were given at this phone interview. Patient denies shortness of breath, chest pain, fever, cough at this phone interview.

## 2021-09-12 ENCOUNTER — Telehealth: Payer: Self-pay | Admitting: Obstetrics & Gynecology

## 2021-09-12 NOTE — Telephone Encounter (Signed)
Telephone call to patient to notify her of surgery time being moved up.    Patient will be a 10:30 am start now and will need to arrive at 8:30 am to [facility].   MyChart message sent as well as detailed voicemail.

## 2021-09-14 ENCOUNTER — Ambulatory Visit (HOSPITAL_BASED_OUTPATIENT_CLINIC_OR_DEPARTMENT_OTHER): Payer: Medicaid Other | Admitting: Anesthesiology

## 2021-09-14 ENCOUNTER — Encounter (HOSPITAL_BASED_OUTPATIENT_CLINIC_OR_DEPARTMENT_OTHER): Payer: Self-pay | Admitting: Obstetrics & Gynecology

## 2021-09-14 ENCOUNTER — Ambulatory Visit (HOSPITAL_BASED_OUTPATIENT_CLINIC_OR_DEPARTMENT_OTHER)
Admission: RE | Admit: 2021-09-14 | Discharge: 2021-09-14 | Disposition: A | Discharge status: Home / Self Care | Payer: Medicaid Other | Attending: Obstetrics & Gynecology | Admitting: Obstetrics & Gynecology

## 2021-09-14 ENCOUNTER — Encounter (HOSPITAL_BASED_OUTPATIENT_CLINIC_OR_DEPARTMENT_OTHER)
Admission: RE | Disposition: A | Discharge status: Home / Self Care | Payer: Self-pay | Source: Home / Self Care | Attending: Obstetrics & Gynecology

## 2021-09-14 ENCOUNTER — Other Ambulatory Visit: Payer: Self-pay

## 2021-09-14 DIAGNOSIS — N871 Moderate cervical dysplasia: Secondary | ICD-10-CM

## 2021-09-14 DIAGNOSIS — R87611 Atypical squamous cells cannot exclude high grade squamous intraepithelial lesion on cytologic smear of cervix (ASC-H): Secondary | ICD-10-CM | POA: Diagnosis present

## 2021-09-14 DIAGNOSIS — R87619 Unspecified abnormal cytological findings in specimens from cervix uteri: Secondary | ICD-10-CM | POA: Diagnosis present

## 2021-09-14 DIAGNOSIS — Z87891 Personal history of nicotine dependence: Secondary | ICD-10-CM | POA: Diagnosis not present

## 2021-09-14 DIAGNOSIS — K219 Gastro-esophageal reflux disease without esophagitis: Secondary | ICD-10-CM | POA: Insufficient documentation

## 2021-09-14 DIAGNOSIS — R87810 Cervical high risk human papillomavirus (HPV) DNA test positive: Secondary | ICD-10-CM | POA: Diagnosis present

## 2021-09-14 DIAGNOSIS — Z01818 Encounter for other preprocedural examination: Secondary | ICD-10-CM

## 2021-09-14 HISTORY — PX: LEEP: SHX91

## 2021-09-14 HISTORY — DX: Personal history of other infectious and parasitic diseases: Z86.19

## 2021-09-14 LAB — CBC
HCT: 38.4 % (ref 36.0–46.0)
Hemoglobin: 11.4 g/dL — ABNORMAL LOW (ref 12.0–15.0)
MCH: 24.8 pg — ABNORMAL LOW (ref 26.0–34.0)
MCHC: 29.7 g/dL — ABNORMAL LOW (ref 30.0–36.0)
MCV: 83.7 fL (ref 80.0–100.0)
Platelets: 267 10*3/uL (ref 150–400)
RBC: 4.59 MIL/uL (ref 3.87–5.11)
RDW: 13.9 % (ref 11.5–15.5)
WBC: 5.6 10*3/uL (ref 4.0–10.5)
nRBC: 0 % (ref 0.0–0.2)

## 2021-09-14 LAB — POCT PREGNANCY, URINE: Preg Test, Ur: NEGATIVE

## 2021-09-14 SURGERY — LEEP (LOOP ELECTROSURGICAL EXCISION PROCEDURE)
Anesthesia: General | Site: Cervix

## 2021-09-14 MED ORDER — ACETAMINOPHEN 500 MG PO TABS
1000.0000 mg | ORAL_TABLET | ORAL | Status: AC
  Administered 2021-09-14: 1000 mg via ORAL

## 2021-09-14 MED ORDER — LACTATED RINGERS IV SOLN: INTRAVENOUS | Status: DC

## 2021-09-14 MED ORDER — ACETAMINOPHEN 500 MG PO TABS
ORAL_TABLET | ORAL | Status: AC
  Filled 2021-09-14: qty 2

## 2021-09-14 MED ORDER — CELECOXIB 200 MG PO CAPS
ORAL_CAPSULE | ORAL | Status: AC
  Filled 2021-09-14: qty 2

## 2021-09-14 MED ORDER — FENTANYL CITRATE (PF) 100 MCG/2ML IJ SOLN
INTRAMUSCULAR | Status: DC | PRN
  Administered 2021-09-14 (×2): 25 ug via INTRAVENOUS
  Administered 2021-09-14: 50 ug via INTRAVENOUS

## 2021-09-14 MED ORDER — MIDAZOLAM HCL 5 MG/5ML IJ SOLN
INTRAMUSCULAR | Status: DC | PRN
  Administered 2021-09-14: 2 mg via INTRAVENOUS

## 2021-09-14 MED ORDER — ONDANSETRON HCL 4 MG/2ML IJ SOLN
INTRAMUSCULAR | Status: AC
  Filled 2021-09-14: qty 2

## 2021-09-14 MED ORDER — FENTANYL CITRATE (PF) 100 MCG/2ML IJ SOLN: 25.0000 ug | INTRAMUSCULAR | Status: DC | PRN

## 2021-09-14 MED ORDER — IBUPROFEN 600 MG PO TABS: 600.0000 mg | ORAL_TABLET | Freq: Four times a day (QID) | ORAL | 2 refills | Status: DC | PRN

## 2021-09-14 MED ORDER — FENTANYL CITRATE (PF) 100 MCG/2ML IJ SOLN
INTRAMUSCULAR | Status: AC
  Filled 2021-09-14: qty 2

## 2021-09-14 MED ORDER — DEXAMETHASONE SODIUM PHOSPHATE 4 MG/ML IJ SOLN
INTRAMUSCULAR | Status: DC | PRN
  Administered 2021-09-14: 5 mg via INTRAVENOUS

## 2021-09-14 MED ORDER — IODINE STRONG (LUGOLS) 5 % PO SOLN
ORAL | Status: DC | PRN
  Administered 2021-09-14: 0.1 mL

## 2021-09-14 MED ORDER — LIDOCAINE 2% (20 MG/ML) 5 ML SYRINGE
INTRAMUSCULAR | Status: DC | PRN
  Administered 2021-09-14: 60 mg via INTRAVENOUS

## 2021-09-14 MED ORDER — GABAPENTIN 300 MG PO CAPS
300.0000 mg | ORAL_CAPSULE | ORAL | Status: AC
  Administered 2021-09-14: 300 mg via ORAL

## 2021-09-14 MED ORDER — MIDAZOLAM HCL 2 MG/2ML IJ SOLN
INTRAMUSCULAR | Status: AC
  Filled 2021-09-14: qty 2

## 2021-09-14 MED ORDER — DEXAMETHASONE SODIUM PHOSPHATE 10 MG/ML IJ SOLN
INTRAMUSCULAR | Status: AC
  Filled 2021-09-14: qty 1

## 2021-09-14 MED ORDER — FERRIC SUBSULFATE (BULK) SOLN
Status: DC | PRN
  Administered 2021-09-14: 1

## 2021-09-14 MED ORDER — ACETAMINOPHEN 500 MG PO TABS: 1000.0000 mg | ORAL_TABLET | Freq: Once | ORAL | Status: DC

## 2021-09-14 MED ORDER — 0.9 % SODIUM CHLORIDE (POUR BTL) OPTIME
TOPICAL | Status: DC | PRN
  Administered 2021-09-14: 500 mL

## 2021-09-14 MED ORDER — LIDOCAINE HCL (PF) 2 % IJ SOLN
INTRAMUSCULAR | Status: AC
  Filled 2021-09-14: qty 5

## 2021-09-14 MED ORDER — PROPOFOL 10 MG/ML IV BOLUS
INTRAVENOUS | Status: DC | PRN
  Administered 2021-09-14: 180 mg via INTRAVENOUS

## 2021-09-14 MED ORDER — GABAPENTIN 300 MG PO CAPS
ORAL_CAPSULE | ORAL | Status: AC
  Filled 2021-09-14: qty 1

## 2021-09-14 MED ORDER — PROPOFOL 10 MG/ML IV BOLUS
INTRAVENOUS | Status: AC
  Filled 2021-09-14: qty 20

## 2021-09-14 MED ORDER — LIDOCAINE-EPINEPHRINE 2 %-1:100000 IJ SOLN
INTRAMUSCULAR | Status: DC | PRN
  Administered 2021-09-14: 10 mL

## 2021-09-14 MED ORDER — CELECOXIB 200 MG PO CAPS
400.0000 mg | ORAL_CAPSULE | ORAL | Status: AC
  Administered 2021-09-14: 400 mg via ORAL

## 2021-09-14 MED ORDER — ONDANSETRON HCL 4 MG/2ML IJ SOLN
INTRAMUSCULAR | Status: DC | PRN
  Administered 2021-09-14: 4 mg via INTRAVENOUS

## 2021-09-14 SURGICAL SUPPLY — 23 items
APL SWBSTK 6 STRL LF DISP (MISCELLANEOUS) ×1
APPLICATOR COTTON TIP 6 STRL (MISCELLANEOUS) ×1 IMPLANT
APPLICATOR COTTON TIP 6IN STRL (MISCELLANEOUS) ×1
CATH ROBINSON RED A/P 16FR (CATHETERS) IMPLANT
ELECT BALL LEEP 5MM RED (ELECTRODE) IMPLANT
ELECT LOOP LEEP RND 15X12 GRN (CUTTING LOOP)
ELECT LOOP LEEP RND 20X12 WHT (CUTTING LOOP)
ELECT REM PT RETURN 9FT ADLT (ELECTROSURGICAL) ×1
ELECTRODE LOOP LP RND 15X12GRN (CUTTING LOOP) IMPLANT
ELECTRODE LOOP LP RND 20X12WHT (CUTTING LOOP) IMPLANT
ELECTRODE REM PT RTRN 9FT ADLT (ELECTROSURGICAL) ×1 IMPLANT
EXTENDER ELECT LOOP LEEP 10CM (CUTTING LOOP) IMPLANT
GLOVE BIOGEL PI IND STRL 7.0 (GLOVE) ×3 IMPLANT
GLOVE ECLIPSE 7.0 STRL STRAW (GLOVE) ×1 IMPLANT
GOWN STRL REUS W/ TWL LRG LVL3 (GOWN DISPOSABLE) ×2 IMPLANT
GOWN STRL REUS W/TWL LRG LVL3 (GOWN DISPOSABLE) ×2
NS IRRIG 1000ML POUR BTL (IV SOLUTION) ×1 IMPLANT
PACK VAGINAL MINOR WOMEN LF (CUSTOM PROCEDURE TRAY) ×1 IMPLANT
PAD OB MATERNITY 4.3X12.25 (PERSONAL CARE ITEMS) ×1 IMPLANT
PENCIL SMOKE EVACUATOR (MISCELLANEOUS) ×1 IMPLANT
SCOPETTES 8  STERILE (MISCELLANEOUS) ×2
SCOPETTES 8 STERILE (MISCELLANEOUS) ×2 IMPLANT
TOWEL GREEN STERILE FF (TOWEL DISPOSABLE) ×2 IMPLANT

## 2021-09-14 NOTE — Op Note (Signed)
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 Jaynie Collins  INDICATIONS: 41 y.o. [id number] here for LEEP for CIN 2.  Risks, benefits, alternatives, and limitations of procedure explained to patient, including pain, bleeding, infection, failure to remove abnormal tissue and failure to cure dysplasia, need for repeat procedures, damage to pelvic organs, cervical scarring, cervical incompetence, surgical site problems and other postoperative/anesthesia complications.  Role of HPV, cervical dysplasia and need for close followup was empasized. Written informed consent was obtained.    ANESTHESIA:    General - LMA and local intracervical block. ESTIMATED BLOOD LOSS:5 ml SPECIMENS: LEEP cervical specimen COMPLICATIONS: None immediate  PROCEDURE IN DETAIL:  The patient had sequential compression devices applied to her lower extremities while in the preoperative area.  She was then taken to the operating room where general anesthesia was administered and was found to be adequate.  She was placed in the dorsal lithotomy/supine position, and was prepped and draped in a sterile manner.  After an adequate timeout was performed, attention was turned to her pelvis, and the bivalved coated speculum was placed in the patient's vagina. A grounding pad placed on the patient. Lugol's solution was applied to the cervix and areas of decreased uptake were noted around the transformation zone.   Local anesthesia was administered via an intracervical block using 10 ml of 2% Lidocaine with epinephrine. The suction was turned on and the Large Fisher Cone Biopsy Excisor on 60 Watts of coagulation current was used to excise the area of decreased uptake and excise the entire transformation zone. Excellent hemostasis was achieved using  roller ball coagulation set at 60 Watts coagulation current. Monsel's solution was then applied and the speculum was removed from the vagina. Specimen was sent to pathology.  ?The patient tolerated the procedure well. Post-operative instructions given to patient, including instruction to seek medical attention for persistent bright red bleeding, fever, abdominal/pelvic pain, dysuria, nausea or vomiting. She was also told about the possibility of having copious yellow to black tinged discharge for weeks. She was counseled to avoid anything in the vagina (sex/douching/tampons) for 3 weeks. She will have a 4 week post-operative check to assess wound healing, review results and discuss further management.    Jaynie Collins, MD, FACOG Obstetrician & Gynecologist, [facility] for [employer], [facility]

## 2021-09-14 NOTE — H&P (Signed)
Preoperative History and Physical  Carmen Johnston is a 41 y.o. [id number] here for surgical management of CIN 2 noted on 03/05/2021 colposcopy biopsy after having a pap smear on 12/23/2020 with Atypical squamous cells cannot exclude high grade squamous intraepithelial lesion (ASC-H), atypical cervical glandular cells and positive high risk HPV.  Of note,  patient declined to do this procedure in the office due to anxiety and has missed several previous scheduled OR procedures due to a myriad of reasons.  No significant preoperative concerns.  Proposed surgery: Loop Electrosurgical Excision Procedure (LEEP)  Past Medical History:  Diagnosis Date   Anemia    Anxiety    CIN II (cervical intraepithelial neoplasia II)    GERD (gastroesophageal reflux disease)    History of chlamydia    History of COVID-19 2022   per pt mild symptoms that resolved   History of scabies    Urgent care  visit 08-16-2021  in epic ---  dx scabies ,  treated with prescription cream    (08-31-2021  pt denies any skin rash)   History of trichomonal vaginitis    hx recurrent ,  last episode 03-11-2021   Hx of abnormal cervical Pap smear 12/23/2020   ASC-H/AGC/HPV pos. Had colposcopy in office on 03/15/21, Hysteroscopy, D&C on 03/16/21   Past Surgical History:  Procedure Laterality Date   HYSTEROSCOPY WITH D & C N/A 03/16/2021   Procedure: DILATATION AND CURETTAGE /HYSTEROSCOPY;  Surgeon: Osborne Oman, MD;  Location: [facility];  Service: Gynecology;  Laterality: N/A;   WISDOM TOOTH EXTRACTION     teen   OB History  Gravida Para Term Preterm AB Living  8 4 4  0 3 4  SAB IAB Ectopic Multiple Live Births  1 1 1   4     # Outcome Date GA Lbr Len/2nd Weight Sex Delivery Anes PTL Lv  8 Gravida           7 Term 07/20/11 20w1d15:36 / 00:12 2886 g M Vag-Spont EPI  LIV  6 Ectopic 2010             Birth Comments: MTX  5 Term 10/28/05    F Vag-Spont  N DEC  4 Term 07/04/04    M Vag-Spont  N LIV  3  Term 02/19/98    M Vag-Spont   LIV     Birth Comments: pre-eclampsia  2 SAB           1 IAB           Patient denies any other pertinent gynecologic issues.   No current facility-administered medications on file prior to encounter.   Current Outpatient Medications on File Prior to Encounter  Medication Sig Dispense Refill   calcium carbonate (TUMS - DOSED IN MG ELEMENTAL CALCIUM) 500 MG chewable tablet Chew 1 tablet by mouth as needed for indigestion or heartburn.     Cholecalciferol (VITAMIN D-3 PO) Take 1 tablet by mouth daily.     Multiple Vitamins-Minerals (CENTRUM ADULTS PO) Take 1 tablet by mouth daily.     permethrin (ELIMITE) 5 % cream Apply to affected area once; you can repeat in 2 weeks if need 60 g 1   [DISCONTINUED] fluticasone (FLONASE) 50 MCG/ACT nasal spray Place 2 sprays into both nostrils daily. 16 g 2   [DISCONTINUED] omeprazole (PRILOSEC) 40 MG capsule Take 1 capsule (40 mg total) by mouth daily. 90 capsule 0   No Active Allergies  Social History:  reports that she quit smoking about 4 years ago. Her smoking use included cigarettes. She has a 2.50 pack-year smoking history. She has never used smokeless tobacco. She reports current alcohol use. She reports current drug use. Drug: Marijuana.  Family History  Problem Relation Age of Onset   Mental illness Mother    Anesthesia problems Neg Hx    Stomach cancer Neg Hx    Colon cancer Neg Hx    Pancreatic cancer Neg Hx     Review of Systems: Pertinent items noted in HPI and remainder of comprehensive ROS otherwise negative.  PHYSICAL EXAM: Blood pressure (!) 129/98, pulse 81, temperature 98.3 F (36.8 C), resp. rate 16, height 5' 7"  (1.702 m), weight 89.7 kg, last menstrual period 08/17/2021, SpO2 100 %. CONSTITUTIONAL: Well-developed, well-nourished female in no acute distress.  HENT:  Normocephalic, atraumatic, External right and left ear normal. Oropharynx is clear and moist EYES: Conjunctivae and EOM are  normal. Pupils are equal, round, and reactive to light. No scleral icterus.  NECK: Normal range of motion, supple, no masses SKIN: Skin is warm and dry. No rash noted. Not diaphoretic. No erythema. No pallor. NEUROLOGIC: Alert and oriented to person, place, and time. Normal reflexes, muscle tone coordination. No cranial nerve deficit noted. PSYCHIATRIC: Normal mood and affect. Normal behavior. Normal judgment and thought content. CARDIOVASCULAR: Normal heart rate noted, regular rhythm RESPIRATORY: Effort and breath sounds normal, no problems with respiration noted ABDOMEN: Soft, nontender, nondistended. PELVIC: Deferred MUSCULOSKELETAL: Normal range of motion. No edema and no tenderness. 2+ distal pulses.  Labs: Results for orders placed or performed during the hospital encounter of 09/14/21 (from the past 336 hour(s))  CBC   Collection Time: 09/14/21  8:43 AM  Result Value Ref Range   WBC 5.6 4.0 - 10.5 K/uL   RBC 4.59 3.87 - 5.11 MIL/uL   Hemoglobin 11.4 (L) 12.0 - 15.0 g/dL   HCT 38.4 36.0 - 46.0 %   MCV 83.7 80.0 - 100.0 fL   MCH 24.8 (L) 26.0 - 34.0 pg   MCHC 29.7 (L) 30.0 - 36.0 g/dL   RDW 13.9 11.5 - 15.5 %   Platelets 267 150 - 400 K/uL   nRBC 0.0 0.0 - 0.2 %  Pregnancy, urine POC   Collection Time: 09/14/21  8:46 AM  Result Value Ref Range   Preg Test, Ur NEGATIVE NEGATIVE    Imaging Studies: No results found.  Assessment: Patient Active Problem List   Diagnosis Date Noted   Dysplasia of cervix, high grade CIN 2 on 03/05/2021 colposcopy biopsy 03/21/2021   Atypical cervical glandular cells on 12/23/2020 pap smear 03/14/2021   Atypical squamous cells cannot exclude high grade squamous intraepithelial lesion (ASC-H) on 12/23/2020 pap smear 03/14/2021   Cervical high risk HPV (human papillomavirus) test positive on 12/23/2020 pap smear 03/14/2021    Plan: Patient will undergo surgical management with Loop Electrosurgical Excision Procedure (LEEP).  Risks, benefits,  alternatives, and limitations of procedure explained to patient, including pain, bleeding, infection, failure to remove abnormal tissue and failure to cure dysplasia, need for repeat procedures, damage to pelvic organs, cervical scarring, cervical incompetence, surgical site problems and other postoperative/anesthesia complications.  Role of HPV, cervical dysplasia and need for close followup was empasized.  Likelihood of success in alleviating the patient's condition was discussed. Routine postoperative instructions will be reviewed with the patient after procedure.  The patient concurred with the proposed plan, giving informed written consent for the surgery.  Patient has been NPO since  last night and she will remain NPO for procedure.  Anesthesia and OR aware.  To OR when ready.   Verita Schneiders, MD, [facility] [employer], [place]

## 2021-09-14 NOTE — Anesthesia Preprocedure Evaluation (Addendum)
Anesthesia Evaluation  Patient identified by MRN, date of birth, ID band Patient awake    Reviewed: Allergy & Precautions, NPO status , Patient's Chart, lab work & pertinent test results  Airway Mallampati: II  TM Distance: >3 FB Neck ROM: Full    Dental no notable dental hx.    Pulmonary neg pulmonary ROS, former smoker,    Pulmonary exam normal        Cardiovascular negative cardio ROS   Rhythm:Regular Rate:Normal     Neuro/Psych Anxiety negative neurological ROS     GI/Hepatic Neg liver ROS, GERD  ,  Endo/Other  negative endocrine ROS  Renal/GU negative Renal ROS  Female GU complaint     Musculoskeletal negative musculoskeletal ROS (+)   Abdominal Normal abdominal exam  (+)   Peds  Hematology  (+) Blood dyscrasia, anemia ,   Anesthesia Other Findings   Reproductive/Obstetrics                            Anesthesia Physical Anesthesia Plan  ASA: 2  Anesthesia Plan: General   Post-op Pain Management: Celebrex PO (pre-op)*, Tylenol PO (pre-op)* and Gabapentin PO (pre-op)*   Induction: Intravenous  PONV Risk Score and Plan: 3 and Ondansetron, Dexamethasone, Midazolam and Treatment may vary due to age or medical condition  Airway Management Planned: Mask and LMA  Additional Equipment: None  Intra-op Plan:   Post-operative Plan: Extubation in OR  Informed Consent: I have reviewed the patients History and Physical, chart, labs and discussed the procedure including the risks, benefits and alternatives for the proposed anesthesia with the patient or authorized representative who has indicated his/her understanding and acceptance.     Dental advisory given  Plan Discussed with: CRNA  Anesthesia Plan Comments:         Anesthesia Quick Evaluation

## 2021-09-14 NOTE — Anesthesia Procedure Notes (Signed)
Procedure Name: LMA Insertion Date/Time: 09/14/2021 11:45 AM  Performed by: Francie Massing, CRNAPre-anesthesia Checklist: Patient identified, Emergency Drugs available, Suction available and Patient being monitored Patient Re-evaluated:Patient Re-evaluated prior to induction Oxygen Delivery Method: Circle system utilized Preoxygenation: Pre-oxygenation with 100% oxygen Induction Type: IV induction Ventilation: Mask ventilation without difficulty LMA: LMA inserted LMA Size: 4.0 Number of attempts: 1 Airway Equipment and Method: Bite block Placement Confirmation: positive ETCO2 Tube secured with: Tape Dental Injury: Teeth and Oropharynx as per pre-operative assessment

## 2021-09-14 NOTE — Transfer of Care (Signed)
Immediate Anesthesia Transfer of Care Note  Patient: Carmen Johnston  Procedure(s) Performed: Procedure(s) (LRB): LOOP ELECTROSURGICAL EXCISION PROCEDURE (LEEP) (N/A)  Patient Location: PACU  Anesthesia Type: General  Level of Consciousness: awake, sedated, patient cooperative and responds to stimulation  Airway & Oxygen Therapy: Patient Spontanous Breathing and Patient connected to [place] 02   Post-op Assessment: Report given to PACU RN, Post -op Vital signs reviewed and stable and Patient moving all extremities  Post vital signs: Reviewed and stable  Complications: No apparent anesthesia complications

## 2021-09-15 ENCOUNTER — Encounter (HOSPITAL_BASED_OUTPATIENT_CLINIC_OR_DEPARTMENT_OTHER): Payer: Self-pay | Admitting: Obstetrics & Gynecology

## 2021-09-15 NOTE — Anesthesia Postprocedure Evaluation (Signed)
Anesthesia Post Note  Patient: Carmen Johnston  Procedure(s) Performed: LOOP ELECTROSURGICAL EXCISION PROCEDURE (LEEP) (Cervix)     Patient location during evaluation: PACU Anesthesia Type: General Level of consciousness: awake and alert Pain management: pain level controlled Vital Signs Assessment: post-procedure vital signs reviewed and stable Respiratory status: spontaneous breathing, nonlabored ventilation, respiratory function stable and patient connected to nasal cannula oxygen Cardiovascular status: blood pressure returned to baseline and stable Postop Assessment: no apparent nausea or vomiting Anesthetic complications: no   No notable events documented.  Last Vitals:  Vitals:   09/14/21 1300 09/14/21 1325  BP: 131/83 (!) 129/97  Pulse: (!) 58 63  Resp: 13 14  Temp:  36.7 C  SpO2: 97% 100%    Last Pain:  Vitals:   09/14/21 1325  PainSc: 0-No pain                 Earl Lites P [place]

## 2021-09-21 ENCOUNTER — Encounter: Payer: Self-pay | Admitting: Obstetrics & Gynecology

## 2021-09-21 LAB — SURGICAL PATHOLOGY

## 2021-10-03 ENCOUNTER — Emergency Department (HOSPITAL_COMMUNITY): Payer: Medicaid Other

## 2021-10-03 ENCOUNTER — Other Ambulatory Visit: Payer: Self-pay

## 2021-10-03 ENCOUNTER — Emergency Department (HOSPITAL_COMMUNITY)
Admission: EM | Admit: 2021-10-03 | Discharge: 2021-10-03 | Discharge status: Against Medical Advice | Payer: Medicaid Other | Attending: Emergency Medicine | Admitting: Emergency Medicine

## 2021-10-03 ENCOUNTER — Encounter (HOSPITAL_COMMUNITY): Payer: Self-pay

## 2021-10-03 DIAGNOSIS — R079 Chest pain, unspecified: Secondary | ICD-10-CM | POA: Diagnosis not present

## 2021-10-03 DIAGNOSIS — R0789 Other chest pain: Secondary | ICD-10-CM | POA: Diagnosis not present

## 2021-10-03 DIAGNOSIS — Z5321 Procedure and treatment not carried out due to patient leaving prior to being seen by health care provider: Secondary | ICD-10-CM | POA: Diagnosis not present

## 2021-10-03 LAB — CBC
HCT: 35.7 % — ABNORMAL LOW (ref 36.0–46.0)
Hemoglobin: 11 g/dL — ABNORMAL LOW (ref 12.0–15.0)
MCH: 25.6 pg — ABNORMAL LOW (ref 26.0–34.0)
MCHC: 30.8 g/dL (ref 30.0–36.0)
MCV: 83.2 fL (ref 80.0–100.0)
Platelets: 334 10*3/uL (ref 150–400)
RBC: 4.29 MIL/uL (ref 3.87–5.11)
RDW: 14.8 % (ref 11.5–15.5)
WBC: 4.8 10*3/uL (ref 4.0–10.5)
nRBC: 0 % (ref 0.0–0.2)

## 2021-10-03 LAB — BASIC METABOLIC PANEL
Anion gap: 8 (ref 5–15)
BUN: 6 mg/dL (ref 6–20)
CO2: 29 mmol/L (ref 22–32)
Calcium: 9.9 mg/dL (ref 8.9–10.3)
Chloride: 102 mmol/L (ref 98–111)
Creatinine, Ser: 0.88 mg/dL (ref 0.44–1.00)
GFR, Estimated: >60 mL/min (ref >60)
Glucose, Bld: 84 mg/dL (ref 70–99)
Potassium: 4.4 mmol/L (ref 3.5–5.1)
Sodium: 139 mmol/L (ref 135–145)

## 2021-10-03 LAB — LIPASE, BLOOD: Lipase: 43 U/L (ref 11–51)

## 2021-10-03 LAB — I-STAT BETA HCG BLOOD, ED (MC, WL, AP ONLY): I-stat hCG, quantitative: <5 m[IU]/mL (ref <5)

## 2021-10-03 LAB — TROPONIN I (HIGH SENSITIVITY): Troponin I (High Sensitivity): <2 ng/L (ref <18)

## 2021-10-03 NOTE — ED Provider Triage Note (Signed)
Emergency Medicine Provider Triage Evaluation Note  Carmen Johnston , a 41 y.o. female  was evaluated in triage.  Pt complains of chest pain, left thoracic back pain.  Patient states the symptoms began yesterday with a left-sided thoracic back pain.  She noted eating after the incident which then caused left-sided chest pain and regurgitant type symptoms.  She states that pain is worse at night and after awakening but eases during the day.  She notes also eating exacerbates pain.  Denies history of cardiac or pulmonary issues.  Denies fever, chills, night sweats, shortness of breath, abdominal pain..  Review of Systems  Positive: See above Negative:   Physical Exam  BP (!) 141/107 (BP Location: Right Arm)   Pulse 82   Temp 98.5 F (36.9 C) (Oral)   Resp 18   Ht 5\' 7"  (1.702 m)   Wt 89.4 kg   LMP 08/17/2021 (Exact Date)   SpO2 100%   BMI 30.85 kg/m  Gen:   Awake, no distress   Resp:  Normal effort  MSK:   Moves extremities without difficulty  Other:  No abdominal tenderness on exam  Medical Decision Making  Medically screening exam initiated at 2:17 PM.  Appropriate orders placed.  FLONNIE WIERMAN was informed that the remainder of the evaluation will be completed by another provider, this initial triage assessment does not replace that evaluation, and the importance of remaining in the ED until their evaluation is complete.     Wilnette Kales, [place] 10/03/21 1425

## 2021-10-03 NOTE — ED Triage Notes (Signed)
Reports left sided pain and heartburn indigestion.  Reports also chest pain and pain with movement. Reports when she eats she keeps regurgitating.  Pain is worse with m ovement.

## 2021-10-03 NOTE — ED Notes (Signed)
Pt notified registration staff that she is leaving. 

## 2021-10-18 ENCOUNTER — Ambulatory Visit: Payer: Medicaid Other | Admitting: Obstetrics & Gynecology

## 2021-10-21 ENCOUNTER — Telehealth: Payer: Self-pay

## 2021-10-21 NOTE — Telephone Encounter (Signed)
Second attempt to reach pt this morning regarding triage fax of vaginal odor first call was disconnected .  Mail box is full pt did not answer Pt needs to R/S missed appt to F/U on LEEP as well.

## 2021-11-01 ENCOUNTER — Ambulatory Visit: Payer: Medicaid Other | Admitting: Obstetrics & Gynecology

## 2021-11-08 ENCOUNTER — Ambulatory Visit (HOSPITAL_COMMUNITY)
Admission: EM | Admit: 2021-11-08 | Discharge: 2021-11-08 | Disposition: A | Discharge status: Home / Self Care | Payer: Medicaid Other | Attending: Internal Medicine | Admitting: Internal Medicine

## 2021-11-08 ENCOUNTER — Encounter (HOSPITAL_COMMUNITY): Payer: Self-pay

## 2021-11-08 VITALS — BP 133/93 | HR 95 | Temp 98.1°F | Resp 16

## 2021-11-08 DIAGNOSIS — N3000 Acute cystitis without hematuria: Secondary | ICD-10-CM | POA: Diagnosis not present

## 2021-11-08 DIAGNOSIS — B86 Scabies: Secondary | ICD-10-CM | POA: Insufficient documentation

## 2021-11-08 LAB — POCT URINALYSIS DIPSTICK, ED / UC
Bilirubin Urine: NEGATIVE
Glucose, UA: 100 mg/dL — AB
Leukocytes,Ua: NEGATIVE
Nitrite: POSITIVE — AB
Protein, ur: 100 mg/dL — AB
Specific Gravity, Urine: 1.025 (ref 1.005–1.030)
Urobilinogen, UA: 2 mg/dL — ABNORMAL HIGH (ref 0.0–1.0)
pH: 5 (ref 5.0–8.0)

## 2021-11-08 MED ORDER — PERMETHRIN 5 % EX CREA: TOPICAL_CREAM | CUTANEOUS | 1 refills | Status: DC

## 2021-11-08 MED ORDER — CEPHALEXIN 500 MG PO CAPS: 500.0000 mg | ORAL_CAPSULE | Freq: Two times a day (BID) | ORAL | 0 refills | Status: AC

## 2021-11-08 NOTE — Discharge Instructions (Addendum)
You have been diagnosed with urinary tract infection today.  I have prescribed Keflex for you to take 500 mg twice daily for the next 7 days.  If you develop diarrhea while taking this medication you may purchase an over-the-counter probiotic or eat yogurt with live active cultures.  To avoid GI upset please take this medication with food. I have sent your urine for culture to see what type of bacteria grows. We will call you if we need to change the treatment plan based on the results of your urine culture.  Apply permethrin cream to your affected areas where you believe that you may still have scabies infection once at bedtime, then rinse off in the morning with the shower.  Wash all clothing and bedding in hot water.  Return to urgent care if symptoms fail to improve or persist.  If you develop any new or worsening symptoms or do not improve in the next 2 to 3 days, please return.  If your symptoms are severe, please go to the emergency room.  Follow-up with your primary care provider for further evaluation and management of your symptoms as well as ongoing wellness visits.  I hope you feel better!

## 2021-11-08 NOTE — ED Triage Notes (Addendum)
Patient states the last time she was seen she was diagnosed with scabies. Does not think it went away completely. Still having swabs.   Also having urinary symptoms. When going having urgency, and pressure. Discomfort when finished going. Patient requesting a CYTO swab be done along with urine testing.

## 2021-11-08 NOTE — ED Provider Notes (Signed)
[facility]    CSN: [id number] Arrival date & time: 11/08/21  4801      History   Chief Complaint Chief Complaint  Patient presents with   Urinary Tract Infection   Scabies     HPI Carmen Johnston is a 41 y.o. female.   Patient presents urgent care for reevaluation of possible scabies infection as she continues to see lesions/webs to the right elbow and bilateral lower extremities.  She was treated for scabies in August 2023 and states that her rash/lesions improved fully but came back a few days ago.  She wonders if she fully eradicated this scabies after washing bedding and is requesting further treatment for this today.  She states that the lesions come and go and are very itchy.  She also complains of abdominal suprapubic pressure that has been present for the last 2 days along with urinary frequency, dysuria, and urinary urgency.  Denies frequent intake of urinary irritants, nausea, vomiting, back pain, fever/chills, constipation, diarrhea, and recent antibiotic/steroid use.  She does not suffer from frequent urinary tract infections.  She would also like to be evaluated for vaginal odor that she believes to be related to possible bacterial vaginosis.  She had a procedure to burn lesions on the cervix related to possible cervical cancer about a month ago and was instructed to not insert anything into the vaginal canal for that period of time.  She followed these instructions, but states that her vaginal area has "not felt right" since the procedure.  She denies vaginal discharge, itching, and rash.  No concern for STI.   Urinary Tract Infection   Past Medical History:  Diagnosis Date   Anemia    Anxiety    CIN II (cervical intraepithelial neoplasia II)    GERD (gastroesophageal reflux disease)    History of chlamydia    History of COVID-19 2022   per pt mild symptoms that resolved   History of scabies    Urgent care  visit 08-16-2021  in epic ---  dx scabies ,   treated with prescription cream    (08-31-2021  pt denies any skin rash)   History of trichomonal vaginitis    hx recurrent ,  last episode 03-11-2021   Hx of abnormal cervical Pap smear 12/23/2020   ASC-H/AGC/HPV pos. Had colposcopy in office on 03/15/21, Hysteroscopy, D&C on 03/16/21    Patient Active Problem List   Diagnosis Date Noted   Dysplasia of cervix, high grade CIN 2 on 03/05/2021 colposcopy biopsy 03/21/2021   Atypical cervical glandular cells on 12/23/2020 pap smear 03/14/2021   Atypical squamous cells cannot exclude high grade squamous intraepithelial lesion (ASC-H) on 12/23/2020 pap smear 03/14/2021   Cervical high risk HPV (human papillomavirus) test positive on 12/23/2020 pap smear 03/14/2021    Past Surgical History:  Procedure Laterality Date   HYSTEROSCOPY WITH D & C N/A 03/16/2021   Procedure: DILATATION AND CURETTAGE /HYSTEROSCOPY;  Surgeon: Tereso Newcomer, MD;  Location: [facility];  Service: Gynecology;  Laterality: N/A;   LEEP N/A 09/14/2021   Procedure: LOOP ELECTROSURGICAL EXCISION PROCEDURE (LEEP);  Surgeon: Tereso Newcomer, MD;  Location: [facility];  Service: Gynecology;  Laterality: N/A;   WISDOM TOOTH EXTRACTION     teen    OB History     Gravida  8   Para  4   Term  4   Preterm  0   AB  3   Living  4  SAB  1   IAB  1   Ectopic  1   Multiple      Live Births  4            Home Medications    Prior to Admission medications   Medication Sig Start Date End Date Taking? Authorizing Provider  cephALEXin (KEFLEX) 500 MG capsule Take 1 capsule (500 mg total) by mouth 2 (two) times daily for 7 days. 11/08/21 11/15/21 Yes Lilliana Turner, Donavan Burnet, FNP  calcium carbonate (TUMS - DOSED IN MG ELEMENTAL CALCIUM) 500 MG chewable tablet Chew 1 tablet by mouth as needed for indigestion or heartburn.    [provider]  Cholecalciferol (VITAMIN D-3 PO) Take 1 tablet by mouth daily.    [provider]  ibuprofen (ADVIL) 600 MG tablet Take 1 tablet (600 mg total) by mouth every 6 (six) hours as needed for headache, mild pain, moderate pain or cramping. 09/14/21   Anyanwu, Jethro Bastos, MD  Multiple Vitamins-Minerals (CENTRUM ADULTS PO) Take 1 tablet by mouth daily.    [provider]  permethrin (ELIMITE) 5 % cream Apply to affected area once; you can repeat in 2 weeks if need 11/08/21   Carlisle Beers, FNP  fluticasone (FLONASE) 50 MCG/ACT nasal spray Place 2 sprays into both nostrils daily. 01/16/20 04/15/20  Lurene Shadow, PA-C  omeprazole (PRILOSEC) 40 MG capsule Take 1 capsule (40 mg total) by mouth daily. 04/16/18 11/26/18  Sherrilyn Rist, MD    Family History Family History  Problem Relation Age of Onset   Mental illness Mother    Anesthesia problems Neg Hx    Stomach cancer Neg Hx    Colon cancer Neg Hx    Pancreatic cancer Neg Hx     Social History Social History   Tobacco Use   Smoking status: Former    Packs/day: 0.25    Years: 10.00    Total pack years: 2.50    Types: Cigarettes    Quit date: 01/04/2017    Years since quitting: 4.8   Smokeless tobacco: Never  Vaping Use   Vaping Use: Never used  Substance Use Topics   Alcohol use: Yes    Comment: occasionally   Drug use: Yes    Types: Marijuana     Allergies   Patient has no known allergies.   Review of Systems Review of Systems Per HPI  Physical Exam Triage Vital Signs ED Triage Vitals  Enc Vitals Group     BP 11/08/21 0925 (!) 133/93     Pulse Rate 11/08/21 0925 95     Resp 11/08/21 0925 16     Temp 11/08/21 0925 98.1 F (36.7 C)     Temp src --      SpO2 11/08/21 0925 (!) 75 %     Weight --      Height --      Head Circumference --      Peak Flow --      Pain Score 11/08/21 0923 7     Pain Loc --      Pain Edu? --      Excl. in GC? --    No data found.  Updated Vital Signs BP (!) 133/93 (BP Location: Right Arm)   Pulse 95   Temp 98.1 F (36.7 C)    Resp 16   LMP 10/31/2021 (Approximate)   SpO2 (!) 75%   Visual Acuity Right Eye Distance:   Left Eye Distance:  Bilateral Distance:    Right Eye Near:   Left Eye Near:    Bilateral Near:     Physical Exam Vitals and nursing note reviewed.  Constitutional:      Appearance: She is not ill-appearing or toxic-appearing.  HENT:     Head: Normocephalic and atraumatic.     Right Ear: Hearing and external ear normal.     Left Ear: Hearing and external ear normal.     Nose: Nose normal.     Mouth/Throat:     Lips: Pink.  Eyes:     General: Lids are normal. Vision grossly intact. Gaze aligned appropriately.     Extraocular Movements: Extraocular movements intact.     Conjunctiva/sclera: Conjunctivae normal.  Pulmonary:     Effort: Pulmonary effort is normal.  Abdominal:     Tenderness: There is no abdominal tenderness. There is no right CVA tenderness or left CVA tenderness.  Musculoskeletal:     Cervical back: Neck supple.  Skin:    General: Skin is warm and dry.     Capillary Refill: Capillary refill takes less than 2 seconds.     Findings: Rash present.     Comments: Faint evidence of linear lesions present to the right elbow, patient reports subjective pruritus at this area. Small areas of linear lesions to the bilateral ankles. No erythema, soft tissue swelling, or drainage.  Neurological:     General: No focal deficit present.     Mental Status: She is alert and oriented to person, place, and time. Mental status is at baseline.     Cranial Nerves: No dysarthria or facial asymmetry.  Psychiatric:        Mood and Affect: Mood normal.        Speech: Speech normal.        Behavior: Behavior normal.        Thought Content: Thought content normal.        Judgment: Judgment normal.      UC Treatments / Results  Labs (all labs ordered are listed, but only abnormal results are displayed) Labs Reviewed  POCT URINALYSIS DIPSTICK, ED / UC - Abnormal; Notable for the  following components:      Result Value   Glucose, UA 100 (*)    Ketones, ur TRACE (*)    Hgb urine dipstick TRACE (*)    Protein, ur 100 (*)    Urobilinogen, UA 2.0 (*)    Nitrite POSITIVE (*)    All other components within normal limits  URINE CULTURE  CERVICOVAGINAL ANCILLARY ONLY    EKG   Radiology No results found.  Procedures Procedures (including critical care time)  Medications Ordered in UC Medications - No data to display  Initial Impression / Assessment and Plan / UC Course  I have reviewed the triage vital signs and the nursing notes.  Pertinent labs & imaging results that were available during my care of the patient were reviewed by me and considered in my medical decision making (see chart for details).   1.  Acute cystitis without hematuria Urinalysis is positive for nitrites and consistent with acute uncomplicated cystitis.  Plan to treat this with Keflex twice daily for the next 7 days.  Urine culture is pending.  Advised patient to reduce intake of urinary irritants.  She may continue using Pyridium for the next couple of days as needed for dysuria.  Otherwise, she may use Tylenol as needed for pain.  Advised to increase water intake to at  least 64 ounces of water per day to stay well-hydrated.   2.  Scabies Patient will like to be retreated with permethrin cream 1 more time as she believes that the initial treatment did not fully work since she is still having some symptoms.  She may apply the permethrin once more to the affected areas and leave this on overnight, then shower in the morning.  Patient to follow-up with primary care provider regarding further symptoms.  Discussed physical exam and available lab work findings in clinic with patient.  Counseled patient regarding appropriate use of medications and potential side effects for all medications recommended or prescribed today. Discussed red flag signs and symptoms of worsening condition,when to call the  PCP office, return to urgent care, and when to seek higher level of care in the emergency department. Patient verbalizes understanding and agreement with plan. All questions answered. Patient discharged in stable condition.    Final Clinical Impressions(s) / UC Diagnoses   Final diagnoses:  Acute cystitis without hematuria  Scabies     Discharge Instructions      You have been diagnosed with urinary tract infection today.  I have prescribed Keflex for you to take 500 mg twice daily for the next 7 days.  If you develop diarrhea while taking this medication you may purchase an over-the-counter probiotic or eat yogurt with live active cultures.  To avoid GI upset please take this medication with food. I have sent your urine for culture to see what type of bacteria grows. We will call you if we need to change the treatment plan based on the results of your urine culture.  Apply permethrin cream to your affected areas where you believe that you may still have scabies infection once at bedtime, then rinse off in the morning with the shower.  Wash all clothing and bedding in hot water.  Return to urgent care if symptoms fail to improve or persist.  If you develop any new or worsening symptoms or do not improve in the next 2 to 3 days, please return.  If your symptoms are severe, please go to the emergency room.  Follow-up with your primary care provider for further evaluation and management of your symptoms as well as ongoing wellness visits.  I hope you feel better!   ED Prescriptions     Medication Sig Dispense Auth. Provider   cephALEXin (KEFLEX) 500 MG capsule Take 1 capsule (500 mg total) by mouth 2 (two) times daily for 7 days. 14 capsule Reita May M, FNP   permethrin (ELIMITE) 5 % cream Apply to affected area once; you can repeat in 2 weeks if need 60 g Carlisle Beers, FNP      PDMP not reviewed this encounter.   Carlisle Beers, [place] 11/08/21 1837

## 2021-11-10 LAB — CERVICOVAGINAL ANCILLARY ONLY
Bacterial Vaginitis (gardnerella): POSITIVE — AB
Candida Glabrata: NEGATIVE
Candida Vaginitis: NEGATIVE
Chlamydia: NEGATIVE
Comment: NEGATIVE
Comment: NEGATIVE
Comment: NEGATIVE
Comment: NEGATIVE
Comment: NEGATIVE
Comment: NORMAL
Neisseria Gonorrhea: NEGATIVE
Trichomonas: NEGATIVE

## 2021-11-10 LAB — URINE CULTURE: Culture: >=100000 — AB

## 2021-11-11 ENCOUNTER — Telehealth (HOSPITAL_COMMUNITY): Payer: Self-pay

## 2021-11-11 MED ORDER — METRONIDAZOLE 500 MG PO TABS: 500.0000 mg | ORAL_TABLET | Freq: Two times a day (BID) | ORAL | 0 refills | Status: DC

## 2021-12-07 ENCOUNTER — Ambulatory Visit (HOSPITAL_COMMUNITY)
Admission: EM | Admit: 2021-12-07 | Discharge: 2021-12-07 | Disposition: A | Discharge status: Home / Self Care | Payer: Medicaid Other | Attending: Physician Assistant | Admitting: Physician Assistant

## 2021-12-07 ENCOUNTER — Encounter (HOSPITAL_COMMUNITY): Payer: Self-pay

## 2021-12-07 DIAGNOSIS — N898 Other specified noninflammatory disorders of vagina: Secondary | ICD-10-CM | POA: Diagnosis not present

## 2021-12-07 DIAGNOSIS — R350 Frequency of micturition: Secondary | ICD-10-CM | POA: Insufficient documentation

## 2021-12-07 DIAGNOSIS — O2341 Unspecified infection of urinary tract in pregnancy, first trimester: Secondary | ICD-10-CM | POA: Diagnosis not present

## 2021-12-07 DIAGNOSIS — Z3A01 Less than 8 weeks gestation of pregnancy: Secondary | ICD-10-CM | POA: Insufficient documentation

## 2021-12-07 LAB — POCT URINALYSIS DIPSTICK, ED / UC
Bilirubin Urine: NEGATIVE
Glucose, UA: NEGATIVE mg/dL
Ketones, ur: NEGATIVE mg/dL
Nitrite: NEGATIVE
Protein, ur: NEGATIVE mg/dL
Specific Gravity, Urine: 1.02 (ref 1.005–1.030)
Urobilinogen, UA: 0.2 mg/dL (ref 0.0–1.0)
pH: 7 (ref 5.0–8.0)

## 2021-12-07 MED ORDER — CEPHALEXIN 500 MG PO CAPS: 500.0000 mg | ORAL_CAPSULE | Freq: Four times a day (QID) | ORAL | 0 refills | Status: AC

## 2021-12-07 NOTE — ED Triage Notes (Signed)
Pt states she had BV and was unable to complete the antibiotics  states she feels like it hasn't cleared up. Denies any discharge. States she is [redacted] weeks pregnant.

## 2021-12-07 NOTE — Discharge Instructions (Signed)
We are treating you for urinary tract infection.  We will contact you if we need to arrange additional treatment based on your culture results.  Will also contact you if we need to arrange additional treatment based on your swab.  Make sure you rest and drink plenty of fluid.  If you have any changing or worsening symptoms you need to be seen immediately.

## 2021-12-07 NOTE — ED Provider Notes (Signed)
[facility]    CSN: [id number] Arrival date & time: 12/07/21  1456      History   Chief Complaint Chief Complaint  Patient presents with   Vaginal Discharge    HPI Carmen Johnston is a 41 y.o. female.   Patient presents today with a prolonged history of vaginal discharge and irritation.  She was seen 11/08/2021 at which point she tested positive for bacterial vaginosis.  She was prescribed metronidazole but was unable to complete course of medication as it caused widespread itching and rash.  She denies any vaginal odor.  She has tried over-the-counter yeast medication with minimal improvement of symptoms.  Denies any changes to personal hygiene products including soaps or detergents.  She is pregnant (approximately [redacted] weeks gestation).  She denies any abdominal pain, pelvic pain, fever, nausea, vomiting.  She denies any urinary symptoms other than urinary frequency and is unsure if this is related to UTI or pregnancy.    Past Medical History:  Diagnosis Date   Anemia    Anxiety    CIN II (cervical intraepithelial neoplasia II)    GERD (gastroesophageal reflux disease)    History of chlamydia    History of COVID-19 2022   per pt mild symptoms that resolved   History of scabies    Urgent care  visit 08-16-2021  in epic ---  dx scabies ,  treated with prescription cream    (08-31-2021  pt denies any skin rash)   History of trichomonal vaginitis    hx recurrent ,  last episode 03-11-2021   Hx of abnormal cervical Pap smear 12/23/2020   ASC-H/AGC/HPV pos. Had colposcopy in office on 03/15/21, Hysteroscopy, D&C on 03/16/21    Patient Active Problem List   Diagnosis Date Noted   Dysplasia of cervix, high grade CIN 2 on 03/05/2021 colposcopy biopsy 03/21/2021   Atypical cervical glandular cells on 12/23/2020 pap smear 03/14/2021   Atypical squamous cells cannot exclude high grade squamous intraepithelial lesion (ASC-H) on 12/23/2020 pap smear 03/14/2021   Cervical high risk  HPV (human papillomavirus) test positive on 12/23/2020 pap smear 03/14/2021    Past Surgical History:  Procedure Laterality Date   HYSTEROSCOPY WITH D & C N/A 03/16/2021   Procedure: DILATATION AND CURETTAGE /HYSTEROSCOPY;  Surgeon: Tereso Newcomer, MD;  Location: [facility];  Service: Gynecology;  Laterality: N/A;   LEEP N/A 09/14/2021   Procedure: LOOP ELECTROSURGICAL EXCISION PROCEDURE (LEEP);  Surgeon: Tereso Newcomer, MD;  Location: [facility];  Service: Gynecology;  Laterality: N/A;   WISDOM TOOTH EXTRACTION     teen    OB History     Gravida  9   Para  4   Term  4   Preterm  0   AB  3   Living  4      SAB  1   IAB  1   Ectopic  1   Multiple      Live Births  4            Home Medications    Prior to Admission medications   Medication Sig Start Date End Date Taking? Authorizing Provider  cephALEXin (KEFLEX) 500 MG capsule Take 1 capsule (500 mg total) by mouth 4 (four) times daily for 5 days. 12/07/21 12/12/21 Yes Madilyn Cephas K, PA-C  calcium carbonate (TUMS - DOSED IN MG ELEMENTAL CALCIUM) 500 MG chewable tablet Chew 1 tablet by mouth as needed for indigestion or heartburn.  [provider]  Cholecalciferol (VITAMIN D-3 PO) Take 1 tablet by mouth daily.    [provider]  ibuprofen (ADVIL) 600 MG tablet Take 1 tablet (600 mg total) by mouth every 6 (six) hours as needed for headache, mild pain, moderate pain or cramping. 09/14/21   Anyanwu, Jethro BastosUgonna A, MD  metroNIDAZOLE (FLAGYL) 500 MG tablet Take 1 tablet (500 mg total) by mouth 2 (two) times daily. 11/11/21   LampteyBritta Mccreedy, Philip O, MD  Multiple Vitamins-Minerals (CENTRUM ADULTS PO) Take 1 tablet by mouth daily.    [provider]  permethrin (ELIMITE) 5 % cream Apply to affected area once; you can repeat in 2 weeks if need 11/08/21   Carlisle BeersStanhope, Catharine M, FNP  fluticasone (FLONASE) 50 MCG/ACT nasal spray Place 2 sprays into both nostrils daily.  01/16/20 04/15/20  Lurene ShadowPhelps, Mylee Falin O, PA-C  omeprazole (PRILOSEC) 40 MG capsule Take 1 capsule (40 mg total) by mouth daily. 04/16/18 11/26/18  Sherrilyn Ristanis, Henry L III, MD    Family History Family History  Problem Relation Age of Onset   Mental illness Mother    Anesthesia problems Neg Hx    Stomach cancer Neg Hx    Colon cancer Neg Hx    Pancreatic cancer Neg Hx     Social History Social History   Tobacco Use   Smoking status: Former    Packs/day: 0.25    Years: 10.00    Total pack years: 2.50    Types: Cigarettes    Quit date: 01/04/2017    Years since quitting: 4.9   Smokeless tobacco: Never  Vaping Use   Vaping Use: Never used  Substance Use Topics   Alcohol use: Yes    Comment: occasionally   Drug use: Yes    Types: Marijuana     Allergies   Flagyl [metronidazole]   Review of Systems Review of Systems  Constitutional:  Negative for activity change, appetite change, fatigue and fever.  Gastrointestinal:  Negative for abdominal pain, diarrhea, nausea and vomiting.  Genitourinary:  Positive for frequency and vaginal discharge. Negative for dysuria, pelvic pain, urgency, vaginal bleeding and vaginal pain.     Physical Exam Triage Vital Signs ED Triage Vitals  Enc Vitals Group     BP 12/07/21 1753 114/82     Pulse Rate 12/07/21 1753 82     Resp 12/07/21 1753 16     Temp 12/07/21 1753 98.6 F (37 C)     Temp Source 12/07/21 1753 Oral     SpO2 12/07/21 1753 100 %     Weight --      Height --      Head Circumference --      Peak Flow --      Pain Score 12/07/21 1755 0     Pain Loc --      Pain Edu? --      Excl. in GC? --    No data found.  Updated Vital Signs BP 114/82 (BP Location: Left Arm)   Pulse 82   Temp 98.6 F (37 C) (Oral)   Resp 16   LMP 10/31/2021 (Approximate)   SpO2 100%   Visual Acuity Right Eye Distance:   Left Eye Distance:   Bilateral Distance:    Right Eye Near:   Left Eye Near:    Bilateral Near:     Physical Exam Vitals  reviewed.  Constitutional:      General: She is awake. She is not in acute distress.  Appearance: Normal appearance. She is well-developed. She is not ill-appearing.     Comments: Very pleasant female appears stated age no acute distress sitting comfortably in exam room  HENT:     Head: Normocephalic and atraumatic.  Cardiovascular:     Rate and Rhythm: Normal rate and regular rhythm.     Heart sounds: Normal heart sounds, S1 normal and S2 normal. No murmur heard. Pulmonary:     Effort: Pulmonary effort is normal.     Breath sounds: Normal breath sounds. No wheezing, rhonchi or rales.     Comments: Clear to auscultation bilaterally Abdominal:     General: Bowel sounds are normal.     Palpations: Abdomen is soft.     Tenderness: There is no abdominal tenderness. There is no right CVA tenderness, left CVA tenderness, guarding or rebound.     Comments: Benign abdominal exam  Psychiatric:        Behavior: Behavior is cooperative.      UC Treatments / Results  Labs (all labs ordered are listed, but only abnormal results are displayed) Labs Reviewed  POCT URINALYSIS DIPSTICK, ED / UC - Abnormal; Notable for the following components:      Result Value   Hgb urine dipstick TRACE (*)    Leukocytes,Ua TRACE (*)    All other components within normal limits  URINE CULTURE  CERVICOVAGINAL ANCILLARY ONLY    EKG   Radiology No results found.  Procedures Procedures (including critical care time)  Medications Ordered in UC Medications - No data to display  Initial Impression / Assessment and Plan / UC Course  I have reviewed the triage vital signs and the nursing notes.  Pertinent labs & imaging results that were available during my care of the patient were reviewed by me and considered in my medical decision making (see chart for details).     UA was obtained as patient reported urinary frequency and is [redacted] weeks gestation.  This showed hemoglobin and leukocyte esterase.   Offered to wait until culture results are available before starting antibiotics, however, upon further discussion patient reports increased frequency and irritation related to the symptoms and requested we initiate treatment.  Keflex was sent to pharmacy.  Urine culture was obtained and we discussed potential need to change antibiotics based on culture results.  STI swab was collected but will defer treatment for BV or yeast until results are available.  Recommended that she rest and drink plenty of fluid.  She has to follow-up with OB/GYN for further evaluation and management.  Discussed that if she has any worsening symptoms including abdominal pain, pelvic pain, fever, nausea, vomiting she needs to be seen immediately.  Strict return precautions given.  Work excuse note provided.  Final Clinical Impressions(s) / UC Diagnoses   Final diagnoses:  Urinary frequency  Vaginal irritation  [redacted] weeks gestation of pregnancy     Discharge Instructions      We are treating you for urinary tract infection.  We will contact you if we need to arrange additional treatment based on your culture results.  Will also contact you if we need to arrange additional treatment based on your swab.  Make sure you rest and drink plenty of fluid.  If you have any changing or worsening symptoms you need to be seen immediately.     ED Prescriptions     Medication Sig Dispense Auth. Provider   cephALEXin (KEFLEX) 500 MG capsule Take 1 capsule (500 mg total) by mouth 4 (  four) times daily for 5 days. 20 capsule Moxie Kalil, Noberto Retort, PA-C      PDMP not reviewed this encounter.   Jeani Hawking, PA-C 12/07/21 1844

## 2021-12-08 LAB — CERVICOVAGINAL ANCILLARY ONLY
Bacterial Vaginitis (gardnerella): NEGATIVE
Candida Glabrata: NEGATIVE
Candida Vaginitis: NEGATIVE
Chlamydia: NEGATIVE
Comment: NEGATIVE
Comment: NEGATIVE
Comment: NEGATIVE
Comment: NEGATIVE
Comment: NEGATIVE
Comment: NORMAL
Neisseria Gonorrhea: NEGATIVE
Trichomonas: NEGATIVE

## 2021-12-08 LAB — URINE CULTURE: Culture: NO GROWTH

## 2021-12-15 ENCOUNTER — Inpatient Hospital Stay (HOSPITAL_COMMUNITY): Payer: Medicaid Other

## 2021-12-15 ENCOUNTER — Other Ambulatory Visit: Payer: Self-pay | Admitting: Certified Nurse Midwife

## 2021-12-15 ENCOUNTER — Other Ambulatory Visit: Payer: Self-pay

## 2021-12-15 ENCOUNTER — Other Ambulatory Visit (HOSPITAL_COMMUNITY): Payer: Self-pay

## 2021-12-15 ENCOUNTER — Inpatient Hospital Stay (HOSPITAL_COMMUNITY)
Admission: AD | Admit: 2021-12-15 | Discharge: 2021-12-15 | Disposition: A | Discharge status: Home / Self Care | Payer: Medicaid Other | Attending: Obstetrics and Gynecology | Admitting: Obstetrics and Gynecology

## 2021-12-15 ENCOUNTER — Encounter (HOSPITAL_COMMUNITY): Payer: Self-pay | Admitting: Obstetrics and Gynecology

## 2021-12-15 DIAGNOSIS — R188 Other ascites: Secondary | ICD-10-CM | POA: Diagnosis not present

## 2021-12-15 DIAGNOSIS — O074 Failed attempted termination of pregnancy without complication: Secondary | ICD-10-CM | POA: Diagnosis not present

## 2021-12-15 DIAGNOSIS — O26851 Spotting complicating pregnancy, first trimester: Secondary | ICD-10-CM | POA: Diagnosis not present

## 2021-12-15 DIAGNOSIS — O23591 Infection of other part of genital tract in pregnancy, first trimester: Secondary | ICD-10-CM | POA: Insufficient documentation

## 2021-12-15 DIAGNOSIS — Z3A01 Less than 8 weeks gestation of pregnancy: Secondary | ICD-10-CM | POA: Diagnosis not present

## 2021-12-15 DIAGNOSIS — R102 Pelvic and perineal pain: Secondary | ICD-10-CM | POA: Diagnosis present

## 2021-12-15 DIAGNOSIS — R1111 Vomiting without nausea: Secondary | ICD-10-CM | POA: Diagnosis not present

## 2021-12-15 DIAGNOSIS — B9689 Other specified bacterial agents as the cause of diseases classified elsewhere: Secondary | ICD-10-CM | POA: Insufficient documentation

## 2021-12-15 DIAGNOSIS — N760 Acute vaginitis: Secondary | ICD-10-CM

## 2021-12-15 DIAGNOSIS — R58 Hemorrhage, not elsewhere classified: Secondary | ICD-10-CM | POA: Diagnosis not present

## 2021-12-15 DIAGNOSIS — R110 Nausea: Secondary | ICD-10-CM | POA: Diagnosis not present

## 2021-12-15 DIAGNOSIS — O26891 Other specified pregnancy related conditions, first trimester: Secondary | ICD-10-CM | POA: Diagnosis not present

## 2021-12-15 DIAGNOSIS — R112 Nausea with vomiting, unspecified: Secondary | ICD-10-CM | POA: Diagnosis not present

## 2021-12-15 LAB — COMPREHENSIVE METABOLIC PANEL
ALT: 20 U/L (ref 0–44)
AST: 21 U/L (ref 15–41)
Albumin: 3.5 g/dL (ref 3.5–5.0)
Alkaline Phosphatase: 40 U/L (ref 38–126)
Anion gap: 8 (ref 5–15)
BUN: <5 mg/dL — ABNORMAL LOW (ref 6–20)
CO2: 23 mmol/L (ref 22–32)
Calcium: 8.7 mg/dL — ABNORMAL LOW (ref 8.9–10.3)
Chloride: 104 mmol/L (ref 98–111)
Creatinine, Ser: 0.65 mg/dL (ref 0.44–1.00)
GFR, Estimated: >60 mL/min (ref >60)
Glucose, Bld: 92 mg/dL (ref 70–99)
Potassium: 4.4 mmol/L (ref 3.5–5.1)
Sodium: 135 mmol/L (ref 135–145)
Total Bilirubin: 0.8 mg/dL (ref 0.3–1.2)
Total Protein: 6.6 g/dL (ref 6.5–8.1)

## 2021-12-15 LAB — URINALYSIS, ROUTINE W REFLEX MICROSCOPIC
Bilirubin Urine: NEGATIVE
Glucose, UA: NEGATIVE mg/dL
Ketones, ur: 20 mg/dL — AB
Leukocytes,Ua: NEGATIVE
Nitrite: NEGATIVE
Protein, ur: NEGATIVE mg/dL
Specific Gravity, Urine: 1.008 (ref 1.005–1.030)
pH: 6 (ref 5.0–8.0)

## 2021-12-15 LAB — WET PREP, GENITAL
Clue Cells Wet Prep HPF POC: NONE SEEN
Sperm: NONE SEEN
Trich, Wet Prep: NONE SEEN
WBC, Wet Prep HPF POC: <10 (ref <10)
Yeast Wet Prep HPF POC: NONE SEEN

## 2021-12-15 LAB — ABO/RH: ABO/RH(D): B POS

## 2021-12-15 LAB — HCG, QUANTITATIVE, PREGNANCY: hCG, Beta Chain, Quant, S: 12038 m[IU]/mL — ABNORMAL HIGH (ref <5)

## 2021-12-15 MED ORDER — OXYCODONE HCL 5 MG PO CAPS: 5.0000 mg | ORAL_CAPSULE | ORAL | 0 refills | Status: DC | PRN

## 2021-12-15 MED ORDER — HYDROMORPHONE HCL 1 MG/ML IJ SOLN
1.0000 mg | Freq: Once | INTRAMUSCULAR | Status: AC
  Administered 2021-12-15: 1 mg via INTRAVENOUS
  Filled 2021-12-15: qty 1

## 2021-12-15 MED ORDER — ONDANSETRON HCL 4 MG PO TABS: 4.0000 mg | ORAL_TABLET | Freq: Three times a day (TID) | ORAL | 0 refills | Status: DC | PRN

## 2021-12-15 MED ORDER — OXYCODONE-ACETAMINOPHEN 5-325 MG PO TABS
1.0000 | ORAL_TABLET | Freq: Four times a day (QID) | ORAL | 0 refills | Status: DC | PRN
  Filled 2021-12-15: qty 5, 2d supply, fill #0

## 2021-12-15 NOTE — MAU Note (Signed)
.  Carmen Johnston is a 41 y.o. at Unknown here in MAU reporting: EMS arrival (went to lobby) Pt took abortion pill on Monday. Started having N/V today and some abd pain. Today. Called EMS because she could not handle th n/v. Given zofran and fentanyl on by EMS.  Pt in more pain now. Wanting pain medication. Reports just spotting. LMP: 10/24/21 Onset of complaint: this morning Pain score: 10  There were no vitals filed for this visit.   FHT:n/a  Lab orders placed from triage:

## 2021-12-15 NOTE — MAU Provider Note (Signed)
History     CSN: [id number]  Arrival date and time: 12/15/21 1058   Event Date/Time   First Provider Initiated Contact with Patient 12/15/21 1244      Chief Complaint  Patient presents with   Emesis   Nausea   Carmen Johnston , a  41 y.o. [id number] at [redacted]w[redacted]d presents to MAU via EMS with complaints of Nausea and vomiting and abdominal pain since yesterday. Patient reports that she is [redacted] weeks pregnant per [facility] and desired a medical abortion. Patient took 4 pills on Monday of this week. Patient reports that she started spotting later that evening but denies bright red heavy vaginal bleeding. She states that she had some "heavier bleeding" on Tuesday but denies being heavy like a period and denies passing clots or tissue. Patient states last night she was having 6/10 abdominal pain that was mildly relieved with PO Tylenol and ibuprofen, but this morning was awoken by pain and nausea and vomiting. She states she threw up repeat doses of pain meds. Patient describes pain as waxes ands wanes and occurring every 2-3 mins lasting 30-40 sec. She describes pain as "cramping." She denies urinary, or vaginal symptoms and also denies back pain.    Patient was given IV Zofran and of IV fentanyl on the truck. Patient reporting pain a 15/10.  Emesis  Associated symptoms include abdominal pain. Pertinent negatives include no chest pain, chills, diarrhea, dizziness, fever or headaches.    OB History     Gravida  9   Para  4   Term  4   Preterm  0   AB  3   Living  4      SAB  1   IAB  1   Ectopic  1   Multiple      Live Births  4           Past Medical History:  Diagnosis Date   Anemia    Anxiety    CIN II (cervical intraepithelial neoplasia II)    GERD (gastroesophageal reflux disease)    History of chlamydia    History of COVID-19 2022   per pt mild symptoms that resolved   History of scabies    Urgent care  visit 08-16-2021  in epic ---  dx scabies ,   treated with prescription cream    (08-31-2021  pt denies any skin rash)   History of trichomonal vaginitis    hx recurrent ,  last episode 03-11-2021   Hx of abnormal cervical Pap smear 12/23/2020   ASC-H/AGC/HPV pos. Had colposcopy in office on 03/15/21, Hysteroscopy, D&C on 03/16/21    Past Surgical History:  Procedure Laterality Date   HYSTEROSCOPY WITH D & C N/A 03/16/2021   Procedure: DILATATION AND CURETTAGE /HYSTEROSCOPY;  Surgeon: Tereso Newcomer, MD;  Location: [facility];  Service: Gynecology;  Laterality: N/A;   LEEP N/A 09/14/2021   Procedure: LOOP ELECTROSURGICAL EXCISION PROCEDURE (LEEP);  Surgeon: Tereso Newcomer, MD;  Location: [facility];  Service: Gynecology;  Laterality: N/A;   WISDOM TOOTH EXTRACTION     teen    Family History  Problem Relation Age of Onset   Mental illness Mother    Anesthesia problems Neg Hx    Stomach cancer Neg Hx    Colon cancer Neg Hx    Pancreatic cancer Neg Hx     Social History   Tobacco Use   Smoking status: Former  Packs/day: 0.25    Years: 10.00    Total pack years: 2.50    Types: Cigarettes    Quit date: 01/04/2017    Years since quitting: 4.9   Smokeless tobacco: Never  Vaping Use   Vaping Use: Never used  Substance Use Topics   Alcohol use: Yes    Comment: occasionally   Drug use: Not Currently    Types: Marijuana    Allergies:  Allergies  Allergen Reactions   Flagyl [Metronidazole] Rash    Medications Prior to Admission  Medication Sig Dispense Refill Last Dose   calcium carbonate (TUMS - DOSED IN MG ELEMENTAL CALCIUM) 500 MG chewable tablet Chew 1 tablet by mouth as needed for indigestion or heartburn.   Unknown   Cholecalciferol (VITAMIN D-3 PO) Take 1 tablet by mouth daily.   Unknown   ibuprofen (ADVIL) 600 MG tablet Take 1 tablet (600 mg total) by mouth every 6 (six) hours as needed for headache, mild pain, moderate pain or cramping. 30 tablet 2 Unknown   metroNIDAZOLE  (FLAGYL) 500 MG tablet Take 1 tablet (500 mg total) by mouth 2 (two) times daily. 14 tablet 0 Unknown   Multiple Vitamins-Minerals (CENTRUM ADULTS PO) Take 1 tablet by mouth daily.   Unknown   permethrin (ELIMITE) 5 % cream Apply to affected area once; you can repeat in 2 weeks if need 60 g 1 Unknown    Review of Systems  Constitutional:  Negative for chills, fatigue and fever.  Eyes:  Negative for pain and visual disturbance.  Respiratory:  Negative for apnea, shortness of breath and wheezing.   Cardiovascular:  Negative for chest pain and palpitations.  Gastrointestinal:  Positive for abdominal pain and vomiting. Negative for constipation, diarrhea and nausea.  Genitourinary:  Positive for vaginal bleeding. Negative for difficulty urinating, dysuria, pelvic pain, vaginal discharge and vaginal pain.  Musculoskeletal:  Negative for back pain.  Neurological:  Negative for dizziness, seizures, weakness, light-headedness and headaches.  Psychiatric/Behavioral:  Negative for suicidal ideas.    Physical Exam   Blood pressure (!) 140/89, pulse 72, temperature 98.1 F (36.7 C), temperature source Oral, resp. rate 20, height 5\' 7"  (1.702 m), weight 88.5 kg, last menstrual period 10/31/2021, SpO2 99 %.  Physical Exam Vitals and nursing note reviewed.  Constitutional:      General: She is not in acute distress.    Appearance: Normal appearance.  HENT:     Head: Normocephalic.  Pulmonary:     Effort: Pulmonary effort is normal.  Abdominal:     Palpations: Abdomen is soft.     Tenderness: There is abdominal tenderness. There is guarding. There is no right CVA tenderness or left CVA tenderness.  Musculoskeletal:        General: Normal range of motion.     Cervical back: Normal range of motion.  Skin:    General: Skin is warm and dry.     Capillary Refill: Capillary refill takes less than 2 seconds.  Neurological:     Mental Status: She is alert and oriented to person, place, and time.   Psychiatric:        Mood and Affect: Mood normal.     MAU Course  Procedures Orders Placed This Encounter  Procedures   Wet prep, genital   11/02/2021 OB LESS THAN 14 WEEKS WITH OB TRANSVAGINAL   Urinalysis, Routine w reflex microscopic   CBC with Differential/Platelet   Comprehensive metabolic panel   hCG, quantitative, pregnancy   Diet NPO time  specified   ABO/Rh   Discharge patient   Meds ordered this encounter  Medications   HYDROmorphone (DILAUDID) injection 1 mg   DISCONTD: oxycodone (OXY-IR) 5 MG capsule    Sig: Take 1 capsule (5 mg total) by mouth every 4 (four) hours as needed (breakthrough pain).    Dispense:  5 capsule    Refill:  0    Order Specific Question:   Supervising Provider    Answer:   Reva BoresRATT, TANYA S [[id number]]   ondansetron (ZOFRAN) 4 MG tablet    Sig: Take 1 tablet (4 mg total) by mouth every 8 (eight) hours as needed for nausea or vomiting.    Dispense:  20 tablet    Refill:  0    Order Specific Question:   Supervising Provider    Answer:   Reva BoresPRATT, TANYA S [[id number]]   oxyCODONE-acetaminophen (PERCOCET/ROXICET) 5-325 MG tablet    Sig: Take 1 tablet by mouth every 6 (six) hours as needed for severe pain.    Dispense:  5 tablet    Refill:  0    Order Specific Question:   Supervising Provider    Answer:   Samara SnideRATT, TANYA S [[id number]]   Results for orders placed or performed during the hospital encounter of 12/15/21 (from the past 24 hour(s))  hCG, quantitative, pregnancy     Status: Abnormal   Collection Time: 12/15/21 12:33 PM  Result Value Ref Range   hCG, Beta Chain, Quant, S 12,038 (H) <5 mIU/mL  Comprehensive metabolic panel     Status: Abnormal   Collection Time: 12/15/21 12:34 PM  Result Value Ref Range   Sodium 135 135 - 145 mmol/L   Potassium 4.4 3.5 - 5.1 mmol/L   Chloride 104 98 - 111 mmol/L   CO2 23 22 - 32 mmol/L   Glucose, Bld 92 70 - 99 mg/dL   BUN <5 (L) 6 - 20 mg/dL   Creatinine, Ser 4.090.65 0.44 - 1.00 mg/dL   Calcium 8.7 (L) 8.9 - 10.3 mg/dL    Total Protein 6.6 6.5 - 8.1 g/dL   Albumin 3.5 3.5 - 5.0 g/dL   AST 21 15 - 41 U/L   ALT 20 0 - 44 U/L   Alkaline Phosphatase 40 38 - 126 U/L   Total Bilirubin 0.8 0.3 - 1.2 mg/dL   GFR, Estimated >81>60 >19>60 mL/min   Anion gap 8 5 - 15  Wet prep, genital     Status: None   Collection Time: 12/15/21 12:39 PM   Specimen: Vaginal  Result Value Ref Range   Yeast Wet Prep HPF POC NONE SEEN NONE SEEN   Trich, Wet Prep NONE SEEN NONE SEEN   Clue Cells Wet Prep HPF POC NONE SEEN NONE SEEN   WBC, Wet Prep HPF POC <10 <10   Sperm NONE SEEN   ABO/Rh     Status: None   Collection Time: 12/15/21 12:54 PM  Result Value Ref Range   ABO/RH(D) B POS    No rh immune globuloin      NOT A RH IMMUNE GLOBULIN CANDIDATE, PT RH POSITIVE Performed at [facility], [address] [place], [place] [address]    US OB LESS THAN 14 WEEKS WITH OB TRANSVAGINAL  Result Date: 12/15/2021 CLINICAL DATA:  Abdominal and pelvic pain, increased today. Status post abortion pill 12/12/2021. Spotting. Reportedly around [redacted] weeks pregnant before pill. EXAM: OBSTETRIC <14 WK US AND TRANSVAGINAL OB US TECHNIQUE: Both transabdominal and transvaginal ultrasound examinations were  performed for complete evaluation of the gestation as well as the maternal uterus, adnexal regions, and pelvic cul-de-sac. Transvaginal technique was performed to assess early pregnancy. COMPARISON:  No RA Ob ultrasound available for the current pregnancy. Comparison is made with early OB ultrasound dated 03/15/2016 FINDINGS: Intrauterine gestational sac: Single Yolk sac:  Visualized. Embryo:  Not Visualized. Cardiac Activity: Not Visualized. Heart Rate: Not applicable MSD: 13.5 mm   6 w   2 d Subchorionic hemorrhage:  None visualized. Maternal uterus/ovaries: The right maternal ovary measures 3.6 x 2.5 x 2.6 cm and the left maternal ovary measures 3.9 x 3.3 x 3.0 cm. Normal color flow vascularity. No ectopic pregnancy is identified. Note is made of a large  complex fluid collection in the region of the cervical canal/lower uterine segment, presumably blood products. IMPRESSION: Note is made the patient underwent medical abortion approximately 3 days ago. No fetal pole is identified, however there is a yolk sac and gestational sac visualized at this time. Recommend continued clinical follow-up and possible ultrasound follow-up. Large complex fluid collection in the region of the cervical canal/lower uterine segment, presumably blood products. Electronically Signed   By: Neita Garnet M.D.   On: 12/15/2021 13:32    MDM Wet prep positive for BV CMP unremarkable  UA in process  Quant > 12,000 GC pending  - US reveal a IUP with possible products of conception in the cervix.  - Patient began bleeding upon arrival from [place].  - Pain now "tolerable" with IV Dilaudid.  - Low suspicion for ectopic pregnancy.  - Plan for discharge.   Assessment and Plan   1. Incomplete induced termination of pregnancy   2. [redacted] weeks gestation of pregnancy   3. BV (bacterial vaginosis)    - Discussed that at this time this is a termination in progress.  - Reviewed worsening signs and return precautions with patient and significant other.  - Rx for PO Phenergan and Oxycodone for Pain sent to outpatient pharmacy.  - Strict return bleeding precautions discussed.  - Also reviewed results for BV. Discussed Boric Acid capsules following termination for treatment d/t Metronidazole allergy. Patient verbalized understanding.  - Patient discharged home in stable condition and may return to MAU as needed.   Claudette Head, MSN CNM  12/15/2021, 12:44 PM

## 2021-12-16 LAB — GC/CHLAMYDIA PROBE AMP (~~LOC~~) NOT AT ARMC
Chlamydia: NEGATIVE
Comment: NEGATIVE
Comment: NORMAL
Neisseria Gonorrhea: NEGATIVE

## 2022-03-02 ENCOUNTER — Ambulatory Visit (INDEPENDENT_AMBULATORY_CARE_PROVIDER_SITE_OTHER): Payer: Medicaid Other

## 2022-03-02 ENCOUNTER — Ambulatory Visit: Payer: Medicaid Other | Admitting: Podiatry

## 2022-03-02 ENCOUNTER — Encounter: Payer: Self-pay | Admitting: Podiatry

## 2022-03-02 DIAGNOSIS — M19071 Primary osteoarthritis, right ankle and foot: Secondary | ICD-10-CM | POA: Diagnosis not present

## 2022-03-02 DIAGNOSIS — M79671 Pain in right foot: Secondary | ICD-10-CM

## 2022-03-02 DIAGNOSIS — M79672 Pain in left foot: Secondary | ICD-10-CM

## 2022-03-02 DIAGNOSIS — M205X1 Other deformities of toe(s) (acquired), right foot: Secondary | ICD-10-CM | POA: Diagnosis not present

## 2022-03-02 MED ORDER — MELOXICAM 15 MG PO TABS: 15.0000 mg | ORAL_TABLET | Freq: Every day | ORAL | 0 refills | Status: DC

## 2022-03-02 NOTE — Progress Notes (Signed)
  Subjective:  Patient ID: Carmen Johnston, female    DOB: 21-Dec-1980,   MRN: [id number]  Chief Complaint  Patient presents with   Foot Pain    Right foot pain on going for 2 years. Mostly at the top of the foot and around her bunion area     42 y.o. female presents for concern of right foot pain that has been going on for about two years. Relates when pressure is applied when she is walking. She works long hours at work and hurts often after work. The only thing that has helped is rest. States the pain is mostly on the top of the foot near her big toe joint.  Denies any other pedal complaints. Denies n/v/f/c.   Past Medical History:  Diagnosis Date   Anemia    Anxiety    CIN II (cervical intraepithelial neoplasia II)    GERD (gastroesophageal reflux disease)    History of chlamydia    History of COVID-19 2022   per pt mild symptoms that resolved   History of scabies    Urgent care  visit 08-16-2021  in epic ---  dx scabies ,  treated with prescription cream    (08-31-2021  pt denies any skin rash)   History of trichomonal vaginitis    hx recurrent ,  last episode 03-11-2021   Hx of abnormal cervical Pap smear 12/23/2020   ASC-H/AGC/HPV pos. Had colposcopy in office on 03/15/21, Hysteroscopy, D&C on 03/16/21    Objective:  Physical Exam: Vascular: DP/PT pulses 2/4 bilateral. CFT <3 seconds. Normal hair growth on digits. No edema.  Skin. No lacerations or abrasions bilateral feet.  Musculoskeletal: MMT 5/5 bilateral lower extremities in DF, PF, Inversion and Eversion. Deceased ROM in DF of ankle joint.  Neurological: Sensation intact to light touch.   Assessment:   1. Hallux limitus, right   2. Arthritis of right midfoot      Plan:  Patient was evaluated and treated and all questions answered. -Xrays reviewed. No acute fractures or dislocations. Degenerative changes noted to right first MPJ with joint space narrowing and mild spurring noted to dorsum of first metatarsal head.  Some degenrative changes noted to third and fourth and some to second TMTJ as well.  -Discussed hallux limitus and midfoot arthritis and  treatement options; conservative and  Surgical management; risks, benefits, alternatives discussed. All patient's questions answered. -Rx Meloxicam provided. Reviewed CMP no concerns.  -defer injection today.  -Recommend continue with good supportive shoes and inserts. Discussed stiff soled shoes and use of inserts. Patient will come back at another time for possible power steps.  -Patient to return to office as needed or sooner if condition worsens.   Lorenda Peck, DPM

## 2022-03-28 ENCOUNTER — Encounter: Payer: Self-pay | Admitting: Obstetrics & Gynecology

## 2022-03-28 ENCOUNTER — Ambulatory Visit: Payer: Medicaid Other | Admitting: Obstetrics & Gynecology

## 2022-06-01 ENCOUNTER — Ambulatory Visit (INDEPENDENT_AMBULATORY_CARE_PROVIDER_SITE_OTHER): Payer: Medicaid Other | Admitting: Podiatry

## 2022-06-01 DIAGNOSIS — Z91199 Patient's noncompliance with other medical treatment and regimen due to unspecified reason: Secondary | ICD-10-CM

## 2022-06-01 NOTE — Progress Notes (Signed)
No show

## 2022-06-06 ENCOUNTER — Emergency Department (HOSPITAL_COMMUNITY): Payer: Medicaid Other

## 2022-06-06 ENCOUNTER — Other Ambulatory Visit: Payer: Self-pay

## 2022-06-06 ENCOUNTER — Emergency Department (HOSPITAL_COMMUNITY)
Admission: EM | Admit: 2022-06-06 | Discharge: 2022-06-06 | Discharge status: Against Medical Advice | Payer: Medicaid Other | Attending: Student | Admitting: Student

## 2022-06-06 ENCOUNTER — Encounter (HOSPITAL_COMMUNITY): Payer: Self-pay

## 2022-06-06 DIAGNOSIS — Z5321 Procedure and treatment not carried out due to patient leaving prior to being seen by health care provider: Secondary | ICD-10-CM | POA: Diagnosis not present

## 2022-06-06 DIAGNOSIS — R079 Chest pain, unspecified: Secondary | ICD-10-CM | POA: Insufficient documentation

## 2022-06-06 LAB — CBC
HCT: 35 % — ABNORMAL LOW (ref 36.0–46.0)
Hemoglobin: 10.4 g/dL — ABNORMAL LOW (ref 12.0–15.0)
MCH: 23.4 pg — ABNORMAL LOW (ref 26.0–34.0)
MCHC: 29.7 g/dL — ABNORMAL LOW (ref 30.0–36.0)
MCV: 78.7 fL — ABNORMAL LOW (ref 80.0–100.0)
Platelets: 260 10*3/uL (ref 150–400)
RBC: 4.45 MIL/uL (ref 3.87–5.11)
RDW: 15.9 % — ABNORMAL HIGH (ref 11.5–15.5)
WBC: 5.2 10*3/uL (ref 4.0–10.5)
nRBC: 0 % (ref 0.0–0.2)

## 2022-06-06 LAB — BASIC METABOLIC PANEL
Anion gap: 8 (ref 5–15)
BUN: 9 mg/dL (ref 6–20)
CO2: 22 mmol/L (ref 22–32)
Calcium: 9.4 mg/dL (ref 8.9–10.3)
Chloride: 104 mmol/L (ref 98–111)
Creatinine, Ser: 0.84 mg/dL (ref 0.44–1.00)
GFR, Estimated: >60 mL/min (ref >60)
Glucose, Bld: 92 mg/dL (ref 70–99)
Potassium: 3.9 mmol/L (ref 3.5–5.1)
Sodium: 134 mmol/L — ABNORMAL LOW (ref 135–145)

## 2022-06-06 LAB — TROPONIN I (HIGH SENSITIVITY)
Troponin I (High Sensitivity): 2 ng/L (ref <18)
Troponin I (High Sensitivity): <2 ng/L (ref <18)

## 2022-06-06 LAB — I-STAT BETA HCG BLOOD, ED (MC, WL, AP ONLY): I-stat hCG, quantitative: <5 m[IU]/mL (ref <5)

## 2022-06-06 NOTE — ED Triage Notes (Signed)
Pt started having CP yesterday while getting ready for church. Pain is intermittent and pt is not having it right now. Pain is right side and radiates to left neck. CP is dull. Denies SOB

## 2022-06-06 NOTE — ED Notes (Signed)
Pt stated that she had to leave couldn't wait any longer

## 2022-08-09 DIAGNOSIS — Z1159 Encounter for screening for other viral diseases: Secondary | ICD-10-CM | POA: Diagnosis not present

## 2022-08-09 DIAGNOSIS — Z6829 Body mass index (BMI) 29.0-29.9, adult: Secondary | ICD-10-CM | POA: Diagnosis not present

## 2022-08-09 DIAGNOSIS — R5383 Other fatigue: Secondary | ICD-10-CM | POA: Diagnosis not present

## 2022-08-09 DIAGNOSIS — R718 Other abnormality of red blood cells: Secondary | ICD-10-CM | POA: Diagnosis not present

## 2022-08-09 DIAGNOSIS — Z3200 Encounter for pregnancy test, result unknown: Secondary | ICD-10-CM | POA: Diagnosis not present

## 2022-08-09 DIAGNOSIS — Z0000 Encounter for general adult medical examination without abnormal findings: Secondary | ICD-10-CM | POA: Diagnosis not present

## 2022-08-09 DIAGNOSIS — Z0130 Encounter for examination of blood pressure without abnormal findings: Secondary | ICD-10-CM | POA: Diagnosis not present

## 2022-08-12 DIAGNOSIS — Z3200 Encounter for pregnancy test, result unknown: Secondary | ICD-10-CM | POA: Diagnosis not present

## 2022-08-12 DIAGNOSIS — G514 Facial myokymia: Secondary | ICD-10-CM | POA: Diagnosis not present

## 2022-08-12 DIAGNOSIS — M545 Low back pain, unspecified: Secondary | ICD-10-CM | POA: Diagnosis not present

## 2022-08-12 DIAGNOSIS — R030 Elevated blood-pressure reading, without diagnosis of hypertension: Secondary | ICD-10-CM | POA: Diagnosis not present

## 2022-08-12 DIAGNOSIS — Z6828 Body mass index (BMI) 28.0-28.9, adult: Secondary | ICD-10-CM | POA: Diagnosis not present

## 2022-08-12 DIAGNOSIS — Z0130 Encounter for examination of blood pressure without abnormal findings: Secondary | ICD-10-CM | POA: Diagnosis not present

## 2023-02-09 ENCOUNTER — Emergency Department (HOSPITAL_COMMUNITY)
Admission: EM | Admit: 2023-02-09 | Discharge: 2023-02-10 | Disposition: A | Discharge status: Home / Self Care | Payer: Medicaid Other | Attending: Emergency Medicine | Admitting: Emergency Medicine

## 2023-02-09 ENCOUNTER — Ambulatory Visit (HOSPITAL_COMMUNITY)
Admission: EM | Admit: 2023-02-09 | Discharge: 2023-02-09 | Discharge status: Against Medical Advice | Payer: Medicaid Other

## 2023-02-09 ENCOUNTER — Encounter (HOSPITAL_COMMUNITY): Payer: Self-pay | Admitting: Emergency Medicine

## 2023-02-09 DIAGNOSIS — R002 Palpitations: Secondary | ICD-10-CM | POA: Diagnosis not present

## 2023-02-09 DIAGNOSIS — R202 Paresthesia of skin: Secondary | ICD-10-CM | POA: Diagnosis not present

## 2023-02-09 DIAGNOSIS — R5383 Other fatigue: Secondary | ICD-10-CM | POA: Diagnosis not present

## 2023-02-09 DIAGNOSIS — Z20822 Contact with and (suspected) exposure to covid-19: Secondary | ICD-10-CM | POA: Diagnosis not present

## 2023-02-09 DIAGNOSIS — R531 Weakness: Secondary | ICD-10-CM | POA: Diagnosis not present

## 2023-02-09 LAB — CBC
HCT: 39.2 % (ref 36.0–46.0)
Hemoglobin: 12.1 g/dL (ref 12.0–15.0)
MCH: 26 pg (ref 26.0–34.0)
MCHC: 30.9 g/dL (ref 30.0–36.0)
MCV: 84.3 fL (ref 80.0–100.0)
Platelets: 230 10*3/uL (ref 150–400)
RBC: 4.65 MIL/uL (ref 3.87–5.11)
RDW: 13.1 % (ref 11.5–15.5)
WBC: 4.7 10*3/uL (ref 4.0–10.5)
nRBC: 0 % (ref 0.0–0.2)

## 2023-02-09 LAB — TROPONIN I (HIGH SENSITIVITY): Troponin I (High Sensitivity): <2 ng/L (ref <18)

## 2023-02-09 LAB — BASIC METABOLIC PANEL
Anion gap: 10 (ref 5–15)
BUN: 8 mg/dL (ref 6–20)
CO2: 22 mmol/L (ref 22–32)
Calcium: 9.2 mg/dL (ref 8.9–10.3)
Chloride: 105 mmol/L (ref 98–111)
Creatinine, Ser: 0.85 mg/dL (ref 0.44–1.00)
GFR, Estimated: >60 mL/min (ref >60)
Glucose, Bld: 81 mg/dL (ref 70–99)
Potassium: 3.9 mmol/L (ref 3.5–5.1)
Sodium: 137 mmol/L (ref 135–145)

## 2023-02-09 NOTE — ED Triage Notes (Signed)
 Pt reports generalized weakness, dizziness, and palpitations. Denies fevers and denies n/v/d.

## 2023-02-09 NOTE — ED Provider Triage Note (Signed)
 Emergency Medicine Provider Triage Evaluation Note  TYRELL BRERETON , a 43 y.o. female  was evaluated in triage.  Pt complains of weakness.  Review of Systems  Positive: Fatigue, tingling in hands, palpitations Negative: Shortness of breath, nausea, vomiting, diarrhea, abdominal pain, vision changes  Physical Exam  BP 127/83 (BP Location: Right Arm)   Pulse 77   Temp 98.6 F (37 C)   Resp 18   SpO2 100%  Gen:   Awake, no distress   Resp:  Normal effort  MSK:   Moves extremities without difficulty  Other:    Medical Decision Making  Medically screening exam initiated at 3:07 PM.  Appropriate orders placed.  MOSELLE RISTER was informed that the remainder of the evaluation will be completed by another provider, this initial triage assessment does not replace that evaluation, and the importance of remaining in the ED until their evaluation is complete.  Labs ordered   Francis Ileana LOISE, [place] 02/09/23 [phone]

## 2023-02-10 LAB — URINALYSIS, ROUTINE W REFLEX MICROSCOPIC
Bilirubin Urine: NEGATIVE
Glucose, UA: NEGATIVE mg/dL
Hgb urine dipstick: NEGATIVE
Ketones, ur: NEGATIVE mg/dL
Leukocytes,Ua: NEGATIVE
Nitrite: NEGATIVE
Protein, ur: NEGATIVE mg/dL
Specific Gravity, Urine: 1.018 (ref 1.005–1.030)
pH: 5 (ref 5.0–8.0)

## 2023-02-10 LAB — RESP PANEL BY RT-PCR (RSV, FLU A&B, COVID)  RVPGX2
Influenza A by PCR: NEGATIVE
Influenza B by PCR: NEGATIVE
Resp Syncytial Virus by PCR: NEGATIVE
SARS Coronavirus 2 by RT PCR: NEGATIVE

## 2023-02-10 LAB — TSH: TSH: 1.822 u[IU]/mL (ref 0.350–4.500)

## 2023-02-10 NOTE — ED Provider Notes (Signed)
 [facility] Emergency Department Provider Note MRN:  [id number]  Arrival date & time: 02/10/23     Chief Complaint   Weakness   History of Present Illness   Carmen Johnston is a 43 y.o. year-old female presents to the ED with chief complaint of generalized weakness.  She states that she has felt very fatigued.  States that she sometimes has palpitations.  States that she has had some tingling in bilateral fingers for the past few days.  She denies fever, chills, cough, chest pain, SOB, abdominal pain, or dysuria.  She states that she drinks a lot of coffee, but hasn't been drinking coffee for the past couple of days.  History provided by patient.   Review of Systems  Pertinent positive and negative review of systems noted in HPI.    Physical Exam   Vitals:   02/10/23 0004 02/10/23 0100  BP: 112/72 121/77  Pulse: 60 (!) 57  Resp: 18   Temp: 98.6 F (37 C)   SpO2: 100% 98%    CONSTITUTIONAL:  non toxic-appearing, NAD NEURO:  Alert and oriented x 3, CN 3-12 grossly intact, movements are goal oriented EYES:  eyes equal and reactive ENT/NECK:  Supple, no stridor  CARDIO:  normal rate, regular rhythm, appears well-perfused  PULM:  No respiratory distress, CTAB GI/GU:  non-distended,  MSK/SPINE:  No gross deformities, no edema, moves all extremities  SKIN:  no rash, atraumatic   *Additional and/or pertinent findings included in MDM below  Diagnostic and Interventional Summary    EKG Interpretation Date/Time:    Ventricular Rate:    PR Interval:    QRS Duration:    QT Interval:    QTC Calculation:   R Axis:      Text Interpretation:         Labs Reviewed  URINALYSIS, ROUTINE W REFLEX MICROSCOPIC - Abnormal; Notable for the following components:      Result Value   APPearance HAZY (*)    All other components within normal limits  RESP PANEL BY RT-PCR (RSV, FLU A&B, COVID)  RVPGX2  BASIC METABOLIC PANEL  CBC  TSH  TROPONIN I (HIGH  SENSITIVITY)    No orders to display    Medications - No data to display   Procedures  /  Critical Care Procedures  ED Course and Medical Decision Making  I have reviewed the triage vital signs, the nursing notes, and pertinent available records from the EMR.  Social Determinants Affecting Complexity of Care: Patient has no clinically significant social determinants affecting this chief complaint..   ED Course:    Medical Decision Making The patient here with complaints of generalized fatigue/weakness over the past couple of days.  She states that she normally drinks a lot of coffee, and has not been drinking coffee over the past 2 days.  Question whether decreased caffeine intake could be contributing to her symptoms.  She denies fevers or chills.  COVID and flu test are negative.  She is not having any pain.  Laboratory workup is reassuring.  No anemia, no leukocytosis, no electrolyte abnormalities.  I added on a TSH, which was also normal.  EKG is notable for normal sinus rhythm, rate of 82, normal QT.  Given reassuring workup, I don't think that patient needs any additional workup tonight.  She has good strength in her extremities.  Normal speech and vision.  Reassuring neuro exam.   Will have patient follow-up outpatient.  Return precautions discussed.  Amount  and/or Complexity of Data Reviewed Labs: ordered.         Consultants: No consultations were needed in caring for this patient.   Treatment and Plan: Emergency department workup does not suggest an emergent condition requiring admission or immediate intervention beyond  what has been performed at this time. The patient is safe for discharge and has  been instructed to return immediately for worsening symptoms, change in  symptoms or any other concerns    Final Clinical Impressions(s) / ED Diagnoses     ICD-10-CM   1. Generalized weakness  R53.1     2. Palpitations  R00.2       ED Discharge Orders      None         Discharge Instructions Discussed with and Provided to Patient:     Discharge Instructions      Your tests look good tonight.  Please follow-up with your primary care doctor, as additional workup may be needed.  Return for new or worsening symptoms.       Vicky Charleston, PA-C 02/10/23 0154    Jerrol Agent, MD 02/10/23 [phone]

## 2023-02-10 NOTE — Discharge Instructions (Addendum)
 Your tests look good tonight.  Please follow-up with your primary care doctor, as additional workup may be needed.  Return for new or worsening symptoms.

## 2023-04-14 ENCOUNTER — Encounter (HOSPITAL_COMMUNITY): Payer: Self-pay | Admitting: Emergency Medicine

## 2023-04-14 ENCOUNTER — Ambulatory Visit (HOSPITAL_COMMUNITY)
Admission: EM | Admit: 2023-04-14 | Discharge: 2023-04-14 | Disposition: A | Discharge status: Home / Self Care | Attending: Emergency Medicine | Admitting: Emergency Medicine

## 2023-04-14 DIAGNOSIS — Z3202 Encounter for pregnancy test, result negative: Secondary | ICD-10-CM

## 2023-04-14 DIAGNOSIS — R350 Frequency of micturition: Secondary | ICD-10-CM | POA: Insufficient documentation

## 2023-04-14 DIAGNOSIS — Z113 Encounter for screening for infections with a predominantly sexual mode of transmission: Secondary | ICD-10-CM | POA: Diagnosis not present

## 2023-04-14 LAB — POCT FASTING CBG KUC MANUAL ENTRY: POCT Glucose (KUC): 93 mg/dL (ref 70–99)

## 2023-04-14 LAB — POCT URINALYSIS DIP (MANUAL ENTRY)
Bilirubin, UA: NEGATIVE
Glucose, UA: NEGATIVE mg/dL
Ketones, POC UA: NEGATIVE mg/dL
Leukocytes, UA: NEGATIVE
Nitrite, UA: NEGATIVE
Protein Ur, POC: NEGATIVE mg/dL
Spec Grav, UA: 1.02 (ref 1.010–1.025)
Urobilinogen, UA: 0.2 U/dL
pH, UA: 6.5 (ref 5.0–8.0)

## 2023-04-14 LAB — POCT URINE PREGNANCY: Preg Test, Ur: NEGATIVE

## 2023-04-14 LAB — HIV ANTIBODY (ROUTINE TESTING W REFLEX): HIV Screen 4th Generation wRfx: NONREACTIVE

## 2023-04-14 NOTE — ED Triage Notes (Signed)
 Reports having urinary frequency for while but thought related to drinking a lot. For 2-3 days having discomfort after urinates. Denies blood in urine or burning with urination. Reports urine may be a little cloudy.

## 2023-04-14 NOTE — Discharge Instructions (Signed)
 Your blood glucose level was normal in clinic today and therefore I do not believe this is related to your urinary frequency. I have sent a urine culture to confirm the presence of urinary tract infection.  Your swab and blood work results will return over the next few days and we will call if results are positive and require any treatment.  Return here as needed.

## 2023-04-14 NOTE — ED Provider Notes (Signed)
 [facility]    CSN: [id number] Arrival date & time: 04/14/23  1119      History   Chief Complaint Chief Complaint  Patient presents with   Urinary Frequency    HPI Carmen Johnston is a 43 y.o. female.   Patient presents with urinary frequency for "a while", but states that she thought it might be related to drinking lots of water recently.  Patient endorses some mild vaginal discomfort and lower abdominal discomfort with urination x 2 to 3 days.  Denies hematuria, burning with urination, urinary urgency, flank pain, and fever.  Patient denies obvious abnormal vaginal discharge, but states she may have had a recent increase in vaginal discharge.  Denies abnormal vaginal bleeding.  Patient states she has not been sexually active in 3 months, but denies any testing since then and is requesting STD testing today.  LMP 03/18/2023.   Urinary Frequency    Past Medical History:  Diagnosis Date   Anemia    Anxiety    CIN II (cervical intraepithelial neoplasia II)    GERD (gastroesophageal reflux disease)    History of chlamydia    History of COVID-19 2022   per pt mild symptoms that resolved   History of scabies    Urgent care  visit 08-16-2021  in epic ---  dx scabies ,  treated with prescription cream    (08-31-2021  pt denies any skin rash)   History of trichomonal vaginitis    hx recurrent ,  last episode 03-11-2021   Hx of abnormal cervical Pap smear 12/23/2020   ASC-H/AGC/HPV pos. Had colposcopy in office on 03/15/21, Hysteroscopy, D&C on 03/16/21    Patient Active Problem List   Diagnosis Date Noted   Dysplasia of cervix, high grade CIN 2 on 03/05/2021 colposcopy biopsy 03/21/2021   Atypical cervical glandular cells on 12/23/2020 pap smear 03/14/2021   Atypical squamous cells cannot exclude high grade squamous intraepithelial lesion (ASC-H) on 12/23/2020 pap smear 03/14/2021   Cervical high risk HPV (human papillomavirus) test positive on 12/23/2020 pap smear  03/14/2021    Past Surgical History:  Procedure Laterality Date   HYSTEROSCOPY WITH D & C N/A 03/16/2021   Procedure: DILATATION AND CURETTAGE /HYSTEROSCOPY;  Surgeon: Julianne Octave, MD;  Location: [facility];  Service: Gynecology;  Laterality: N/A;   LEEP N/A 09/14/2021   Procedure: LOOP ELECTROSURGICAL EXCISION PROCEDURE (LEEP);  Surgeon: Julianne Octave, MD;  Location: [facility];  Service: Gynecology;  Laterality: N/A;   WISDOM TOOTH EXTRACTION     teen    OB History     Gravida  9   Para  4   Term  4   Preterm  0   AB  3   Living  4      SAB  1   IAB  1   Ectopic  1   Multiple      Live Births  4            Home Medications    Prior to Admission medications   Medication Sig Start Date End Date Taking? Authorizing Provider  calcium carbonate (TUMS - DOSED IN MG ELEMENTAL CALCIUM) 500 MG chewable tablet Chew 1 tablet by mouth as needed for indigestion or heartburn.    [provider]  Cholecalciferol (VITAMIN D-3 PO) Take 1 tablet by mouth daily.    [provider]  ibuprofen (ADVIL) 600 MG tablet Take 1 tablet (600 mg total) by  mouth every 6 (six) hours as needed for headache, mild pain, moderate pain or cramping. 09/14/21   Anyanwu, Ugonna A, MD  Multiple Vitamins-Minerals (CENTRUM ADULTS PO) Take 1 tablet by mouth daily.    [provider]  fluticasone (FLONASE) 50 MCG/ACT nasal spray Place 2 sprays into both nostrils daily. 01/16/20 04/15/20  Daved Eriksson, PA-C  omeprazole (PRILOSEC) 40 MG capsule Take 1 capsule (40 mg total) by mouth daily. 04/16/18 11/26/18  Albertina Hugger, MD    Family History Family History  Problem Relation Age of Onset   Mental illness Mother    Anesthesia problems Neg Hx    Stomach cancer Neg Hx    Colon cancer Neg Hx    Pancreatic cancer Neg Hx     Social History Social History   Tobacco Use   Smoking status: Former    Current packs/day: 0.00     Average packs/day: 0.3 packs/day for 10.0 years (2.5 ttl pk-yrs)    Types: Cigarettes    Start date: 01/05/2007    Quit date: 01/04/2017    Years since quitting: 6.2   Smokeless tobacco: Never  Vaping Use   Vaping status: Never Used  Substance Use Topics   Alcohol use: Yes    Comment: occasionally   Drug use: Not Currently    Types: Marijuana     Allergies   Flagyl [metronidazole]   Review of Systems Review of Systems  Genitourinary:  Positive for frequency.   Per HPI  Physical Exam Triage Vital Signs ED Triage Vitals  Encounter Vitals Group     BP 04/14/23 1229 118/80     Systolic BP Percentile --      Diastolic BP Percentile --      Pulse Rate 04/14/23 1229 74     Resp 04/14/23 1229 17     Temp 04/14/23 1229 98.2 F (36.8 C)     Temp Source 04/14/23 1229 Oral     SpO2 04/14/23 1229 99 %     Weight --      Height --      Head Circumference --      Peak Flow --      Pain Score 04/14/23 1227 0     Pain Loc --      Pain Education --      Exclude from Growth Chart --    No data found.  Updated Vital Signs BP 118/80 (BP Location: Right Arm)   Pulse 74   Temp 98.2 F (36.8 C) (Oral)   Resp 17   LMP 03/18/2023 (Approximate)   SpO2 99%   Visual Acuity Right Eye Distance:   Left Eye Distance:   Bilateral Distance:    Right Eye Near:   Left Eye Near:    Bilateral Near:     Physical Exam Vitals and nursing note reviewed.  Constitutional:      General: She is awake. She is not in acute distress.    Appearance: Normal appearance. She is well-developed and well-groomed. She is not ill-appearing.  Abdominal:     Palpations: Abdomen is soft.     Tenderness: There is no abdominal tenderness. There is no right CVA tenderness or left CVA tenderness.  Genitourinary:    Comments: Exam deferred Skin:    General: Skin is warm and dry.  Neurological:     Mental Status: She is alert.  Psychiatric:        Behavior: Behavior is cooperative.      UC  Treatments  / Results  Labs (all labs ordered are listed, but only abnormal results are displayed) Labs Reviewed  POCT URINALYSIS DIP (MANUAL ENTRY) - Abnormal; Notable for the following components:      Result Value   Blood, UA trace-intact (*)    All other components within normal limits  URINE CULTURE  HIV ANTIBODY (ROUTINE TESTING W REFLEX)  RPR  POCT URINE PREGNANCY  POCT FASTING CBG [facility] MANUAL ENTRY  CERVICOVAGINAL ANCILLARY ONLY    EKG   Radiology No results found.  Procedures Procedures (including critical care time)  Medications Ordered in UC Medications - No data to display  Initial Impression / Assessment and Plan / UC Course  I have reviewed the triage vital signs and the nursing notes.  Pertinent labs & imaging results that were available during my care of the patient were reviewed by me and considered in my medical decision making (see chart for details).     Patient is well-appearing.  Vitals are stable.  No significant findings upon exam.  GU exam deferred.  Patient perform self swab for STD/STI.  Urinalysis only revealed a trace intact RBCs, will send urine culture to confirm no presence of urinary tract infection.  HIV and RPR ordered.  CBG was normal in clinic.  Discussed follow-up and return precautions. Final Clinical Impressions(s) / UC Diagnoses   Final diagnoses:  Urinary frequency  Screening for STD (sexually transmitted disease)     Discharge Instructions      Your blood glucose level was normal in clinic today and therefore I do not believe this is related to your urinary frequency. I have sent a urine culture to confirm the presence of urinary tract infection.  Your swab and blood work results will return over the next few days and we will call if results are positive and require any treatment.  Return here as needed.   ED Prescriptions   None    PDMP not reviewed this encounter.   Levora Reas A, NP 04/14/23 1354

## 2023-04-15 LAB — RPR: RPR Ser Ql: NONREACTIVE

## 2023-04-15 LAB — URINE CULTURE: Culture: 40000 — AB

## 2023-04-18 LAB — CERVICOVAGINAL ANCILLARY ONLY
Bacterial Vaginitis (gardnerella): NEGATIVE
Candida Glabrata: NEGATIVE
Candida Vaginitis: NEGATIVE
Chlamydia: NEGATIVE
Comment: NEGATIVE
Comment: NEGATIVE
Comment: NEGATIVE
Comment: NEGATIVE
Comment: NEGATIVE
Comment: NORMAL
Neisseria Gonorrhea: NEGATIVE
Trichomonas: NEGATIVE

## 2023-06-18 ENCOUNTER — Encounter: Payer: Self-pay | Admitting: Family Medicine

## 2023-06-18 ENCOUNTER — Ambulatory Visit: Admitting: Family Medicine

## 2023-06-18 ENCOUNTER — Other Ambulatory Visit (HOSPITAL_COMMUNITY)
Admission: RE | Admit: 2023-06-18 | Discharge: 2023-06-18 | Disposition: A | Discharge status: Home / Self Care | Source: Ambulatory Visit | Attending: Family Medicine | Admitting: Family Medicine

## 2023-06-18 VITALS — BP 121/79 | HR 65 | Ht 68.0 in | Wt 186.0 lb

## 2023-06-18 DIAGNOSIS — Z30011 Encounter for initial prescription of contraceptive pills: Secondary | ICD-10-CM

## 2023-06-18 DIAGNOSIS — Z01419 Encounter for gynecological examination (general) (routine) without abnormal findings: Secondary | ICD-10-CM | POA: Diagnosis not present

## 2023-06-18 DIAGNOSIS — G43109 Migraine with aura, not intractable, without status migrainosus: Secondary | ICD-10-CM

## 2023-06-18 MED ORDER — NORETHINDRONE 0.35 MG PO TABS: 1.0000 | ORAL_TABLET | Freq: Every day | ORAL | 3 refills | Status: AC

## 2023-06-18 NOTE — Progress Notes (Unsigned)
 Leep was done in OR 09/14/2021

## 2023-06-19 NOTE — Progress Notes (Signed)
 GYNECOLOGY ANNUAL PREVENTATIVE CARE ENCOUNTER NOTE  Subjective:  Carmen Johnston is a 43 y.o. [phone] female here for a routine annual gynecologic exam.  Current complaints: here for annual but also with premenstrual sx, she notes more changes in mood for the past year about 1 week before menses.   She would describe her mood as depression and it is predictable within 1 week of her period. She reports she is able to work through it but notes it is is more severe recently.  She also reports migraines about 2 days before cycle onset. She reports +aura and left sided weakness with migraines.   Menses are regular periods every 28 days Having perimenopausal sx? Yes Current sx are: mood changes with wildly fluctuating moods, migraines    Denies abnormal vaginal bleeding, discharge, pelvic pain, problems with intercourse or other gynecologic concerns.    Gynecologic History Patient's last menstrual period was 05/17/2023 (approximate). Contraception: none Last Pap: 2022. Results were: ASCU H--> LEEP (CIN2 with clear margins 09/2021)--> never had follow up paps q 6 months.  Last mammogram: 2023. Results were: normal  Health Maintenance Due  Topic Date Due   HPV VACCINES (1 - 3-dose series) Never done   Hepatitis C Screening  Never done   Cervical Cancer Screening (Pap smear)  Never done   DTaP/Tdap/Td (2 - Td or Tdap) 07/20/2021   COVID-19 Vaccine (1 - 2024-25 season) Never done    The following portions of the patient's history were reviewed and updated as appropriate: allergies, current medications, past family history, past medical history, past social history, past surgical history and problem list.  Review of Systems Pertinent items are noted in HPI.   Objective:  BP 121/79   Pulse 65   Ht 5' 8 (1.727 m)   Wt 186 lb (84.4 kg)   LMP 05/17/2023 (Approximate)   BMI 28.28 kg/m  CONSTITUTIONAL: Well-developed, well-nourished female in no acute distress.  HENT:  Normocephalic,  atraumatic, External right and left ear normal. Oropharynx is clear and moist EYES:  No scleral icterus.  NECK: Normal range of motion, supple, no masses.  Normal thyroid .  SKIN: Skin is warm and dry. No rash noted. Not diaphoretic. No erythema. No pallor. NEUROLOGIC: Alert and oriented to person, place, and time. Normal reflexes, muscle tone coordination. No cranial nerve deficit noted. PSYCHIATRIC: Normal mood and affect. Normal behavior. Normal judgment and thought content. CARDIOVASCULAR: Normal heart rate noted, regular rhythm. 2+ distal pulses. RESPIRATORY: Effort and breath sounds normal, no problems with respiration noted. BREASTS: Symmetric in size. No masses, skin changes, nipple drainage, or lymphadenopathy. ABDOMEN: Soft,  no distention noted.  No tenderness, rebound or guarding.  PELVIC: Normal appearing external genitalia; normal appearing vaginal mucosa and cervix.  No abnormal discharge noted.  Pap smear obtained.  Normal uterine size, no other palpable masses, no uterine or adnexal tenderness. Chaperone present for exam MUSCULOSKELETAL: Normal range of motion.    Assessment and Plan:  1) Annual gynecologic examination with pap smear:  Will follow up results of pap smear and manage accordingly. STI screening desired No.  Routine preventative health maintenance measures emphasized. Reviewed perimenopausal symptoms and management.   2) Contraception counseling: Reviewed use of hormonal pills to help control/lessen PMDD  1. Well woman exam with routine gynecological exam (Primary) - Cytology - PAP - MM 3D SCREENING MAMMOGRAM BILATERAL BREAST; Future - norethindrone (MICRONOR) 0.35 MG tablet; Take 1 tablet (0.35 mg total) by mouth daily.  Dispense: 84 tablet; Refill: 3  2. Encounter for initial prescription of contraceptive pills  Sent in POP to help with hormonal control and with mood sx/possibly migraines Reviewed to stop if worsening of migraines.   3. Migraine with aura  and without status migrainosus, not intractable Complex migraines, contraindication for estrogen containing pills   Please refer to After Visit Summary for other counseling recommendations.   Return in about 1 year (around 06/17/2024) for Yearly wellness exam.  Abner Ables, MD, MPH, ABFM Attending [facility]

## 2023-06-21 LAB — CYTOLOGY - PAP
Adequacy: ABSENT
Chlamydia: NEGATIVE
Comment: NEGATIVE
Comment: NEGATIVE
Comment: NEGATIVE
Comment: NORMAL
Diagnosis: REACTIVE
High risk HPV: NEGATIVE
Neisseria Gonorrhea: NEGATIVE
Trichomonas: NEGATIVE

## 2023-06-23 ENCOUNTER — Ambulatory Visit: Payer: Self-pay | Admitting: Family Medicine

## 2023-07-03 ENCOUNTER — Other Ambulatory Visit: Payer: Self-pay | Admitting: Medical Genetics

## 2023-07-10 ENCOUNTER — Ambulatory Visit
Admission: RE | Admit: 2023-07-10 | Discharge: 2023-07-10 | Disposition: A | Discharge status: Home / Self Care | Source: Ambulatory Visit | Attending: Family Medicine | Admitting: Family Medicine

## 2023-07-10 DIAGNOSIS — Z1231 Encounter for screening mammogram for malignant neoplasm of breast: Secondary | ICD-10-CM | POA: Diagnosis not present

## 2023-07-10 DIAGNOSIS — Z01419 Encounter for gynecological examination (general) (routine) without abnormal findings: Secondary | ICD-10-CM

## 2023-07-12 ENCOUNTER — Other Ambulatory Visit: Payer: Self-pay

## 2023-08-29 ENCOUNTER — Emergency Department (HOSPITAL_COMMUNITY)
Admission: EM | Admit: 2023-08-29 | Discharge: 2023-08-29 | Discharge status: Against Medical Advice | Attending: Emergency Medicine | Admitting: Emergency Medicine

## 2023-08-29 ENCOUNTER — Other Ambulatory Visit: Payer: Self-pay

## 2023-08-29 ENCOUNTER — Emergency Department (HOSPITAL_COMMUNITY)

## 2023-08-29 ENCOUNTER — Encounter (HOSPITAL_COMMUNITY): Payer: Self-pay

## 2023-08-29 DIAGNOSIS — R42 Dizziness and giddiness: Secondary | ICD-10-CM | POA: Diagnosis not present

## 2023-08-29 DIAGNOSIS — R079 Chest pain, unspecified: Secondary | ICD-10-CM | POA: Insufficient documentation

## 2023-08-29 DIAGNOSIS — Z5321 Procedure and treatment not carried out due to patient leaving prior to being seen by health care provider: Secondary | ICD-10-CM | POA: Diagnosis not present

## 2023-08-29 DIAGNOSIS — R55 Syncope and collapse: Secondary | ICD-10-CM | POA: Insufficient documentation

## 2023-08-29 LAB — BASIC METABOLIC PANEL WITH GFR
Anion gap: 13 (ref 5–15)
BUN: 12 mg/dL (ref 6–20)
CO2: 22 mmol/L (ref 22–32)
Calcium: 9.6 mg/dL (ref 8.9–10.3)
Chloride: 103 mmol/L (ref 98–111)
Creatinine, Ser: 0.87 mg/dL (ref 0.44–1.00)
GFR, Estimated: >60 mL/min (ref >60)
Glucose, Bld: 87 mg/dL (ref 70–99)
Potassium: 3.9 mmol/L (ref 3.5–5.1)
Sodium: 138 mmol/L (ref 135–145)

## 2023-08-29 LAB — HCG, SERUM, QUALITATIVE: Preg, Serum: NEGATIVE

## 2023-08-29 LAB — CBC
HCT: 40.2 % (ref 36.0–46.0)
Hemoglobin: 11.8 g/dL — ABNORMAL LOW (ref 12.0–15.0)
MCH: 24.8 pg — ABNORMAL LOW (ref 26.0–34.0)
MCHC: 29.4 g/dL — ABNORMAL LOW (ref 30.0–36.0)
MCV: 84.5 fL (ref 80.0–100.0)
Platelets: 269 K/uL (ref 150–400)
RBC: 4.76 MIL/uL (ref 3.87–5.11)
RDW: 13.2 % (ref 11.5–15.5)
WBC: 4.2 K/uL (ref 4.0–10.5)
nRBC: 0 % (ref 0.0–0.2)

## 2023-08-29 LAB — TROPONIN T, HIGH SENSITIVITY: Troponin T High Sensitivity: <15 ng/L (ref 0–19)

## 2023-08-29 NOTE — ED Notes (Signed)
 Went to waiting room and called out for pt to collect another tube of blood. Pt was not in the waiting room. RN notified.

## 2023-08-29 NOTE — ED Triage Notes (Signed)
 Pt presents to ED from home C/O chest pain, lightheadedness since last night. Denies SOB, n/v.

## 2023-10-12 ENCOUNTER — Other Ambulatory Visit: Payer: Self-pay | Admitting: *Deleted

## 2023-10-12 DIAGNOSIS — Z006 Encounter for examination for normal comparison and control in clinical research program: Secondary | ICD-10-CM
